# Patient Record
Sex: Female | Born: 1990 | Race: Black or African American | Hispanic: No | Marital: Single | Smoking: Former smoker
Health system: Southern US, Community
[De-identification: ages and names within clinical notes are randomized; demographics above are authoritative.]

## PROBLEM LIST (undated history)

## (undated) ENCOUNTER — Inpatient Hospital Stay (HOSPITAL_COMMUNITY): Payer: Self-pay

## (undated) ENCOUNTER — Inpatient Hospital Stay (HOSPITAL_COMMUNITY): Payer: 59

## (undated) DIAGNOSIS — D6800 Von Willebrand disease, unspecified: Secondary | ICD-10-CM

## (undated) DIAGNOSIS — D649 Anemia, unspecified: Secondary | ICD-10-CM

## (undated) DIAGNOSIS — G43909 Migraine, unspecified, not intractable, without status migrainosus: Secondary | ICD-10-CM

## (undated) DIAGNOSIS — G971 Other reaction to spinal and lumbar puncture: Secondary | ICD-10-CM

## (undated) DIAGNOSIS — S62309A Unspecified fracture of unspecified metacarpal bone, initial encounter for closed fracture: Secondary | ICD-10-CM

## (undated) DIAGNOSIS — O039 Complete or unspecified spontaneous abortion without complication: Secondary | ICD-10-CM

## (undated) DIAGNOSIS — F329 Major depressive disorder, single episode, unspecified: Secondary | ICD-10-CM

## (undated) DIAGNOSIS — I10 Essential (primary) hypertension: Secondary | ICD-10-CM

## (undated) DIAGNOSIS — F4310 Post-traumatic stress disorder, unspecified: Secondary | ICD-10-CM

## (undated) DIAGNOSIS — F419 Anxiety disorder, unspecified: Secondary | ICD-10-CM

## (undated) DIAGNOSIS — Z8614 Personal history of Methicillin resistant Staphylococcus aureus infection: Secondary | ICD-10-CM

## (undated) DIAGNOSIS — F32A Depression, unspecified: Secondary | ICD-10-CM

## (undated) DIAGNOSIS — D500 Iron deficiency anemia secondary to blood loss (chronic): Secondary | ICD-10-CM

## (undated) DIAGNOSIS — N921 Excessive and frequent menstruation with irregular cycle: Secondary | ICD-10-CM

## (undated) HISTORY — DX: Von Willebrand's disease: D68.0

## (undated) HISTORY — DX: Excessive and frequent menstruation with irregular cycle: N92.1

## (undated) HISTORY — PX: FRACTURE SURGERY: SHX138

## (undated) HISTORY — DX: Iron deficiency anemia secondary to blood loss (chronic): D50.0

---
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
## 2010-01-19 DIAGNOSIS — Z8614 Personal history of Methicillin resistant Staphylococcus aureus infection: Secondary | ICD-10-CM

## 2010-01-19 HISTORY — DX: Personal history of Methicillin resistant Staphylococcus aureus infection: Z86.14

## 2012-12-06 ENCOUNTER — Inpatient Hospital Stay (HOSPITAL_COMMUNITY): Payer: PRIVATE HEALTH INSURANCE

## 2012-12-06 ENCOUNTER — Encounter (HOSPITAL_COMMUNITY): Payer: Self-pay | Admitting: *Deleted

## 2012-12-06 ENCOUNTER — Inpatient Hospital Stay (HOSPITAL_COMMUNITY)
Admission: AD | Admit: 2012-12-06 | Discharge: 2012-12-06 | Disposition: A | Discharge status: Home / Self Care | Payer: Self-pay | Source: Ambulatory Visit | Attending: Obstetrics & Gynecology | Admitting: Obstetrics & Gynecology

## 2012-12-06 DIAGNOSIS — O209 Hemorrhage in early pregnancy, unspecified: Secondary | ICD-10-CM | POA: Insufficient documentation

## 2012-12-06 DIAGNOSIS — Z3490 Encounter for supervision of normal pregnancy, unspecified, unspecified trimester: Secondary | ICD-10-CM

## 2012-12-06 DIAGNOSIS — Z1389 Encounter for screening for other disorder: Secondary | ICD-10-CM

## 2012-12-06 DIAGNOSIS — B9689 Other specified bacterial agents as the cause of diseases classified elsewhere: Secondary | ICD-10-CM | POA: Insufficient documentation

## 2012-12-06 DIAGNOSIS — O2390 Unspecified genitourinary tract infection in pregnancy, unspecified trimester: Secondary | ICD-10-CM | POA: Insufficient documentation

## 2012-12-06 DIAGNOSIS — N760 Acute vaginitis: Secondary | ICD-10-CM

## 2012-12-06 DIAGNOSIS — A499 Bacterial infection, unspecified: Secondary | ICD-10-CM | POA: Insufficient documentation

## 2012-12-06 LAB — URINALYSIS, ROUTINE W REFLEX MICROSCOPIC
Bilirubin Urine: NEGATIVE
Glucose, UA: NEGATIVE mg/dL
Ketones, ur: NEGATIVE mg/dL
Leukocytes, UA: NEGATIVE
Nitrite: NEGATIVE
Protein, ur: NEGATIVE mg/dL

## 2012-12-06 LAB — CBC
Hemoglobin: 14.4 g/dL (ref 12.0–15.0)
MCH: 29.5 pg (ref 26.0–34.0)
MCHC: 34.7 g/dL (ref 30.0–36.0)
Platelets: 324 10*3/uL (ref 150–400)
RDW: 12.8 % (ref 11.5–15.5)
WBC: 13.4 10*3/uL — ABNORMAL HIGH (ref 4.0–10.5)

## 2012-12-06 LAB — WET PREP, GENITAL: Yeast Wet Prep HPF POC: NONE SEEN

## 2012-12-06 LAB — HCG, QUANTITATIVE, PREGNANCY: hCG, Beta Chain, Quant, S: 54001 m[IU]/mL — ABNORMAL HIGH (ref <5)

## 2012-12-06 LAB — POCT PREGNANCY, URINE: Preg Test, Ur: POSITIVE — AB

## 2012-12-06 LAB — ABO/RH: ABO/RH(D): O POS

## 2012-12-06 MED ORDER — CLINDAMYCIN PHOSPHATE 100 MG VA SUPP: 100.0000 mg | Freq: Every day | VAGINAL | Status: DC

## 2012-12-06 NOTE — MAU Provider Note (Signed)
History     CSN: [id number]  Arrival date and time: 12/06/12 1554   First Provider Initiated Contact with Patient 12/06/12 1656      Chief Complaint  Patient presents with  . Threatened Miscarriage   HPI  [name] is a 22 y.o. female G2P0010 at 10 weeks and 3 days who presents with vaginal bleeding that she experienced this past Saturday. She passed a large amount of blood and a quarter size clot. At the time she was bleeding she experienced no pain. She was seen at the health department today and they confirmed her pregnancy. She informed them of the bleeding on Saturday and they encouraged her to be seen here today. Currently she is having no pain and no bleeding.   OB History   Grav Para Term Preterm Abortions TAB SAB Ect Mult Living   2    1  1          History reviewed. No pertinent past medical history.  Past Surgical History  Procedure Laterality Date  . Dilation and curretage      Family History  Problem Relation Age of Onset  . Hypertension Mother   . Hypertension Father   . Diabetes Father   . Vision loss Father     History  Substance Use Topics  . Smoking status: Not on file  . Smokeless tobacco: Not on file  . Alcohol Use: Not on file    Allergies:  Allergies  Allergen Reactions  . Darvocet [Propoxyphene-Acetaminophen]     Pt states she starts shaking"real bad"  . Flagyl [Metronidazole] Hives    No prescriptions prior to admission   Results for orders placed during the hospital encounter of 12/06/12 (from the past 24 hour(s))  URINALYSIS, ROUTINE W REFLEX MICROSCOPIC     Status: None   Collection Time    12/06/12  4:30 PM      Result Value Range   Color, Urine YELLOW  YELLOW   APPearance CLEAR  CLEAR   Specific Gravity, Urine 1.020  1.005 - 1.030   pH 7.0  5.0 - 8.0   Glucose, UA NEGATIVE  NEGATIVE mg/dL   Hgb urine dipstick NEGATIVE  NEGATIVE   Bilirubin Urine NEGATIVE  NEGATIVE   Ketones, ur NEGATIVE  NEGATIVE mg/dL   Protein,  ur NEGATIVE  NEGATIVE mg/dL   Urobilinogen, UA 0.2  0.0 - 1.0 mg/dL   Nitrite NEGATIVE  NEGATIVE   Leukocytes, UA NEGATIVE  NEGATIVE  CBC     Status: Abnormal   Collection Time    12/06/12  5:04 PM      Result Value Range   WBC 13.4 (*) 4.0 - 10.5 K/uL   RBC 4.88  3.87 - 5.11 MIL/uL   Hemoglobin 14.4  12.0 - 15.0 g/dL   HCT 40.9  81.1 - 91.4 %   MCV 85.0  78.0 - 100.0 fL   MCH 29.5  26.0 - 34.0 pg   MCHC 34.7  30.0 - 36.0 g/dL   RDW 78.2  95.6 - 21.3 %   Platelets 324  150 - 400 K/uL  ABO/RH     Status: None   Collection Time    12/06/12  5:04 PM      Result Value Range   ABO/RH(D) O POS    HCG, QUANTITATIVE, PREGNANCY     Status: Abnormal   Collection Time    12/06/12  5:04 PM      Result Value Range   hCG, Beta Chain,  [place] [address] (*) <5 mIU/mL  POCT PREGNANCY, URINE     Status: Abnormal   Collection Time    12/06/12  5:52 PM      Result Value Range   Preg Test, Ur POSITIVE (*) NEGATIVE  WET PREP, GENITAL     Status: Abnormal   Collection Time    12/06/12  6:10 PM      Result Value Range   Yeast Wet Prep HPF POC NONE SEEN  NONE SEEN   Trich, Wet Prep NONE SEEN  NONE SEEN   Clue Cells Wet Prep HPF POC MODERATE (*) NONE SEEN   WBC, Wet Prep HPF POC FEW (*) NONE SEEN   US Ob Comp Less 14 Wks  12/06/2012   CLINICAL DATA:  Vaginal bleeding.  Positive pregnancy test.  EXAM: OBSTETRIC <14 WK [place] AND TRANSVAGINAL OB US  TECHNIQUE: Both transabdominal and transvaginal ultrasound examinations were performed for complete evaluation of the gestation as well as the maternal uterus, adnexal regions, and pelvic cul-de-sac. Transvaginal technique was performed to assess early pregnancy.  COMPARISON:  None.  FINDINGS: Intrauterine gestational sac: Visualized/normal in shape.  Yolk sac:  Not visualized  Embryo:  Visualized  Cardiac Activity: Visualized  Heart Rate:  170 bpm  CRL:   39  mm   10 w 6d                  [place] EDC: 06/28/13  Maternal uterus/adnexae: Ovaries are normal.  IMPRESSION:  Single live intrauterine gestation with concordant measurements by crown-rump length compared to assigned gestational age of [redacted] weeks 3 days by LMP, EDC by LMP 07/01/2013. No acute abnormality.   Electronically Signed   By: Christiana Pellant M.D.   On: 12/06/2012 17:50   US Ob Transvaginal  12/06/2012   CLINICAL DATA:  Vaginal bleeding.  Positive pregnancy test.  EXAM: OBSTETRIC <14 WK [place] AND TRANSVAGINAL OB US  TECHNIQUE: Both transabdominal and transvaginal ultrasound examinations were performed for complete evaluation of the gestation as well as the maternal uterus, adnexal regions, and pelvic cul-de-sac. Transvaginal technique was performed to assess early pregnancy.  COMPARISON:  None.  FINDINGS: Intrauterine gestational sac: Visualized/normal in shape.  Yolk sac:  Not visualized  Embryo:  Visualized  Cardiac Activity: Visualized  Heart Rate:  170 bpm  CRL:   39  mm   10 w 6d                  [place] EDC: 06/28/13  Maternal uterus/adnexae: Ovaries are normal.  IMPRESSION: Single live intrauterine gestation with concordant measurements by crown-rump length compared to assigned gestational age of [redacted] weeks 3 days by LMP, EDC by LMP 07/01/2013. No acute abnormality.   Electronically Signed   By: Christiana Pellant M.D.   On: 12/06/2012 17:50    Review of Systems  Constitutional: Negative for fever and chills.  Gastrointestinal: Negative for nausea, vomiting, abdominal pain, diarrhea and constipation.  Genitourinary: Negative for dysuria, urgency, frequency and hematuria.       No vaginal discharge. No vaginal bleeding. No dysuria.   Neurological: Negative for headaches.   Physical Exam   Blood pressure 132/80, pulse 75, temperature 98.8 F (37.1 C), temperature source Oral, resp. rate 20, height 5\' 6"  (1.676 m), weight 131.09 kg (289 lb), last menstrual period 09/24/2012.  Physical Exam  Constitutional: She appears well-developed and well-nourished. No distress.  HENT:  Head: Normocephalic.  Eyes:  Pupils are equal, round, and reactive to light.  Neck:  Neck supple.  Respiratory: Effort normal.  GI: Soft. She exhibits no distension. There is no tenderness. There is no rebound and no guarding.  Genitourinary: Vaginal discharge found.  Speculum exam: Vagina - moderate amount of creamy discharge, mild odor Cervix - scant contact bleeding Bimanual exam: Cervix closed Uterus non tender, normal size; gravid  Adnexa non tender, no masses bilaterally GC/Chlam, wet prep done Chaperone present for exam.   Skin: Skin is warm. She is not diaphoretic.    MAU Course  Procedures None   MDM O positive blood type.  Unable to doppler fetal heart tones by doppler.  [place] Beta Hcg   Assessment and Plan   A: 1. Normal IUP (intrauterine pregnancy) on prenatal ultrasound   2. BV (bacterial vaginosis)     P: Discharge home RX: Clindamycin- pt is allergic to flagyl  First trimester risks discussed Return to MAU as needed, if symptoms worsen Start prenatal care as soon as possible  Pelvic rest discussed   RASCH, JENNIFER IRENE NP  12/06/2012, 6:48 PM

## 2012-12-06 NOTE — MAU Note (Signed)
Pt states Saturday she passed a large blood clot, Pt states she is no longer bleeding and was seen in the health dept today and was told that she is still pregnant and should follow up here

## 2012-12-06 NOTE — MAU Note (Signed)
Started bleeding Sat night, passed 1 quartersized clot.  No further bleeding, no pain. Went to health dept today, was told still preg and sent here.

## 2013-01-19 ENCOUNTER — Encounter (HOSPITAL_COMMUNITY): Payer: Self-pay

## 2013-01-19 ENCOUNTER — Inpatient Hospital Stay (HOSPITAL_COMMUNITY)
Admission: AD | Admit: 2013-01-19 | Discharge: 2013-01-20 | Disposition: A | Discharge status: Home / Self Care | Payer: 59 | Source: Ambulatory Visit | Attending: Obstetrics & Gynecology | Admitting: Obstetrics & Gynecology

## 2013-01-19 ENCOUNTER — Inpatient Hospital Stay (HOSPITAL_COMMUNITY): Payer: 59

## 2013-01-19 DIAGNOSIS — O3430 Maternal care for cervical incompetence, unspecified trimester: Secondary | ICD-10-CM | POA: Diagnosis present

## 2013-01-19 DIAGNOSIS — R109 Unspecified abdominal pain: Secondary | ICD-10-CM | POA: Insufficient documentation

## 2013-01-19 DIAGNOSIS — O200 Threatened abortion: Secondary | ICD-10-CM | POA: Insufficient documentation

## 2013-01-19 LAB — WET PREP, GENITAL
TRICH WET PREP: NONE SEEN
Yeast Wet Prep HPF POC: NONE SEEN

## 2013-01-19 LAB — URINALYSIS, ROUTINE W REFLEX MICROSCOPIC
Bilirubin Urine: NEGATIVE
GLUCOSE, UA: NEGATIVE mg/dL
HGB URINE DIPSTICK: NEGATIVE
Ketones, ur: 15 mg/dL — AB
Leukocytes, UA: NEGATIVE
Nitrite: NEGATIVE
PROTEIN: NEGATIVE mg/dL
Specific Gravity, Urine: 1.02 (ref 1.005–1.030)
Urobilinogen, UA: 0.2 mg/dL (ref 0.0–1.0)
pH: 7 (ref 5.0–8.0)

## 2013-01-19 LAB — OB RESULTS CONSOLE GC/CHLAMYDIA
CHLAMYDIA, DNA PROBE: NEGATIVE
Gonorrhea: NEGATIVE

## 2013-01-19 NOTE — MAU Note (Signed)
Lower abdominal pain since getting off work today. Used the bathroom & felts "something" hanging from her vagina. Vaginal discharge x 3 weeks, white, no odor. Denies vaginal bleeding.

## 2013-01-19 NOTE — MAU Provider Note (Signed)
History     CSN: [id number]  Arrival date and time: 01/19/13 2226   First Provider Initiated Contact with Patient 01/19/13 2312      Chief Complaint  Patient presents with  . Abdominal Pain   HPI  Taylor Flowers is a 23 y.o. G2P0010 at [id number] who presents today with lower abdominal pain. She states that she has had pain off and on for the last few days, and she usually takes a tylenol and the pain is better. However today the pain did not get better with tylenol. She also states that when she had a BM earlier today she felt "something come out". She has been trying to schedule an appoointment with the HD, but has been unable. She would like to come to clinic if possible.   Past Medical History  Diagnosis Date  . Medical history non-contributory     Past Surgical History  Procedure Laterality Date  . Dilation and curretage      Family History  Problem Relation Age of Onset  . Hypertension Mother   . Hypertension Father   . Diabetes Father   . Vision loss Father     History  Substance Use Topics  . Smoking status: Never Smoker   . Smokeless tobacco: Not on file  . Alcohol Use: No    Allergies:  Allergies  Allergen Reactions  . Darvocet [Propoxyphene N-Acetaminophen]     Pt states she starts shaking"real bad"  . Flagyl [Metronidazole] Hives    Prescriptions prior to admission  Medication Sig Dispense Refill  . acetaminophen (TYLENOL) 325 MG tablet Take 650 mg by mouth every 6 (six) hours as needed.      . Prenatal Vit-Fe Fumarate-FA (PRENATAL MULTIVITAMIN) TABS tablet Take 1 tablet by mouth daily at 12 noon.        ROS Physical Exam   Blood pressure 147/92, pulse 111, resp. rate 18, height 5' 11.75" (1.822 m), weight 133.176 kg (293 lb 9.6 oz), last menstrual period 09/24/2012.  Physical Exam  Nursing note and vitals reviewed. Constitutional: She is oriented to person, place, and time. She appears well-developed and well-nourished. No distress.    Cardiovascular: Normal rate.   Respiratory: Effort normal.  GI: Soft. There is no tenderness.  Genitourinary:   External: no lesion Vagina: small amount of white discharge, membranes bulging into the vagina.  Cervix: unable to see Uterus: AGA, FHT with doppler    Neurological: She is alert and oriented to person, place, and time.  Skin: Skin is warm and dry.  Psychiatric: She has a normal mood and affect.    MAU Course  Procedures   Results for orders placed during the hospital encounter of 01/19/13 (from the past 24 hour(s))  URINALYSIS, ROUTINE W REFLEX MICROSCOPIC     Status: Abnormal   Collection Time    01/19/13 11:06 PM      Result Value Range   Color, Urine YELLOW  YELLOW   APPearance CLEAR  CLEAR   Specific Gravity, Urine 1.020  1.005 - 1.030   pH 7.0  5.0 - 8.0   Glucose, UA NEGATIVE  NEGATIVE mg/dL   Hgb urine dipstick NEGATIVE  NEGATIVE   Bilirubin Urine NEGATIVE  NEGATIVE   Ketones, ur 15 (*) NEGATIVE mg/dL   Protein, ur NEGATIVE  NEGATIVE mg/dL   Urobilinogen, UA 0.2  0.0 - 1.0 mg/dL   Nitrite NEGATIVE  NEGATIVE   Leukocytes, UA NEGATIVE  NEGATIVE  WET PREP, GENITAL     Status: Abnormal  Collection Time    01/19/13 11:13 PM      Result Value Range   Yeast Wet Prep HPF POC NONE SEEN  NONE SEEN   Trich, Wet Prep NONE SEEN  NONE SEEN   Clue Cells Wet Prep HPF POC FEW (*) NONE SEEN   WBC, Wet Prep HPF POC MODERATE (*) NONE SEEN   US Ob Limited  01/20/2013   OBSTETRICAL ULTRASOUND: This exam was performed within a [facility]. The OB US report was generated in the [facility] system, and faxed to the ordering physician.   This report is also available in [employer] and in the [employer]. See [facility] Obstetric US report.    2341: C/W Dr. Macon Large offer the patient medical completion of SAB or expectant management at home.   Discussed with the patient at length about R/B/A. Patient strongly desires to go home and try to  prolong the pregnancy. Reviewed bleeding precautions and what to expect if the miscarriage were to progress at home. Patient verbalizes understanding.   Dr. Macon Large notified of patients desires will have her FU in the clinic in 1 wee.   Assessment and Plan   1. Threatened abortion in second trimester    Bleeding precautions What to expect reviewed FU in the clinic in 1 week Return to MAU with heavy bleeding   Tawnya Crook 01/19/2013, 11:14 PM

## 2013-01-20 ENCOUNTER — Encounter (HOSPITAL_COMMUNITY): Payer: Self-pay | Admitting: *Deleted

## 2013-01-20 ENCOUNTER — Inpatient Hospital Stay (HOSPITAL_COMMUNITY)
Admission: AD | Admit: 2013-01-20 | Discharge: 2013-01-20 | Disposition: A | Discharge status: Home / Self Care | Payer: 59 | Source: Ambulatory Visit | Attending: Obstetrics and Gynecology | Admitting: Obstetrics and Gynecology

## 2013-01-20 DIAGNOSIS — O3432 Maternal care for cervical incompetence, second trimester: Secondary | ICD-10-CM

## 2013-01-20 DIAGNOSIS — O3430 Maternal care for cervical incompetence, unspecified trimester: Secondary | ICD-10-CM

## 2013-01-20 DIAGNOSIS — O200 Threatened abortion: Secondary | ICD-10-CM

## 2013-01-20 DIAGNOSIS — R109 Unspecified abdominal pain: Secondary | ICD-10-CM | POA: Insufficient documentation

## 2013-01-20 LAB — CBC
HCT: 37 % (ref 36.0–46.0)
Hemoglobin: 12.9 g/dL (ref 12.0–15.0)
MCH: 29.9 pg (ref 26.0–34.0)
MCHC: 34.9 g/dL (ref 30.0–36.0)
MCV: 85.6 fL (ref 78.0–100.0)
PLATELETS: 244 10*3/uL (ref 150–400)
RBC: 4.32 MIL/uL (ref 3.87–5.11)
RDW: 12.7 % (ref 11.5–15.5)
WBC: 12.7 10*3/uL — ABNORMAL HIGH (ref 4.0–10.5)

## 2013-01-20 NOTE — Discharge Instructions (Signed)
Your cervix is too far dilated to place a cerclage or for you to get any benefit from progesterone treatment. Stay on bedrest at home with your hips raised to attempt to reduce the pressure on your cervix. [facility]aternal [facility]etal Care will recheck your cervix at an appointment next week to see if they can place a cerclage at that time.   Cervical Insufficiency  Cervical insufficiency is when the cervix is weak and starts to open (dilate) and thin (efface) before the pregnancy is at term and without labor starting. This is also called incompetent cervix. It can happen in the second or third trimester when the fetus starts putting pressure on the cervix. Cervical insufficiency can lead to a miscarriage, preterm premature rupture of the membranes (PPROM), or having the baby early (preterm birth).  RISK FACTORS You may be more likely to develop cervical insufficiency if:  You have a shorter cervix than normal.  Damage or injury occurred to your cervix from a past pregnancy or surgery.  You were born with a cervical defect.  You have had procedure done on the cervix, such as cervical biopsy.  You have a history of cervical insufficiency.  You have a history of PPROM.  You have ended several past pregnancies through abortion.  You were exposed to the drug diethylstilbestrol (DES). SYMPTOMS Often times, women do not have any symptoms. Other times, woman may only have mild symptoms that often start between week 14 through 20. The symptoms may last several days or weeks. These symptoms include:  Light spotting or bleeding from the vagina.  Pelvic pressure.  A change in vaginal discharge, such as discharge that changes from clear, white, or light yellow to pink or tan.  Back pain.  Abdominal pain or cramping. DIAGNOSIS Cervical insufficiency cannot be diagnosed before you become pregnant. Once you are pregnant, your caregiver will ask about your medical history and if you have had any problems in  past pregnancies. Tell your caregiver about any procedures performed on your cervix or if you have a history of miscarriages or cervical insufficiency. If your caregiver thinks you are at high risk for cervical insufficiency or show signs of cervical insufficiency, he or she may:  Perform a pelvic exam. This will check for:  The presence of the membranes (amniotic sac) coming out of the cervix.  Cervical abnormalities.  Cervical injuries.  The presence of contractions.  Perform an ultrasonography (commonly called ultrasound) to measure the length and thickness of the cervix. TREATMENT If you have been diagnosed with cervical insufficiency, your caregiver may recommend:  Limiting physical activity.  Bed rest at home or in the hospital. Place a pillow under your hips to take the pressure off of your cervix.   Pelvic rest, which means no sexual intercourse or placing anything in the vagina.  Cerclage to sew the cervix closed and prevent it from opening too early. The stitches (sutures) are removed between weeks 36 and 38 to avoid problems during labor. Cerclage may be recommended during pregnancy if you have had a history of miscarriages or preterm births without a known cause. It may also be recommended if you have a short cervix that was identified by ultrasound or if your caregiver has found that your cervix has dilated before 24 weeks of pregnancy. Limiting physical activity and bed rest may or may not help prevent a preterm birth. WHEN SHOULD YOU SEEK IMMEDIATE MEDICAL CARE?  Seek immediate medical care if you show any symptoms of cervical insufficiency. You  will need to go to the hospital to get checked immediately. Document Released: 01/05/2005 Document Revised: 09/07/2012 Document Reviewed: 03/14/2012 [facility] Patient Information 2014 [place], [place].   Pelvic Rest Pelvic rest is sometimes recommended for women when:   The placenta is partially or completely covering the opening  of the cervix (placenta previa).  There is bleeding between the uterine wall and the amniotic sac in the first trimester (subchorionic hemorrhage).  The cervix begins to open without labor starting (incompetent cervix, cervical insufficiency).  The labor is too early (preterm labor). HOME CARE INSTRUCTIONS  Do not have sexual intercourse, stimulation, or an orgasm.  Do not use tampons, douche, or put anything in the vagina.  Do not lift anything over 10 pounds (4.5 kg).  Avoid strenuous activity or straining your pelvic muscles. SEEK MEDICAL CARE IF:  You have any vaginal bleeding during pregnancy. Treat this as a potential emergency.  You have cramping pain felt low in the stomach (stronger than menstrual cramps).  You notice vaginal discharge (watery, mucus, or bloody).  You have a low, dull backache.  There are regular contractions or uterine tightening. SEEK IMMEDIATE MEDICAL CARE IF: You have vaginal bleeding and have placenta previa.  Document Released: 05/02/2010 Document Revised: 03/30/2011 Document Reviewed: 05/02/2010 [facility] Patient Information 2014 [place], [place].

## 2013-01-20 NOTE — MAU Provider Note (Signed)
Attestation of Attending Supervision of Advanced Practitioner (CNM/NP): Evaluation and management procedures were performed by the Advanced Practitioner under my supervision and collaboration.  I have reviewed the Advanced Practitioner's note and chart, and I agree with the management and plan.   Plan of care established by Dr. Macon LargeAnyanwu was reviewed and explained again to the patient. Patient now understands that at this time she is not a candidate for any intervention given her advance cervical dilation. Patient desires expectant management and agrees to bedrest at home and to follow up with MFM next week. Taylor Flowers 01/20/2013 10:43 PM

## 2013-01-20 NOTE — Discharge Instructions (Signed)
Cervical Insufficiency  Cervical insufficiency is when the cervix is weak and starts to open (dilate) and thin (efface) before the pregnancy is at term and without labor starting. This is also called incompetent cervix. It can happen in the second or third trimester when the fetus starts putting pressure on the cervix. Cervical insufficiency can lead to a miscarriage, preterm premature rupture of the membranes (PPROM), or having the baby early (preterm birth).  RISK FACTORS You may be more likely to develop cervical insufficiency if:  You have a shorter cervix than normal.  Damage or injury occurred to your cervix from a past pregnancy or surgery.  You were born with a cervical defect.  You have had procedure done on the cervix, such as cervical biopsy.  You have a history of cervical insufficiency.  You have a history of PPROM.  You have ended several past pregnancies through abortion.  You were exposed to the drug diethylstilbestrol (DES). SYMPTOMS Often times, women do not have any symptoms. Other times, woman may only have mild symptoms that often start between week 14 through 20. The symptoms may last several days or weeks. These symptoms include:  Light spotting or bleeding from the vagina.  Pelvic pressure.  A change in vaginal discharge, such as discharge that changes from clear, white, or light yellow to pink or tan.  Back pain.  Abdominal pain or cramping. DIAGNOSIS Cervical insufficiency cannot be diagnosed before you become pregnant. Once you are pregnant, your caregiver will ask about your medical history and if you have had any problems in past pregnancies. Tell your caregiver about any procedures performed on your cervix or if you have a history of miscarriages or cervical insufficiency. If your caregiver thinks you are at high risk for cervical insufficiency or show signs of cervical insufficiency, he or she may:  Perform a pelvic exam. This will check for:  The  presence of the membranes (amniotic sac) coming out of the cervix.  Cervical abnormalities.  Cervical injuries.  The presence of contractions.  Perform an ultrasonography (commonly called ultrasound) to measure the length and thickness of the cervix. TREATMENT If you have been diagnosed with cervical insufficiency, your caregiver may recommend:  Limiting physical activity.  Bed rest at home or in the hospital.  Pelvic rest, which means no sexual intercourse or placing anything in the vagina.  Cerclage to sew the cervix closed and prevent it from opening too early. The stitches (sutures) are removed between weeks 36 and 38 to avoid problems during labor. Cerclage may be recommended during pregnancy if you have had a history of miscarriages or preterm births without a known cause. It may also be recommended if you have a short cervix that was identified by ultrasound or if your caregiver has found that your cervix has dilated before 24 weeks of pregnancy. Limiting physical activity and bed rest may or may not help prevent a preterm birth. WHEN SHOULD YOU SEEK IMMEDIATE MEDICAL CARE?  Seek immediate medical care if you show any symptoms of cervical insufficiency. You will need to go to the hospital to get checked immediately. Document Released: 01/05/2005 Document Revised: 09/07/2012 Document Reviewed: 03/14/2012 [facility] Patient Information 2014 [employer] [place].  Miscarriage A miscarriage is the sudden loss of an unborn baby (fetus) before the 20th week of pregnancy. Most miscarriages happen in the first 3 months of pregnancy. Sometimes, it happens before a woman even knows she is pregnant. A miscarriage is also called a "spontaneous miscarriage" or "early pregnancy loss."  Having a miscarriage can be an emotional experience. Talk with your caregiver about any questions you may have about miscarrying, the grieving process, and your future pregnancy plans. CAUSES   Problems with the fetal  chromosomes that make it impossible for the baby to develop normally. Problems with the baby's genes or chromosomes are most often the result of errors that occur, by chance, as the embryo divides and grows. The problems are not inherited from the parents.  Infection of the cervix or uterus.   Hormone problems.   Problems with the cervix, such as having an incompetent cervix. This is when the tissue in the cervix is not strong enough to hold the pregnancy.   Problems with the uterus, such as an abnormally shaped uterus, uterine fibroids, or congenital abnormalities.   Certain medical conditions.   Smoking, drinking alcohol, or taking illegal drugs.   Trauma.  Often, the cause of a miscarriage is unknown.  SYMPTOMS   Vaginal bleeding or spotting, with or without cramps or pain.  Pain or cramping in the abdomen or lower back.  Passing fluid, tissue, or blood clots from the vagina. DIAGNOSIS  Your caregiver will perform a physical exam. You may also have an ultrasound to confirm the miscarriage. Blood or urine tests may also be ordered. TREATMENT   Sometimes, treatment is not necessary if you naturally pass all the fetal tissue that was in the uterus. If some of the fetus or placenta remains in the body (incomplete miscarriage), tissue left behind may become infected and must be removed. Usually, a dilation and curettage (D and C) procedure is performed. During a D and C procedure, the cervix is widened (dilated) and any remaining fetal or placental tissue is gently removed from the uterus.  Antibiotic medicines are prescribed if there is an infection. Other medicines may be given to reduce the size of the uterus (contract) if there is a lot of bleeding.  If you have Rh negative blood and your baby was Rh positive, you will need a Rh immunoglobulin shot. This shot will protect any future baby from having Rh blood problems in future pregnancies. HOME CARE INSTRUCTIONS   Your  caregiver may order bed rest or may allow you to continue light activity. Resume activity as directed by your caregiver.  Have someone help with home and family responsibilities during this time.   Keep track of the number of sanitary pads you use each day and how soaked (saturated) they are. Write down this information.   Do not use tampons. Do not douche or have sexual intercourse until approved by your caregiver.   Only take over-the-counter or prescription medicines for pain or discomfort as directed by your caregiver.   Do not take aspirin. Aspirin can cause bleeding.   Keep all follow-up appointments with your caregiver.   If you or your partner have problems with grieving, talk to your caregiver or seek counseling to help cope with the pregnancy loss. Allow enough time to grieve before trying to get pregnant again.  SEEK IMMEDIATE MEDICAL CARE IF:   You have severe cramps or pain in your back or abdomen.  You have a fever.  You pass large blood clots (walnut-sized or larger) ortissue from your vagina. Save any tissue for your caregiver to inspect.   Your bleeding increases.   You have a thick, bad-smelling vaginal discharge.  You become lightheaded, weak, or you faint.   You have chills.  MAKE SURE YOU:  Understand these instructions.  Will watch your condition.  Will get help right away if you are not doing well or get worse. Document Released: 07/01/2000 Document Revised: 05/02/2012 Document Reviewed: 02/24/2011 [facility] Patient Information 2014 [place], [place].

## 2013-01-20 NOTE — MAU Note (Signed)
Increased pressure and mild cramping since leaking last night. No pain. Was dx with incompetent cervix and prolapsing membranes.

## 2013-01-20 NOTE — MAU Provider Note (Signed)
Chief Complaint: pelvic pressure    First Provider Initiated Contact with Patient 01/20/13 1249      SUBJECTIVE HPI: Taylor FarrierRaquel Flowers is a 23 y.o. G2P0010 at 8433w6d by LMP who presents with questions about management of incompetent cervix and increased low abd pressure and pink discharge since being seen in MAU last night. Took Tylenol for discomfort w/ good relief. Cervix was 3 cm, no measurable length w/ BBOW. Offered expectant management at home on bedrest vs Cytotec for extremely high rist of SAB. Not a candidate for cerclage due to cervix. Asking if she should be Rx'd progesterone, why she can't get a cerclage. Dr. Macon LargeAnyanwu documented discussion about management options at visit last night and why pt was not candidate for cerclage.   No Hx PTD. [facility] No Hx LEEP, cryo. D&C x 1.   Past Medical History  Diagnosis Date  . Medical history non-contributory    OB History  Gravida Para Term Preterm AB SAB TAB Ectopic Multiple Living  2    1 1         # Outcome Date GA Lbr Len/2nd Weight Sex Delivery Anes PTL Lv  2 CUR           1 SAB              Past Surgical History  Procedure Laterality Date  . Dilation and curretage     History   Social History  . Marital Status: Single    Spouse Name: N/A    Number of Children: N/A  . Years of Education: N/A   Occupational History  . Not on file.   Social History Main Topics  . Smoking status: Never Smoker   . Smokeless tobacco: Not on file  . Alcohol Use: No  . Drug Use: No  . Sexual Activity: Yes   Other Topics Concern  . Not on file   Social History Narrative  . No narrative on file   No current facility-administered medications on file prior to encounter.   Current Outpatient Prescriptions on File Prior to Encounter  Medication Sig Dispense Refill  . acetaminophen (TYLENOL) 325 MG tablet Take 650 mg by mouth every 6 (six) hours as needed.      . Prenatal Vit-Fe Fumarate-FA (PRENATAL MULTIVITAMIN) TABS tablet Take 1  tablet by mouth daily at 12 noon.       Allergies  Allergen Reactions  . Darvocet [Propoxyphene N-Acetaminophen]     Pt states she starts shaking"real bad"  . Flagyl [Metronidazole] Hives    ROS: Pertinent items in HPI. Neg for fever, chills, cramping, LOF, urinary complaints, GI complaints.   OBJECTIVE Blood pressure 134/79, pulse 109, temperature 99 F (37.2 C), temperature source Oral, resp. rate 18, height 5' 10.5" (1.791 m), weight 132.45 kg (292 lb), last menstrual period 09/24/2012. GENERAL: Well-developed, well-nourished female in no acute distress.  HEENT: Normocephalic HEART: normal rate RESP: normal effort ABDOMEN: Soft, non-tender.  EXTREMITIES: Nontender, no edema NEURO: Alert and oriented SPECULUM EXAM: NEFG, small amount of thin, pink, odorless discharge, BBOW, cervix not visible, but exam limited by CNM limiting pressure during exam. BIMANUAL: BBOW palpated. UTA dilation aroung BOW; uterus 17-18 week size, no adnexal tenderness or masses  LAB RESULTS Results for orders placed during the hospital encounter of 01/20/13 (from the past 24 hour(s))  CBC     Status: Abnormal   Collection Time    01/20/13  1:05 PM      Result Value Range  WBC 12.7 (*) 4.0 - 10.5 K/uL   RBC 4.32  3.87 - 5.11 MIL/uL   Hemoglobin 12.9  12.0 - 15.0 g/dL   HCT 16.1  09.6 - 04.5 %   MCV 85.6  78.0 - 100.0 fL   MCH 29.9  26.0 - 34.0 pg   MCHC 34.9  30.0 - 36.0 g/dL   RDW 40.9  81.1 - 91.4 %   Platelets 244  150 - 400 K/uL    IMAGING   MAU COURSE CBC, Trendelenburg.  Dr. Jolayne Panther came down to discuss very limited options for pt due to advanced dilation and BOW in vagina. Again explained cerclage not possible at this time due to cervical exam and no benefit to progesterone IM or PV with her Hx and advanced dilation. Pt verbalized understanding.  Pt's mother came to room and options repeated. Pt reports significantly decreased low abd pressure in [place].   ASSESSMENT 1. Cervical  incompetence, antepartum, second trimester    PLAN Discharge home in stable maternal condition. Fetus high-risk for SAB.  Your cervix is too far dilated to place a cerclage or for you to get any benefit from progesterone treatment. Stay on bedrest at home with your hips raised to attempt to reduce the pressure on your cervix. [facility]aternal [facility]etal Care will recheck your cervix at an appointment next week to see if they can place a cerclage at that time.  Discussed need for early [facility] in future pregnancies for early cerclage placement.  MFM appt requested.      Follow-up Information   Follow up with [facility]. (will call you to schedule appointment)    Specialty:  Maternal and Fetal Medicine   Contact information:   [address] [id number] [place] [address] [phone]      Follow up with [facility] [place] [facility]ERN[facility] ADMISS[facility]. (fever greater than 100.4, broken water, increased bleeding or worsening pain. )    Contact information:   [address] [id number] [place] [address] [phone]       Medication List    ASK your doctor about these medications       acetaminophen 325 MG tablet  Commonly known as:  TYLENOL  Take 650 mg by mouth every 6 (six) hours as needed.     prenatal multivitamin Tabs tablet  Take 1 tablet by mouth daily at 12 noon.       [place], [place] 01/20/2013  2:07 PM

## 2013-01-20 NOTE — MAU Provider Note (Signed)
Attestation of Attending Supervision of Advanced Practitioner (PA/CNM/NP): Evaluation and management procedures were performed by the Advanced Practitioner under my supervision and collaboration.  I have reviewed the Advanced Practitioner's note and chart, and I agree with the management and plan.  Patient requested to talk to me personally and I also explained that her options at this point were expectant management or delivery.  She desires to "do everything to save this pregnancy" and desires a cerclage.  Given that she has bulging membranes, cerclage cannot be done now as there is no measurable cervix and an increased risk of infection.  She will be on bedrest and return to be reevaluated next week by MFM to see if there could be any measurable cervix, and also ensure there is no infection prior to cerclage being considered.  She was also given the option to be seen at a tertiary institution for a second opinion.  Patient understands the poor prognosis associated with prolapsed membranes at this GA.  Bleeding, PPROM, PTL infection precautions reviewed in detail. Message will be sent to MFM for an outpatient referral and ultrasound.  Jaynie CollinsUGONNA  Josefine Fuhr, MD, FACOG Attending Obstetrician & Gynecologist Faculty Practice, [facility]

## 2013-01-21 ENCOUNTER — Encounter (HOSPITAL_COMMUNITY): Payer: Self-pay

## 2013-01-21 ENCOUNTER — Observation Stay (HOSPITAL_COMMUNITY)
Admission: AD | Admit: 2013-01-21 | Discharge: 2013-01-23 | Disposition: A | Discharge status: Home / Self Care | Payer: 59 | Source: Ambulatory Visit | Attending: Family Medicine | Admitting: Family Medicine

## 2013-01-21 DIAGNOSIS — O3430 Maternal care for cervical incompetence, unspecified trimester: Secondary | ICD-10-CM | POA: Insufficient documentation

## 2013-01-21 DIAGNOSIS — O4290 Premature rupture of membranes, unspecified as to length of time between rupture and onset of labor, unspecified weeks of gestation: Secondary | ICD-10-CM

## 2013-01-21 DIAGNOSIS — O3432 Maternal care for cervical incompetence, second trimester: Secondary | ICD-10-CM

## 2013-01-21 HISTORY — DX: Premature rupture of membranes, unspecified as to length of time between rupture and onset of labor, unspecified weeks of gestation: O42.90

## 2013-01-21 MED ORDER — ZOLPIDEM TARTRATE 5 MG PO TABS: 5.0000 mg | ORAL_TABLET | Freq: Every evening | ORAL | Status: DC | PRN

## 2013-01-21 MED ORDER — NALBUPHINE HCL 10 MG/ML IJ SOLN
10.0000 mg | Freq: Once | INTRAMUSCULAR | Status: AC
  Administered 2013-01-21: 10 mg via INTRAMUSCULAR
  Filled 2013-01-21: qty 1

## 2013-01-21 MED ORDER — PRENATAL MULTIVITAMIN CH
1.0000 | ORAL_TABLET | Freq: Every day | ORAL | Status: DC
  Administered 2013-01-22 – 2013-01-23 (×2): 1 via ORAL
  Filled 2013-01-21 (×3): qty 1

## 2013-01-21 MED ORDER — DOCUSATE SODIUM 100 MG PO CAPS
100.0000 mg | ORAL_CAPSULE | Freq: Every day | ORAL | Status: DC
  Administered 2013-01-21 – 2013-01-23 (×3): 100 mg via ORAL
  Filled 2013-01-21 (×5): qty 1

## 2013-01-21 MED ORDER — NALBUPHINE HCL 10 MG/ML IJ SOLN: 10.0000 mg | INTRAMUSCULAR | Status: DC | PRN

## 2013-01-21 MED ORDER — CALCIUM CARBONATE ANTACID 500 MG PO CHEW
2.0000 | CHEWABLE_TABLET | ORAL | Status: DC | PRN
  Filled 2013-01-21: qty 2

## 2013-01-21 NOTE — MAU Note (Signed)
Patient states she has had leaking of clear fluid since 1100. Thought it was urine but has a different smell. States she put her finger in the vagina and felt something coming out. Patient denies pain or bleeding.

## 2013-01-21 NOTE — Progress Notes (Signed)
Patient c/o vaginal bleeding but no cramping.Small amt of mucus like bright red bleeding noted in urine pan.

## 2013-01-21 NOTE — MAU Note (Signed)
House coverage called for assistance in finding a room to admit the pt to.

## 2013-01-21 NOTE — Progress Notes (Signed)
Patient was given Nubain 10 mg IM Rt gluteal for c/o cramping of a "7" and the pain has come down to a "0".

## 2013-01-21 NOTE — Progress Notes (Signed)
Patient now complaining of lower abdominal cramping that she rates at a "7".Taylor Flowers CNM notified for Dr Shawnie PonsPratt and patient to get IM Nubain 10 mg.

## 2013-01-21 NOTE — H&P (Signed)
  Taylor Flowers is an 23 y.o. G2P0010 736w0d female.   Chief Complaint: PPROM HPI: Pt. Here for 3rd day in a row with BBOW and now constant LOF.  Reports leakage started today.  Interested in doing everything to prolong pregnancy. Counseled, at length, by Dr. Macon LargeAnyanwu and continues to desire everything to be done to save the baby. Denies fever, chills, bleeding.  Past Medical History  Diagnosis Date  . Medical history non-contributory     Past Surgical History  Procedure Laterality Date  . Dilation and curretage      Family History  Problem Relation Age of Onset  . Hypertension Mother   . Hypertension Father   . Diabetes Father   . Vision loss Father    Social History:  reports that she has never smoked. She does not have any smokeless tobacco history on file. She reports that she does not drink alcohol or use illicit drugs.  Allergies:  Allergies  Allergen Reactions  . Darvocet [Propoxyphene N-Acetaminophen] Other (See Comments)    Pt states she starts shaking"real bad"  . Flagyl [Metronidazole] Hives    Medications Prior to Admission  Medication Sig Dispense Refill  . acetaminophen (TYLENOL) 325 MG tablet Take 650 mg by mouth every 6 (six) hours as needed for moderate pain.       . Prenatal Vit-Fe Fumarate-FA (PRENATAL MULTIVITAMIN) TABS tablet Take 1 tablet by mouth daily at 12 noon.         Pertinent items are noted in HPI.  Blood pressure 127/90, pulse 113, temperature 98.5 F (36.9 C), resp. rate 20, last menstrual period 09/24/2012. BP 127/90  Pulse 113  Temp(Src) 98.5 F (36.9 C)  Resp 20  LMP 09/24/2012 General appearance: alert, cooperative and appears stated age Head: Normocephalic, without obvious abnormality, atraumatic Lungs: clear to auscultation bilaterally Heart: regular rate and rhythm, S1, S2 normal, no murmur, click, rub or gallop Abdomen: soft, non-tender; bowel sounds normal; no masses,  no organomegaly Pelvic: external genitalia normal and  uterus is enlarged, non-tender.  There is clear fluid pooling.  BBOW is again seen. Extremities: extremities normal, atraumatic, no cyanosis or edema Pulses: 2+ and symmetric Skin: Skin color, texture, turgor normal. No rashes or lesions Lymph nodes: Cervical, supraclavicular, and axillary nodes normal. Neurologic: Grossly normal   Lab Results  Component Value Date   WBC 12.7* 01/20/2013   HGB 12.9 01/20/2013   HCT 37.0 01/20/2013   MCV 85.6 01/20/2013   PLT 244 01/20/2013   Lab Results  Component Value Date   PREGTESTUR POSITIVE* 12/06/2012     Assessment/Plan Patient Active Problem List   Diagnosis Date Noted  . Cervical incompetence, antepartum 01/20/2013  Pre-viable PPROM Admit x 23 hours to ensure delivery will not happen immediately.   Nahun Kronberg S 01/21/2013, 5:08 PM

## 2013-01-22 LAB — GC/CHLAMYDIA PROBE AMP
CT PROBE, AMP APTIMA: NEGATIVE
GC Probe RNA: NEGATIVE

## 2013-01-22 MED ORDER — ASPIRIN-ACETAMINOPHEN-CAFFEINE 250-250-65 MG PO TABS: 2.0000 | ORAL_TABLET | Freq: Once | ORAL | Status: DC

## 2013-01-22 MED ORDER — BUTALBITAL-APAP-CAFFEINE 50-325-40 MG PO TABS
1.0000 | ORAL_TABLET | ORAL | Status: AC
  Administered 2013-01-22: 1 via ORAL
  Filled 2013-01-22: qty 1

## 2013-01-22 NOTE — Progress Notes (Signed)
Subjective: Patient reports tolerating PO.  Had cramping and spotting last pm, but none since a one time dose of IM Nubain.  Still leaking clear fluid.  No further bleeding.   Objective: I have reviewed patient's vital signs, medications and labs.  General: alert, cooperative and appears stated age GI: soft, non-tender; bowel sounds normal; no masses,  no organomegaly and fundus is non-tender Extremities: extremities normal, atraumatic, no cyanosis or edema Vaginal Bleeding: minimal  CBC    Component Value Date/Time   WBC 12.7* 01/20/2013 1305   RBC 4.32 01/20/2013 1305   HGB 12.9 01/20/2013 1305   HCT 37.0 01/20/2013 1305   PLT 244 01/20/2013 1305   MCV 85.6 01/20/2013 1305   MCH 29.9 01/20/2013 1305   MCHC 34.9 01/20/2013 1305   RDW 12.7 01/20/2013 1305   Assessment/Plan: Pre-viable PPROM--pt. Continues to desire expectant management.  All options have been discussed with the pt.  She understands risks of continuing pregnancy and option of termination. Observe today--if no further s/sx's of labor--consider d/c later today or tomorrow am.  Delivery with s/sx's chorio.   LOS: 1 day    Tegan Britain S 01/22/2013, 7:10 AM

## 2013-01-22 NOTE — Progress Notes (Signed)
No further cramping per patient,Last peri pad only had a dime size spot on it.FHR via Doppler 146 this AM.

## 2013-01-22 NOTE — Plan of Care (Signed)
Problem: Phase I Progression Outcomes Goal: OOB as tolerated unless otherwise ordered Outcome: Completed/Met Date Met:  01/22/13 BRP only

## 2013-01-22 NOTE — Plan of Care (Signed)
Problem: Phase I Progression Outcomes Goal: Bowel movement every 2 days Outcome: Completed/Met Date Met:  01/22/13 BM 01/22/13     

## 2013-01-23 ENCOUNTER — Inpatient Hospital Stay (HOSPITAL_COMMUNITY): Payer: 59

## 2013-01-23 MED ORDER — DSS 100 MG PO CAPS: 100.0000 mg | ORAL_CAPSULE | Freq: Every day | ORAL | Status: DC

## 2013-01-23 MED ORDER — ACETAMINOPHEN 325 MG PO TABS: 650.0000 mg | ORAL_TABLET | Freq: Every day | ORAL | Status: DC | PRN

## 2013-01-23 NOTE — Discharge Instructions (Signed)
Take your Temperature twice a day Come to Hospital if >100.3   Pelvic Rest Pelvic rest is sometimes recommended for women when:   The placenta is partially or completely covering the opening of the cervix (placenta previa).  There is bleeding between the uterine wall and the amniotic sac in the first trimester (subchorionic hemorrhage).  The cervix begins to open without labor starting (incompetent cervix, cervical insufficiency).  The labor is too early (preterm labor). HOME CARE INSTRUCTIONS  Do not have sexual intercourse, stimulation, or an orgasm.  Do not use tampons, douche, or put anything in the vagina.  Do not lift anything over 10 pounds (4.5 kg).  Avoid strenuous activity or straining your pelvic muscles. SEEK MEDICAL CARE IF:  You have any vaginal bleeding during pregnancy. Treat this as a potential emergency.  You have cramping pain felt low in the stomach (stronger than menstrual cramps).  You notice vaginal discharge (watery, mucus, or bloody).  You have a low, dull backache.  There are regular contractions or uterine tightening. SEEK IMMEDIATE MEDICAL CARE IF: You have vaginal bleeding and have placenta previa.  Document Released: 05/02/2010 Document Revised: 03/30/2011 Document Reviewed: 05/02/2010 [facility] Patient Information 2014 [place], [place].  Cervical Insufficiency  Cervical insufficiency is when the cervix is weak and starts to open (dilate) and thin (efface) before the pregnancy is at term and without labor starting. This is also called incompetent cervix. It can happen in the second or third trimester when the fetus starts putting pressure on the cervix. Cervical insufficiency can lead to a miscarriage, preterm premature rupture of the membranes (PPROM), or having the baby early (preterm birth).  RISK FACTORS You may be more likely to develop cervical insufficiency if:  You have a shorter cervix than normal.  Damage or injury occurred to your  cervix from a past pregnancy or surgery.  You were born with a cervical defect.  You have had procedure done on the cervix, such as cervical biopsy.  You have a history of cervical insufficiency.  You have a history of PPROM.  You have ended several past pregnancies through abortion.  You were exposed to the drug diethylstilbestrol (DES). SYMPTOMS Often times, women do not have any symptoms. Other times, woman may only have mild symptoms that often start between week 14 through 20. The symptoms may last several days or weeks. These symptoms include:  Light spotting or bleeding from the vagina.  Pelvic pressure.  A change in vaginal discharge, such as discharge that changes from clear, white, or light yellow to pink or tan.  Back pain.  Abdominal pain or cramping. DIAGNOSIS Cervical insufficiency cannot be diagnosed before you become pregnant. Once you are pregnant, your caregiver will ask about your medical history and if you have had any problems in past pregnancies. Tell your caregiver about any procedures performed on your cervix or if you have a history of miscarriages or cervical insufficiency. If your caregiver thinks you are at high risk for cervical insufficiency or show signs of cervical insufficiency, he or she may:  Perform a pelvic exam. This will check for:  The presence of the membranes (amniotic sac) coming out of the cervix.  Cervical abnormalities.  Cervical injuries.  The presence of contractions.  Perform an ultrasonography (commonly called ultrasound) to measure the length and thickness of the cervix. TREATMENT If you have been diagnosed with cervical insufficiency, your caregiver may recommend:  Limiting physical activity.  Bed rest at home or in the hospital.  Pelvic  rest, which means no sexual intercourse or placing anything in the vagina.  Cerclage to sew the cervix closed and prevent it from opening too early. The stitches (sutures) are removed  between weeks 36 and 38 to avoid problems during labor. Cerclage may be recommended during pregnancy if you have had a history of miscarriages or preterm births without a known cause. It may also be recommended if you have a short cervix that was identified by ultrasound or if your caregiver has found that your cervix has dilated before 24 weeks of pregnancy. Limiting physical activity and bed rest may or may not help prevent a preterm birth. WHEN SHOULD YOU SEEK IMMEDIATE MEDICAL CARE?  Seek immediate medical care if you show any symptoms of cervical insufficiency. You will need to go to the hospital to get checked immediately. Document Released: 01/05/2005 Document Revised: 09/07/2012 Document Reviewed: 03/14/2012 [facility] Patient Information 2014 [place].

## 2013-01-23 NOTE — Progress Notes (Signed)
Ur chart review completed.  

## 2013-01-23 NOTE — Discharge Summary (Signed)
Physician Discharge Summary  Patient ID: Taylor FarrierRaquel Flowers MRN: [id number] DOB/AGE: 24/07/1990 22 y.o.  Admit date: 01/21/2013 Discharge date: 01/23/2013  Admission Diagnoses:  Previable PPROM   Discharge Diagnoses: Previable PPROM Principal Problem:   Premature rupture of membranes in pregnancy, antepartum-at 17 wks  Discharged Condition: good  Hospital Course: Pt was admitted 2 days ago with previable PPROM.  Pt had a bulging bag on exam.  Pt was counseled extensively on options by Dr. Shawnie PonsPratt.  Pt decided to remain pregnant and wants everyting done for pregnancy.  Pt had a small episode of bleeding on the January 3rd so she was kept to see if delivery was imminent.  Pt has had no bleeding and no fever.  Pt had one migraine heaache which was relieved with Fioricet.  Consults: None  Significant Diagnostic Studies: radiology: Ultrasound: fluid check before discharge  Treatments: observation, analgesia for headache  Discharge Exam: Blood pressure 99/63, pulse 89, temperature 98.5 F (36.9 C), temperature source Oral, resp. rate 16, height 5' 10.5" (1.791 m), weight 292 lb (132.45 kg), last menstrual period 09/24/2012, SpO2 98.00%. General appearance: alert, cooperative and no distress GI: soft, non-tender; bowel sounds normal; no masses,  no organomegaly Extremities: Homans sign is negative, no sign of DVT and no edema, redness or tenderness in the calves or thighs  Disposition: 01-Home or Self Care     Medication List         acetaminophen 325 MG tablet  Commonly known as:  TYLENOL  Take 2 tablets (650 mg total) by mouth daily as needed for moderate pain or headache.     DSS 100 MG Caps  Take 100 mg by mouth daily.     prenatal multivitamin Tabs tablet  Take 1 tablet by mouth daily at 12 noon.           Follow-up Information   [facility] Schedule an appointment as soon as possible for a visit in 1 week.   Contact information:   [address] [place] [id number] [phone]      Signed: Lesly DukesLEGGETT,Karoline Fleer H. 01/23/2013, 7:13 AM

## 2013-01-23 NOTE — Progress Notes (Signed)
Pt is discharged in the care of friend. Downstairs with [employer]. Denies  Pain or discomfort. Only scanty amt of vaginal discharge (brownish in color) noted on V-pad. Discharged instructions with Rx were given to pt. Questions were answered.

## 2013-01-24 ENCOUNTER — Inpatient Hospital Stay (HOSPITAL_COMMUNITY): Payer: 59 | Admitting: Anesthesiology

## 2013-01-24 ENCOUNTER — Encounter (HOSPITAL_COMMUNITY): Payer: 59 | Admitting: Anesthesiology

## 2013-01-24 ENCOUNTER — Encounter (HOSPITAL_COMMUNITY)
Admission: AD | Disposition: A | Discharge status: Home / Self Care | Payer: Self-pay | Source: Ambulatory Visit | Attending: Obstetrics and Gynecology

## 2013-01-24 ENCOUNTER — Observation Stay (HOSPITAL_COMMUNITY)
Admission: AD | Admit: 2013-01-24 | Discharge: 2013-01-25 | Disposition: A | Discharge status: Home / Self Care | Payer: 59 | Source: Ambulatory Visit | Attending: Obstetrics and Gynecology | Admitting: Obstetrics and Gynecology

## 2013-01-24 ENCOUNTER — Encounter (HOSPITAL_COMMUNITY): Payer: Self-pay | Admitting: *Deleted

## 2013-01-24 ENCOUNTER — Inpatient Hospital Stay (HOSPITAL_COMMUNITY): Payer: 59

## 2013-01-24 DIAGNOSIS — O4290 Premature rupture of membranes, unspecified as to length of time between rupture and onset of labor, unspecified weeks of gestation: Secondary | ICD-10-CM | POA: Insufficient documentation

## 2013-01-24 DIAGNOSIS — O034 Incomplete spontaneous abortion without complication: Principal | ICD-10-CM | POA: Insufficient documentation

## 2013-01-24 DIAGNOSIS — O3430 Maternal care for cervical incompetence, unspecified trimester: Secondary | ICD-10-CM | POA: Insufficient documentation

## 2013-01-24 DIAGNOSIS — O3432 Maternal care for cervical incompetence, second trimester: Secondary | ICD-10-CM

## 2013-01-24 HISTORY — PX: DILATION AND EVACUATION: SHX1459

## 2013-01-24 LAB — CBC
HEMATOCRIT: 40 % (ref 36.0–46.0)
Hemoglobin: 14.2 g/dL (ref 12.0–15.0)
MCH: 30.2 pg (ref 26.0–34.0)
MCHC: 35.5 g/dL (ref 30.0–36.0)
MCV: 85.1 fL (ref 78.0–100.0)
Platelets: 280 10*3/uL (ref 150–400)
RBC: 4.7 MIL/uL (ref 3.87–5.11)
RDW: 12.5 % (ref 11.5–15.5)
WBC: 17.8 10*3/uL — ABNORMAL HIGH (ref 4.0–10.5)

## 2013-01-24 LAB — RPR: RPR: NONREACTIVE

## 2013-01-24 LAB — TYPE AND SCREEN
ABO/RH(D): O POS
Antibody Screen: NEGATIVE

## 2013-01-24 SURGERY — DILATION AND EVACUATION, UTERUS
Anesthesia: Spinal | Site: Vagina

## 2013-01-24 MED ORDER — FENTANYL CITRATE 0.05 MG/ML IJ SOLN
INTRAMUSCULAR | Status: DC | PRN
  Administered 2013-01-24 (×2): 50 ug via INTRAVENOUS

## 2013-01-24 MED ORDER — MIDAZOLAM HCL 2 MG/2ML IJ SOLN
INTRAMUSCULAR | Status: AC
  Filled 2013-01-24: qty 2

## 2013-01-24 MED ORDER — CHLOROPROCAINE HCL 1 % IJ SOLN
INTRAMUSCULAR | Status: AC
  Filled 2013-01-24: qty 30

## 2013-01-24 MED ORDER — METHYLERGONOVINE MALEATE 0.2 MG PO TABS
ORAL_TABLET | ORAL | Status: AC
  Filled 2013-01-24: qty 1

## 2013-01-24 MED ORDER — FENTANYL CITRATE 0.05 MG/ML IJ SOLN
25.0000 ug | INTRAMUSCULAR | Status: DC | PRN
  Administered 2013-01-24: 50 ug via INTRAVENOUS

## 2013-01-24 MED ORDER — MISOPROSTOL 200 MCG PO TABS
200.0000 ug | ORAL_TABLET | Freq: Once | ORAL | Status: AC
  Administered 2013-01-24: 200 ug via ORAL
  Filled 2013-01-24: qty 1

## 2013-01-24 MED ORDER — DOXYCYCLINE HYCLATE 100 MG IV SOLR
200.0000 mg | Freq: Once | INTRAVENOUS | Status: AC
  Administered 2013-01-24: 200 mg via INTRAVENOUS
  Filled 2013-01-24: qty 200

## 2013-01-24 MED ORDER — IBUPROFEN 600 MG PO TABS: 600.0000 mg | ORAL_TABLET | Freq: Four times a day (QID) | ORAL | Status: DC | PRN

## 2013-01-24 MED ORDER — ONDANSETRON HCL 4 MG/2ML IJ SOLN
INTRAMUSCULAR | Status: AC
  Filled 2013-01-24: qty 2

## 2013-01-24 MED ORDER — ONDANSETRON HCL 4 MG/2ML IJ SOLN
INTRAMUSCULAR | Status: DC | PRN
  Administered 2013-01-24: 4 mg via INTRAVENOUS

## 2013-01-24 MED ORDER — OXYTOCIN 40 UNITS IN LACTATED RINGERS INFUSION - SIMPLE MED
62.5000 mL/h | INTRAVENOUS | Status: DC
  Administered 2013-01-24: 100 mL/h via INTRAVENOUS
  Filled 2013-01-24 (×2): qty 1000

## 2013-01-24 MED ORDER — MISOPROSTOL 25 MCG QUARTER TABLET
100.0000 ug | ORAL_TABLET | Freq: Once | ORAL | Status: AC
  Administered 2013-01-24: 100 ug via ORAL
  Filled 2013-01-24: qty 1

## 2013-01-24 MED ORDER — TRAMADOL HCL 50 MG PO TABS
50.0000 mg | ORAL_TABLET | Freq: Four times a day (QID) | ORAL | Status: DC | PRN
  Administered 2013-01-24 – 2013-01-25 (×2): 50 mg via ORAL
  Filled 2013-01-24 (×2): qty 1

## 2013-01-24 MED ORDER — LIDOCAINE HCL (PF) 1 % IJ SOLN: 30.0000 mL | INTRAMUSCULAR | Status: DC | PRN

## 2013-01-24 MED ORDER — IBUPROFEN 600 MG PO TABS
600.0000 mg | ORAL_TABLET | Freq: Four times a day (QID) | ORAL | Status: DC | PRN
  Administered 2013-01-25 (×2): 600 mg via ORAL
  Filled 2013-01-24 (×2): qty 1

## 2013-01-24 MED ORDER — OXYTOCIN BOLUS FROM INFUSION: 500.0000 mL | INTRAVENOUS | Status: DC

## 2013-01-24 MED ORDER — LACTATED RINGERS IV SOLN
INTRAVENOUS | Status: DC
  Administered 2013-01-24 (×2): via INTRAVENOUS

## 2013-01-24 MED ORDER — FENTANYL CITRATE 0.05 MG/ML IJ SOLN
INTRAMUSCULAR | Status: AC
  Filled 2013-01-24: qty 2

## 2013-01-24 MED ORDER — CHLOROPROCAINE HCL 1 % IJ SOLN
INTRAMUSCULAR | Status: DC | PRN
  Administered 2013-01-24: 10 mL

## 2013-01-24 MED ORDER — METHYLERGONOVINE MALEATE 0.2 MG PO TABS
0.2000 mg | ORAL_TABLET | Freq: Four times a day (QID) | ORAL | Status: DC
  Administered 2013-01-24 – 2013-01-25 (×3): 0.2 mg via ORAL
  Filled 2013-01-24 (×2): qty 1

## 2013-01-24 MED ORDER — CITRIC ACID-SODIUM CITRATE 334-500 MG/5ML PO SOLN
30.0000 mL | ORAL | Status: DC | PRN
  Administered 2013-01-24: 30 mL via ORAL
  Filled 2013-01-24: qty 15

## 2013-01-24 MED ORDER — BUTORPHANOL TARTRATE 1 MG/ML IJ SOLN
2.0000 mg | Freq: Once | INTRAMUSCULAR | Status: AC
  Administered 2013-01-24: 2 mg via INTRAVENOUS
  Filled 2013-01-24: qty 2

## 2013-01-24 MED ORDER — LACTATED RINGERS IV SOLN: 500.0000 mL | INTRAVENOUS | Status: DC | PRN

## 2013-01-24 MED ORDER — ONDANSETRON HCL 4 MG/2ML IJ SOLN: 4.0000 mg | Freq: Four times a day (QID) | INTRAMUSCULAR | Status: DC | PRN

## 2013-01-24 MED ORDER — MIDAZOLAM HCL 5 MG/5ML IJ SOLN
INTRAMUSCULAR | Status: DC | PRN
  Administered 2013-01-24 (×3): 1 mg via INTRAVENOUS

## 2013-01-24 SURGICAL SUPPLY — 23 items
ADAPTER VACURETTE TBG SET 14 (CANNULA) ×3 IMPLANT
CATH FOLEY 2WAY SLVR  5CC 16FR (CATHETERS) ×2
CATH FOLEY 2WAY SLVR 5CC 16FR (CATHETERS) ×1 IMPLANT
CATH ROBINSON RED A/P 16FR (CATHETERS) IMPLANT
DECANTER SPIKE VIAL GLASS SM (MISCELLANEOUS) ×3 IMPLANT
GLOVE BIOGEL PI IND STRL 6.5 (GLOVE) ×1 IMPLANT
GLOVE BIOGEL PI INDICATOR 6.5 (GLOVE) ×2
GLOVE SURG SS PI 6.0 STRL IVOR (GLOVE) ×3 IMPLANT
GOWN STRL REIN XL XLG (GOWN DISPOSABLE) ×6 IMPLANT
KIT BERKELEY 1ST TRIMESTER 3/8 (MISCELLANEOUS) ×3 IMPLANT
NEEDLE SPNL 22GX3.5 QUINCKE BK (NEEDLE) ×3 IMPLANT
NS IRRIG 1000ML POUR BTL (IV SOLUTION) ×3 IMPLANT
PACK VAGINAL MINOR WOMEN LF (CUSTOM PROCEDURE TRAY) ×3 IMPLANT
PAD OB MATERNITY 4.3X12.25 (PERSONAL CARE ITEMS) ×3 IMPLANT
PAD PREP 24X48 CUFFED NSTRL (MISCELLANEOUS) ×3 IMPLANT
SET BERKELEY SUCTION TUBING (SUCTIONS) ×3 IMPLANT
SYR CONTROL 10ML LL (SYRINGE) ×3 IMPLANT
TOWEL OR 17X24 6PK STRL BLUE (TOWEL DISPOSABLE) ×6 IMPLANT
VACURETTE 10 RIGID CVD (CANNULA) IMPLANT
VACURETTE 12 RIGID CVD (CANNULA) ×3 IMPLANT
VACURETTE 7MM CVD STRL WRAP (CANNULA) IMPLANT
VACURETTE 8 RIGID CVD (CANNULA) IMPLANT
VACURETTE 9 RIGID CVD (CANNULA) IMPLANT

## 2013-01-24 NOTE — Progress Notes (Signed)
01/24/13 1500  Clinical Encounter Type  Visited With Patient and family together (primarily with patient alone, then joined by FOB Kendrick)  Visit Type Psychological support;Spiritual support (loss:  delivery at 17 weeks)  Referral From Nurse Beverley Fiedler(Liz Cone, RN)  Consult/Referral To Nurse;Social work (Per RN, she will f/u with SW for counseling referrals)  Spiritual Encounters  Spiritual Needs Emotional;Grief support (reviewed Comfort Packet)  Stress Factors  Patient Stress Factors Loss;Loss of control (pt reports that she copes with alcohol)  Family Stress Factors Loss;Loss of control   Made meaningful, constructive bereavement visit with Grant (and, later, Kendrick).  She noted that "it hasn't really hit me yet," so we talked at length about grieving processes, and how numbness, surrealness, and disbelief can be common at the beginning.  She spoke frankly and appears to have self-awareness about coping tools (privacy/solitude, making poetry and art, alcohol) and expressed some concern about her own alcohol use (per pt, she has not used any since learning of pregnancy).  She is open to counseling referrals, which RN will seek from SW. Also provided pastoral presence, reflective listening, grief education, review of Comfort Packet. Please page if further support needed:  [phone].  Thank you!  [address]Chaplain Fredrico Beedle [place], [place] [phone]

## 2013-01-24 NOTE — Anesthesia Preprocedure Evaluation (Signed)

## 2013-01-24 NOTE — Anesthesia Postprocedure Evaluation (Signed)
  Anesthesia Post-op Note  Patient: Taylor FarrierRaquel Flowers  Procedure(s) Performed: Procedure(s): DILATATION AND EVACUATION (N/A)  Patient is awake, responsive, moving her legs, and has signs of resolution of her numbness. Pain and nausea are reasonably well controlled. Vital signs are stable and clinically acceptable. Oxygen saturation is clinically acceptable. There are no apparent anesthetic complications at this time. Patient is ready for discharge.

## 2013-01-24 NOTE — Progress Notes (Addendum)
Patient ID: Taylor FarrierRaquel Flowers, female   DOB: 10/11/1990, 23 y.o.   MRN: [id number] Patient now 4 hours s/p delivery of infant, placenta remains in situ. Attempted to tease out placenta with ring forceps without success. Will hold pitocin infusion and administer cytotec po. Patient to receive stadol for pain control. Patient with minimal vaginal bleeding

## 2013-01-24 NOTE — H&P (Signed)
   History     CSN: [id number]  Arrival date and time: 01/24/13 1038   First Provider Initiated Contact with Patient 01/24/13 1115      Chief Complaint  Patient presents with  . Abdominal Pain   HPI  Pt is a 23 yo G2P0010 female at [redacted]w[redacted]d with Previable PPROM (diagnosed at 17 weeks, discharged yesterday) presenting to MAU via EMS with continued leakage and reporting she heard a pop this morning.  Sensation of pelvic pressure ever since.  She placed her fingers in her vagina and no longer feels a bag of fluid, but a harder presence.  Feels like she needs to urinate currently.  Denies vaginal blood, contractions, or pain.  She is accompanied by her boyfriend today.  Patient has been counseled extensively during her previous hospital stay regarding PPROM and is aware of the prognosis of the fetus.    Past Medical History  Diagnosis Date  . Medical history non-contributory     Past Surgical History  Procedure Laterality Date  . Dilation and curretage      Family History  Problem Relation Age of Onset  . Hypertension Mother   . Hypertension Father   . Diabetes Father   . Vision loss Father     History  Substance Use Topics  . Smoking status: Never Smoker   . Smokeless tobacco: Not on file  . Alcohol Use: No    Allergies:  Allergies  Allergen Reactions  . Darvocet [Propoxyphene N-Acetaminophen] Other (See Comments)    Pt states she starts shaking"real bad"  . Flagyl [Metronidazole] Hives  . Latex Rash    Prescriptions prior to admission  Medication Sig Dispense Refill  . acetaminophen (TYLENOL) 325 MG tablet Take 2 tablets (650 mg total) by mouth daily as needed for moderate pain or headache.  30 tablet  0  . Prenatal Vit-Fe Fumarate-FA (PRENATAL MULTIVITAMIN) TABS tablet Take 1 tablet by mouth daily at 12 noon.      . docusate sodium 100 MG CAPS Take 100 mg by mouth daily.  10 capsule  0    ROS Physical Exam   Blood pressure 130/85, pulse 105, temperature 98.5 F  (36.9 C), temperature source Oral, resp. rate 18, last menstrual period 09/24/2012.  Physical Exam  Constitutional: She is oriented to person, place, and time. She appears well-developed and well-nourished. No distress.  HENT:  Head: Normocephalic.  Neck: Normal range of motion. Neck supple.  Cardiovascular: Normal rate, regular rhythm and normal heart sounds.   Respiratory: Effort normal and breath sounds normal.  GI: Soft. There is no tenderness.  Genitourinary: No bleeding around the vagina.  Fetal foot seen at introitus; palpated majority of fetus in vaginal canal, unable to feel fetal head  Neurological: She is alert and oriented to person, place, and time.  Skin: Skin is warm and dry.   1145 Dr. Constant called > reviewed HPI/exam/OB history > will see patient, admit for cytotec and delivery.  MAU Course  Procedures  Assessment and Plan  22 yo G2P0010 at [redacted]w[redacted]d wks IUP  PPROM - Inevitable Delivery  Plan: Admit to AICU (unable) > Birthing Suites Begin Cytotec augmentation Anticipate vaginal delivery  MUHAMMAD,Herminia Warren 01/24/2013, 11:48 AM  

## 2013-01-24 NOTE — MAU Note (Addendum)
Arrived via EMS. States she vomited this morning.States she felt a "pop," some more fluid came out and she has felt pressure every since.

## 2013-01-24 NOTE — Progress Notes (Signed)
Patient ID: Taylor FarrierRaquel Flowers, female   DOB: 02/23/1990, 23 y.o.   MRN: [id number] 23 yo s/p delivery of non viable female infant at 1 pm. Patient still has placenta in situ with minimal bleeding and cramping pain.  SVE: membranes in vaginal vault, no palpable placenta tissue at os as during previous exam  A/P 23 yo with retained placenta s/p delivery of 17 week fetus - Discussed proceeding with dilatation and evacuation. Risks, benefits and alternatives were explained including but not limited to risks of bleeding, infection, uterine perforation and damage to adjacent organs. Patient verbalized understanding and all questions were answered. OR staff notified

## 2013-01-24 NOTE — Progress Notes (Signed)
Called by Nurse who informed me of spontaneous delivery of non-viable female infant. Cord clamped.  Will start pitocin infusion and await for delivery of placenta.  Patient seems in good emotional state with partner at her side

## 2013-01-24 NOTE — Anesthesia Procedure Notes (Signed)
Spinal  Patient location during procedure: OR Preanesthetic Checklist Completed: patient identified, site marked, surgical consent, pre-op evaluation, timeout performed, IV checked, risks and benefits discussed and monitors and equipment checked Spinal Block Patient position: sitting Prep: DuraPrep Patient monitoring: heart rate, cardiac monitor, continuous pulse ox and blood pressure Approach: midline Location: L3-4 Injection technique: single-shot Needle Needle type: Sprotte  Needle gauge: 24 G Needle length: 9 cm Assessment Sensory level: T8 Additional Notes Spinal Dosage in OR  5% Xylocaine ml       1.6

## 2013-01-24 NOTE — Progress Notes (Addendum)
Patient ID: Taylor FarrierRaquel Coviello, female   DOB: 04/23/1990, 23 y.o.   MRN: [id number] Patient is doing well and denies any type of pain at this time.   SVE: One foot visualized at external os. Patient was able to deliver most of the body through valsalva but cervix dilated to 3 cm with head entrapment. No vaginal bleeding  A/P 23 yo G2P0010 at 17 weeks with cervical incompetentce and PPROM  - Buccal cytotec for now - Will re-evaluate in a few hours or when patient starts feeling pressure - Patient is in good spirit at this time with partner at her bedside

## 2013-01-24 NOTE — MAU Provider Note (Signed)
  History     CSN: [id number]  Arrival date and time: 01/24/13 1038   First Provider Initiated Contact with Patient 01/24/13 1115      Chief Complaint  Patient presents with  . Abdominal Pain   HPI  Pt is a 23 yo G2P0010 female at 6662w3d with Previable PPROM (diagnosed at 17 weeks, discharged yesterday) presenting to MAU via EMS with continued leakage and reporting she heard a pop this morning.  Sensation of pelvic pressure ever since.  She placed her fingers in her vagina and no longer feels a bag of fluid, but a harder presence.  Feels like she needs to urinate currently.  Denies vaginal blood, contractions, or pain.  She is accompanied by her boyfriend today.  Patient has been counseled extensively during her previous hospital stay regarding PPROM and is aware of the prognosis of the fetus.    Past Medical History  Diagnosis Date  . Medical history non-contributory     Past Surgical History  Procedure Laterality Date  . Dilation and curretage      Family History  Problem Relation Age of Onset  . Hypertension Mother   . Hypertension Father   . Diabetes Father   . Vision loss Father     History  Substance Use Topics  . Smoking status: Never Smoker   . Smokeless tobacco: Not on file  . Alcohol Use: No    Allergies:  Allergies  Allergen Reactions  . Darvocet [Propoxyphene N-Acetaminophen] Other (See Comments)    Pt states she starts shaking"real bad"  . Flagyl [Metronidazole] Hives  . Latex Rash    Prescriptions prior to admission  Medication Sig Dispense Refill  . acetaminophen (TYLENOL) 325 MG tablet Take 2 tablets (650 mg total) by mouth daily as needed for moderate pain or headache.  30 tablet  0  . Prenatal Vit-Fe Fumarate-FA (PRENATAL MULTIVITAMIN) TABS tablet Take 1 tablet by mouth daily at 12 noon.      . docusate sodium 100 MG CAPS Take 100 mg by mouth daily.  10 capsule  0    ROS Physical Exam   Blood pressure 130/85, pulse 105, temperature 98.5 F  (36.9 C), temperature source Oral, resp. rate 18, last menstrual period 09/24/2012.  Physical Exam  Constitutional: She is oriented to person, place, and time. She appears well-developed and well-nourished. No distress.  HENT:  Head: Normocephalic.  Neck: Normal range of motion. Neck supple.  Cardiovascular: Normal rate, regular rhythm and normal heart sounds.   Respiratory: Effort normal and breath sounds normal.  GI: Soft. There is no tenderness.  Genitourinary: No bleeding around the vagina.  Fetal foot seen at introitus; palpated majority of fetus in vaginal canal, unable to feel fetal head  Neurological: She is alert and oriented to person, place, and time.  Skin: Skin is warm and dry.   1145 Dr. Jolayne Pantheronstant called > reviewed HPI/exam/OB history > will see patient, admit for cytotec and delivery.  MAU Course  Procedures  Assessment and Plan  23 yo G2P0010 at 2962w3d wks IUP  PPROM - Inevitable Delivery  Plan: Admit to AICU (unable) > Birthing Suites Begin Cytotec augmentation Anticipate vaginal delivery  [facility],Shantae Vantol 01/24/2013, 11:48 AM

## 2013-01-24 NOTE — Op Note (Addendum)
Taylor Flowers PROCEDURE DATE: 01/24/2013  PREOPERATIVE DIAGNOSIS:retained placenta. POSTOPERATIVE DIAGNOSIS: The same. PROCEDURE:     Dilation and Evacuation. SURGEON:  Dr. Catalina AntiguaPeggy Tramane Gorum  INDICATIONS: 23 y.o. G2P0010 s/p delivery of 17 week fetus secondary to incompetent cervix and PPROM with retained placenta, needing surgical completion.  Risks of surgery were discussed with the patient including but not limited to: bleeding which may require transfusion; infection which may require antibiotics; injury to uterus or surrounding organs;need for additional procedures including laparotomy or laparoscopy; possibility of intrauterine scarring which may impair future fertility; and other postoperative/anesthesia complications. Written informed consent was obtained.    FINDINGS:  A 14-week size anteverted uterus, moderate amounts of products of conception, specimen sent to pathology.  ANESTHESIA:    Monitored intravenous sedation, paracervical block. INTRAVENOUS FLUIDS:  1100 ml of LR ESTIMATED BLOOD LOSS:  Less than 20 ml. SPECIMENS:  Products of conception sent to pathology COMPLICATIONS:  None immediate.  PROCEDURE DETAILS:  The patient was taken to the operating room where general anesthesia was administered and was found to be adequate.  After an adequate timeout was performed, she was placed in the dorsal lithotomy position and examined; then prepped and draped in the sterile manner.   Her bladder was catheterized for an unmeasured amount of clear, yellow urine. A vaginal speculum was then placed in the patient's vagina and a single tooth tenaculum was applied to the anterior lip of the cervix.  A paracervical block using 0.5% Marcaine was administered. The cervix was gently dilated to accommodate a 14 mm suction curette that was gently advanced to the uterine fundus.  The suction device was then activated and curette slowly rotated to clear the uterus of products of conception.  A sharp curettage  was then performed to confirm complete emptying of the uterus. There was minimal bleeding noted and the tenaculum removed with good hemostasis noted.   All instruments were removed from the patient's vagina. The patient tolerated the procedure well and was taken to the recovery area awake, and in stable condition.

## 2013-01-24 NOTE — Transfer of Care (Signed)
Immediate Anesthesia Transfer of Care Note  Patient: Taylor FarrierRaquel Antunes  Procedure(s) Performed: Procedure(s): DILATATION AND EVACUATION (N/A)  Patient Location: PACU  Anesthesia Type:Spinal  Level of Consciousness: awake, alert , oriented and patient cooperative  Airway & Oxygen Therapy: Patient Spontanous Breathing  Post-op Assessment: Report given to PACU RN and Post -op Vital signs reviewed and stable  Post vital signs: Reviewed and stable  Complications: No apparent anesthesia complications

## 2013-01-25 ENCOUNTER — Encounter (HOSPITAL_COMMUNITY): Payer: Self-pay | Admitting: Obstetrics and Gynecology

## 2013-01-25 LAB — CBC
HEMATOCRIT: 32.3 % — AB (ref 36.0–46.0)
HEMOGLOBIN: 11.3 g/dL — AB (ref 12.0–15.0)
MCH: 29.7 pg (ref 26.0–34.0)
MCHC: 35 g/dL (ref 30.0–36.0)
MCV: 85 fL (ref 78.0–100.0)
Platelets: 247 10*3/uL (ref 150–400)
RBC: 3.8 MIL/uL — ABNORMAL LOW (ref 3.87–5.11)
RDW: 12.4 % (ref 11.5–15.5)
WBC: 19.2 10*3/uL — ABNORMAL HIGH (ref 4.0–10.5)

## 2013-01-25 MED ORDER — METHYLERGONOVINE MALEATE 0.2 MG PO TABS: 0.2000 mg | ORAL_TABLET | Freq: Four times a day (QID) | ORAL | Status: DC

## 2013-01-25 MED ORDER — TRAMADOL HCL 50 MG PO TABS: 50.0000 mg | ORAL_TABLET | Freq: Four times a day (QID) | ORAL | Status: DC | PRN

## 2013-01-25 MED ORDER — IBUPROFEN 600 MG PO TABS: 600.0000 mg | ORAL_TABLET | Freq: Four times a day (QID) | ORAL | Status: DC | PRN

## 2013-01-25 NOTE — Progress Notes (Signed)
01/25/13 0900  Clinical Encounter Type  Visited With Patient and family together (mom and brother)  Visit Type Follow-up;Spiritual support;Social support  Referral From Nurse  Spiritual Encounters  Spiritual Needs Grief support;Emotional  Stress Factors  Patient Stress Factors (moving further into grief/sorrow/processing)  Family Stress Factors (pt's mom supportive, aware of her own grief)   Powerful follow-up visit with Taylor Flowers.  Mom and brother also present.  Taylor Flowers was proud of herself for moving into the harder emotions of grief and beginning to cope at a new level with her sorrow and her remembering/honoring/celebrating her baby.  Provided affirmation, pastoral reflection, grief education (especially about communication and incongruent grief), encouragement.  Discussed possibility of memorial service, something that I could help plan and officiate if desired; Taylor Flowers felt interest in this healing possibility and may contact me for further assistance as she moves deeper into her coping and grieving process.  Per pt, her aunt [place] works for a funeral home and has contacted [employer] as a Multimedia programmerlocal service provider.  Per pt, she chooses cremation because she wants to honor her baby and may choose to put some of his remains in a locket to keep close to her heart.  Affirmed the healing work she is doing in telling his story and remembering all the sacred details (like kicking in the womb and very gentle delivery) of their time together.  Pt has my card for follow-up support, if desired.  [address]Chaplain Danyetta Gillham [place], [place] [phone]

## 2013-01-25 NOTE — Discharge Summary (Signed)
Physician Discharge Summary  Patient ID: Taylor FarrierRaquel Flowers MRN: [id number] DOB/AGE: 24/07/1990 22 y.o.  Admit date: 01/24/2013 Discharge date: 01/25/2013  Admission Diagnoses: Incompetent cervix with PPROM  Discharge Diagnoses: same s/p delivery of fetus and products of conceptions Active Problems:   PROM (premature rupture of membranes)   Discharged Condition: good  Hospital Course: Patient diagnosed with incompetent cervix on 01/19/2013, PPROM on 01/22/2013 and chose expectant management. Patient returned to MAU on 1/6 with fetal parts visible at introitus. Patient subsequently delivered a non viable female fetus at 1 pm. Placenta remained in situ for 7 hours despite pitocin and cytotec administration. Patient was taken to operating room for D&E where retained products were evacuated from uterus. Patient remained stable overnight with minimal bleeding. Discharge instructions were provided.   Consults: None  Significant Diagnostic Studies: labs: admission Hg 14; POD#1 Hg 11  Treatments: SVD with D&E  Discharge Exam: Blood pressure 103/64, pulse 85, temperature 97.8 F (36.6 C), temperature source Oral, resp. rate 16, height 5' 10.5" (1.791 m), weight 292 lb (132.45 kg), last menstrual period 09/24/2012, SpO2 97.00%. General appearance: alert, cooperative and no distress Resp: clear to auscultation bilaterally Cardio: regular rate and rhythm GI: soft, non-tender; bowel sounds normal; no masses,  no organomegaly Extremities: Homans sign is negative, no sign of DVT and no edema, redness or tenderness in the calves or thighs  Disposition: 01-Home or Self Care   Future Appointments Provider Department Dept Phone   01/30/2013 8:00 AM Adam PhenixJames G Arnold, MD [facility] [phone]       Medication List    STOP taking these medications       acetaminophen 325 MG tablet  Commonly known as:  TYLENOL      TAKE these medications       DSS 100 MG Caps  Take 100 mg by mouth daily.     ibuprofen 600 MG tablet  Commonly known as:  ADVIL,MOTRIN  Take 1 tablet (600 mg total) by mouth every 6 (six) hours as needed (mild pain).     methylergonovine 0.2 MG tablet  Commonly known as:  METHERGINE  Take 1 tablet (0.2 mg total) by mouth 4 (four) times daily.     prenatal multivitamin Tabs tablet  Take 1 tablet by mouth daily at 12 noon.     traMADol 50 MG tablet  Commonly known as:  ULTRAM  Take 1 tablet (50 mg total) by mouth every 6 (six) hours as needed for moderate pain.           Follow-up Information   [facility] (An appointment will be made for you to follow up in 4-6 weeks)    Specialty:  Obstetrics and Gynecology   Contact information:   [address] [place] [address] [phone]      Signed: Lysander Calixte 01/25/2013, 6:36 AM

## 2013-01-25 NOTE — Anesthesia Postprocedure Evaluation (Signed)
  Anesthesia Post-op Note  Patient: Taylor Flowers  Procedure(s) Performed: Procedure(s): DILATATION AND EVACUATION (N/A)  Patient Location: PACU and Women's Unit  Anesthesia Type:Spinal  Level of Consciousness: awake, alert  and oriented  Airway and Oxygen Therapy: Patient Spontanous Breathing  Post-op Pain: none  Post-op Assessment: Post-op Vital signs reviewed, Patient's Cardiovascular Status Stable, No headache, No backache, No residual numbness and No residual motor weakness  Post-op Vital Signs: Reviewed and stable  Complications: No apparent anesthesia complications

## 2013-01-25 NOTE — H&P (Signed)
Attestation of Attending Supervision of Advanced Practitioner (CNM/NP): Evaluation and management procedures were performed by the Advanced Practitioner under my supervision and collaboration.  I have reviewed the Advanced Practitioner's note and chart, and I agree with the management and plan.  Prentiss Hammett 01/25/2013 7:39 AM   

## 2013-01-25 NOTE — Progress Notes (Signed)
Post discharge review completed. 

## 2013-01-25 NOTE — MAU Provider Note (Signed)
Attestation of Attending Supervision of Advanced Practitioner (CNM/NP): Evaluation and management procedures were performed by the Advanced Practitioner under my supervision and collaboration.  I have reviewed the Advanced Practitioner's note and chart, and I agree with the management and plan.  Zonie Crutcher 01/25/2013 7:40 AM   

## 2013-01-25 NOTE — Progress Notes (Signed)
CSW received consult due to 17 week delivery.  Patient being followed by [facility].  [facility] will contact CSW if further needs arise.  CSW screening out referral at this time.

## 2013-01-25 NOTE — Discharge Instructions (Signed)
Vaginal Delivery °Care After °Refer to this sheet in the next few weeks. These discharge instructions provide you with information on caring for yourself after delivery. Your caregiver may also give you specific instructions. Your treatment has been planned according to the most current medical practices available, but problems sometimes occur. Call your caregiver if you have any problems or questions after you go home. °HOME CARE INSTRUCTIONS °· Take over-the-counter or prescription medicines only as directed by your caregiver or pharmacist. °· Do not drink alcohol, especially if you are breastfeeding or taking medicine to relieve pain. °· Do not chew or smoke tobacco. °· Do not use illegal drugs. °· Continue to use good perineal care. Good perineal care includes: °· Wiping your perineum from front to back. °· Keeping your perineum clean. °· Do not use tampons or douche until your caregiver says it is okay. °· Shower, wash your hair, and take tub baths as directed by your caregiver. °· Wear a well-fitting bra that provides breast support. °· Eat healthy foods. °· Drink enough fluids to keep your urine clear or pale yellow. °· Eat high-fiber foods such as whole grain cereals and breads, brown rice, beans, and fresh fruits and vegetables every day. These foods may help prevent or relieve constipation. °· Follow your cargiver's recommendations regarding resumption of activities such as climbing stairs, driving, lifting, exercising, or traveling. °· Talk to your caregiver about resuming sexual activities. Resumption of sexual activities is dependent upon your risk of infection, your rate of healing, and your comfort and desire to resume sexual activity. °· Try to have someone help you with your household activities and your newborn for at least a few days after you leave the hospital. °· Rest as much as possible. Try to rest or take a nap when your newborn is sleeping. °· Increase your activities gradually. °· Keep all  of your scheduled postpartum appointments. It is very important to keep your scheduled follow-up appointments. At these appointments, your caregiver will be checking to make sure that you are healing physically and emotionally. °SEEK MEDICAL CARE IF:  °· You are passing large clots from your vagina. Save any clots to show your caregiver. °· You have a foul smelling discharge from your vagina. °· You have trouble urinating. °· You are urinating frequently. °· You have pain when you urinate. °· You have a change in your bowel movements. °· You have increasing redness, pain, or swelling near your vaginal incision (episiotomy) or vaginal tear. °· You have pus draining from your episiotomy or vaginal tear. °· Your episiotomy or vaginal tear is separating. °· You have painful, hard, or reddened breasts. °· You have a severe headache. °· You have blurred vision or see spots. °· You feel sad or depressed. °· You have thoughts of hurting yourself or your newborn. °· You have questions about your care, the care of your newborn, or medicines. °· You are dizzy or lightheaded. °· You have a rash. °· You have nausea or vomiting. °· You were breastfeeding and have not had a menstrual period within 12 weeks after you stopped breastfeeding. °· You are not breastfeeding and have not had a menstrual period by the 12th week after delivery. °· You have a fever. °SEEK IMMEDIATE MEDICAL CARE IF:  °· You have persistent pain. °· You have chest pain. °· You have shortness of breath. °· You faint. °· You have leg pain. °· You have stomach pain. °· Your vaginal bleeding saturates two or more sanitary pads   in 1 hour. MAKE SURE YOU:   Understand these instructions.  Will watch your condition.  Will get help right away if you are not doing well or get worse. Document Released: 01/03/2000 Document Revised: 09/30/2011 Document Reviewed: 09/02/2011 [facility] Patient Information 2014 [place], [place]. Dilation and Curettage or Vacuum  Curettage Care After Refer to this sheet in the next few weeks. These instructions provide you with general information on caring for yourself after your procedure. Your caregiver may also give you more specific instructions. Your treatment has been planned according to current medical practices, but problems sometimes occur. Call your caregiver if you have any problems or questions after your procedure. HOME CARE INSTRUCTIONS   Do not drive for 24 hours.  Wait 1 week before returning to strenuous activities.  Take your temperature 2 times a day for 4 days and write it down. Provide these temperatures to your caregiver if you develop a fever.  Avoid long periods of standing, and do no heavy lifting (more than 10 pounds or 4.5 kg), pushing, or pulling.  Limit stair climbing to once or twice a day.  Take rest periods often.  You may resume your usual diet.  Drink enough fluids to keep your urine clear or pale yellow.  You should return to your usual bowel function. If constipation should occur, you may:  Take a mild laxative with permission from your caregiver.  Add fruit and bran to your diet.  Drink more fluids.  Take showers instead of baths until your caregiver gives you permission to take baths.  Do not go swimming or use a hot tub until your caregiver gives you permission.  Try to have someone with you or available to you the first 24 to 48 hours, especially if you had a general anesthetic.  Do not douche, use tampons, or have intercourse until after your follow-up appointment, or when your caregiver approves.  Only take over-the-counter or prescription medicines for pain, discomfort, or fever as directed by your caregiver. Do not take aspirin. It can cause bleeding.  If a prescription was given, follow your caregiver's directions.  Keep all your follow-up appointments recommended by your caregiver. SEEK MEDICAL CARE IF:   You have increasing cramps or pain not  relieved with medicine.  You have abdominal pain which does not seem to be related to the same area of earlier cramping and pain.  You have bad smelling vaginal discharge.  You have a rash.  You have problems with any medicine. SEEK IMMEDIATE MEDICAL CARE IF:   You have bleeding that is heavier than a normal menstrual period.  You have a fever.  You have chest pain.  You have shortness of breath.  You feel dizzy or feel like fainting.  You pass out.  You have pain in your shoulder strap area.  You have heavy vaginal bleeding with or without blood clots. MAKE SURE YOU:   Understand these instructions.  Will watch your condition.  Will get help right away if you are not doing well or get worse. Document Released: 01/03/2000 Document Revised: 03/30/2011 Document Reviewed: 08/04/2012 [facility] Patient Information 2014 [place]ExitCare, [place].

## 2013-01-30 ENCOUNTER — Encounter: Payer: PRIVATE HEALTH INSURANCE | Admitting: Obstetrics & Gynecology

## 2013-01-31 ENCOUNTER — Inpatient Hospital Stay (HOSPITAL_COMMUNITY): Payer: No Typology Code available for payment source

## 2013-01-31 ENCOUNTER — Inpatient Hospital Stay (HOSPITAL_COMMUNITY)
Admission: AD | Admit: 2013-01-31 | Discharge: 2013-01-31 | Disposition: A | Discharge status: Home / Self Care | Payer: No Typology Code available for payment source | Source: Ambulatory Visit | Attending: Family Medicine | Admitting: Family Medicine

## 2013-01-31 DIAGNOSIS — O034 Incomplete spontaneous abortion without complication: Secondary | ICD-10-CM

## 2013-01-31 DIAGNOSIS — O039 Complete or unspecified spontaneous abortion without complication: Secondary | ICD-10-CM

## 2013-01-31 LAB — CBC
HCT: 30 % — ABNORMAL LOW (ref 36.0–46.0)
Hemoglobin: 10.3 g/dL — ABNORMAL LOW (ref 12.0–15.0)
MCH: 29.6 pg (ref 26.0–34.0)
MCHC: 34.3 g/dL (ref 30.0–36.0)
MCV: 86.2 fL (ref 78.0–100.0)
PLATELETS: 328 10*3/uL (ref 150–400)
RBC: 3.48 MIL/uL — ABNORMAL LOW (ref 3.87–5.11)
RDW: 12.6 % (ref 11.5–15.5)
WBC: 14.4 10*3/uL — ABNORMAL HIGH (ref 4.0–10.5)

## 2013-01-31 MED ORDER — MISOPROSTOL 200 MCG PO TABS
800.0000 ug | ORAL_TABLET | Freq: Once | ORAL | Status: AC
  Administered 2013-01-31: 800 ug via VAGINAL
  Filled 2013-01-31: qty 4

## 2013-01-31 MED ORDER — IBUPROFEN 800 MG PO TABS
800.0000 mg | ORAL_TABLET | Freq: Once | ORAL | Status: AC
  Administered 2013-01-31: 800 mg via ORAL
  Filled 2013-01-31: qty 1

## 2013-01-31 MED ORDER — IBUPROFEN 800 MG PO TABS: 800.0000 mg | ORAL_TABLET | Freq: Three times a day (TID) | ORAL | Status: DC | PRN

## 2013-01-31 MED ORDER — TRAMADOL HCL 50 MG PO TABS: 50.0000 mg | ORAL_TABLET | Freq: Four times a day (QID) | ORAL | Status: DC | PRN

## 2013-01-31 NOTE — MAU Note (Signed)
Pt states passed a blood clot the size of an orange around 0145.  Also states has been having cramping as well.

## 2013-01-31 NOTE — MAU Provider Note (Signed)
History     CSN: [id number]  Arrival date and time: 01/31/13 4098   First Provider Initiated Contact with Patient 01/31/13 0424      Chief Complaint  Patient presents with  . Vaginal Bleeding   Vaginal Bleeding    Taylor Flowers is a 23 y.o. G2P0010 who is s/p delivery of previable infant with D&E on 01/26/12. She states that she has been bleeding since then, but today around 0145 she started to pass orange sized clots as well as have increased cramping and pain.   Past Medical History  Diagnosis Date  . Medical history non-contributory     Past Surgical History  Procedure Laterality Date  . Dilation and curretage    . Dilation and evacuation N/A 01/24/2013    Procedure: DILATATION AND EVACUATION;  Surgeon: Catalina Antigua, MD;  Location: [facility];  Service: Gynecology;  Laterality: N/A;    Family History  Problem Relation Age of Onset  . Hypertension Mother   . Hypertension Father   . Diabetes Father   . Vision loss Father     History  Substance Use Topics  . Smoking status: Never Smoker   . Smokeless tobacco: Never Used  . Alcohol Use: No    Allergies:  Allergies  Allergen Reactions  . Darvocet [Propoxyphene N-Acetaminophen] Other (See Comments)    Pt states she starts shaking"real bad"  . Flagyl [Metronidazole] Hives  . Latex Rash    Prescriptions prior to admission  Medication Sig Dispense Refill  . ibuprofen (ADVIL,MOTRIN) 600 MG tablet Take 1 tablet (600 mg total) by mouth every 6 (six) hours as needed (mild pain).  30 tablet  2  . Prenatal Vit-Fe Fumarate-FA (PRENATAL MULTIVITAMIN) TABS tablet Take 1 tablet by mouth daily at 12 noon.      . traMADol (ULTRAM) 50 MG tablet Take 1 tablet (50 mg total) by mouth every 6 (six) hours as needed for moderate pain.  30 tablet  0  . docusate sodium 100 MG CAPS Take 100 mg by mouth daily.  10 capsule  0  . methylergonovine (METHERGINE) 0.2 MG tablet Take 1 tablet (0.2 mg total) by mouth 4 (four) times daily.  2 tablet   0    Review of Systems  Genitourinary: Positive for vaginal bleeding.   Physical Exam   Blood pressure 137/96, pulse 111, temperature 98.3 F (36.8 C), temperature source Oral, resp. rate 18, height 6' (1.829 m), weight 132.45 kg (292 lb), last menstrual period 09/24/2012.  Physical Exam  Nursing note and vitals reviewed. Constitutional: She is oriented to person, place, and time. She appears well-developed and well-nourished. No distress.  Cardiovascular: Normal rate.   Respiratory: Effort normal.  GI: Soft. There is tenderness.  Neurological: She is alert and oriented to person, place, and time.  Skin: Skin is warm and dry.  Psychiatric: She has a normal mood and affect.    MAU Course  Procedures  Results for orders placed during the hospital encounter of 01/31/13 (from the past 24 hour(s))  CBC     Status: Abnormal   Collection Time    01/31/13  3:10 AM      Result Value Range   WBC 14.4 (*) 4.0 - 10.5 K/uL   RBC 3.48 (*) 3.87 - 5.11 MIL/uL   Hemoglobin 10.3 (*) 12.0 - 15.0 g/dL   HCT 11.9 (*) 14.7 - 82.9 %   MCV 86.2  78.0 - 100.0 fL   MCH 29.6  26.0 - 34.0 pg  MCHC 34.3  30.0 - 36.0 g/dL   RDW 16.1  09.6 - 04.5 %   Platelets 328  150 - 400 K/uL   US Transvaginal Non-ob  01/31/2013   CLINICAL DATA:  Vaginal bleeding after. Seventeen week fetal loss and dilation and curettage.  EXAM: TRANSABDOMINAL AND TRANSVAGINAL ULTRASOUND OF PELVIS  TECHNIQUE: Both transabdominal and transvaginal ultrasound examinations of the pelvis were performed. Transabdominal technique was performed for global imaging of the pelvis including uterus, ovaries, adnexal regions, and pelvic cul-de-sac. It was necessary to proceed with endovaginal exam following the transabdominal exam to visualize the endometrium.  COMPARISON:  None  FINDINGS: Uterus  13 x 8 x 10 cm. There is heterogeneous, predominant hyperechoic, material within the endometrial canal, measuring up to 25 mm in thickness. Color and  power Doppler imaging shows vascularization of the tissue.  Right ovary  Measurements: 3.7 x 2.2 x 1.9 cm. Normal appearance/no adnexal mass.  Left ovary  Measurements: 5.7 x 3.4 x 2.8 cm. Size differential related to a 3 cm intra-ovarian cyst.  Other findings  No free fluid.  IMPRESSION: Positive for retained products of conception.   Electronically Signed   By: Tiburcio Pea M.D.   On: 01/31/2013 04:13   US Pelvis Complete  01/31/2013   CLINICAL DATA:  Vaginal bleeding after. Seventeen week fetal loss and dilation and curettage.  EXAM: TRANSABDOMINAL AND TRANSVAGINAL ULTRASOUND OF PELVIS  TECHNIQUE: Both transabdominal and transvaginal ultrasound examinations of the pelvis were performed. Transabdominal technique was performed for global imaging of the pelvis including uterus, ovaries, adnexal regions, and pelvic cul-de-sac. It was necessary to proceed with endovaginal exam following the transabdominal exam to visualize the endometrium.  COMPARISON:  None  FINDINGS: Uterus  13 x 8 x 10 cm. There is heterogeneous, predominant hyperechoic, material within the endometrial canal, measuring up to 25 mm in thickness. Color and power Doppler imaging shows vascularization of the tissue.  Right ovary  Measurements: 3.7 x 2.2 x 1.9 cm. Normal appearance/no adnexal mass.  Left ovary  Measurements: 5.7 x 3.4 x 2.8 cm. Size differential related to a 3 cm intra-ovarian cyst.  Other findings  No free fluid.  IMPRESSION: Positive for retained products of conception.   Electronically Signed   By: Tiburcio Pea M.D.   On: 01/31/2013 04:13    0434: D/W Dr. Shawnie Pons: will give 800 mcg of Cytotec at this time, and have her FU in the clinic this week.    Assessment and Plan   1. Retained products of conception following abortion    Bleeding precautions Cytotec given here in MAU    Medication List         DSS 100 MG Caps  Take 100 mg by mouth daily.     ibuprofen 600 MG tablet  Commonly known as:  ADVIL,MOTRIN   Take 1 tablet (600 mg total) by mouth every 6 (six) hours as needed (mild pain).     ibuprofen 800 MG tablet  Commonly known as:  ADVIL,MOTRIN  Take 1 tablet (800 mg total) by mouth every 8 (eight) hours as needed.     methylergonovine 0.2 MG tablet  Commonly known as:  METHERGINE  Take 1 tablet (0.2 mg total) by mouth 4 (four) times daily.     prenatal multivitamin Tabs tablet  Take 1 tablet by mouth daily at 12 noon.     traMADol 50 MG tablet  Commonly known as:  ULTRAM  Take 1 tablet (50 mg total) by mouth every  6 (six) hours as needed for moderate pain.     traMADol 50 MG tablet  Commonly known as:  ULTRAM  Take 1 tablet (50 mg total) by mouth every 6 (six) hours as needed.       Follow-up Information   [facility] (BY FRIDAY, They will call you with an appointment )    Specialty:  Obstetrics and Gynecology   Contact information:   [address] [place] [address] [phone]       Tawnya CrookHogan, Davelyn Gwinn Donovan 01/31/2013, 4:27 AM

## 2013-01-31 NOTE — Discharge Instructions (Signed)
°  HOME CARE INSTRUCTIONS   Your caregiver may order bed rest or may allow you to continue light activity. Resume activity as directed by your caregiver.  Have someone help with home and family responsibilities during this time.   Keep track of the number of sanitary pads you use each day and how soaked (saturated) they are. Write down this information.   Do not use tampons. Do not douche or have sexual intercourse until approved by your caregiver.   Only take over-the-counter or prescription medicines for pain or discomfort as directed by your caregiver.   Do not take aspirin. Aspirin can cause bleeding.   Keep all follow-up appointments with your caregiver.   If you or your partner have problems with grieving, talk to your caregiver or seek counseling to help cope with the pregnancy loss. Allow enough time to grieve before trying to get pregnant again.  SEEK IMMEDIATE MEDICAL CARE IF:   You have severe cramps or pain in your back or abdomen.  You have a fever.  You pass large blood clots (walnut-sized or larger) ortissue from your vagina. Save any tissue for your caregiver to inspect.   Your bleeding increases.   You have a thick, bad-smelling vaginal discharge.  You become lightheaded, weak, or you faint.   You have chills.  MAKE SURE YOU:  Understand these instructions.  Will watch your condition.  Will get help right away if you are not doing well or get worse. Document Released: 07/01/2000 Document Revised: 05/02/2012 Document Reviewed: 02/24/2011 [facility] Patient Information 2014 [place]ExitCare, [place]LL[place].

## 2013-01-31 NOTE — MAU Provider Note (Signed)
Attestation of Attending Supervision of Advanced Practitioner (PA/CNM/NP): Evaluation and management procedures were performed by the Advanced Practitioner under my supervision and collaboration.  I have reviewed the Advanced Practitioner's note and chart, and I agree with the management and plan.  Dam Ashraf S, MD [facility] Attending 01/31/2013 7:19 AM   

## 2013-02-03 ENCOUNTER — Encounter: Payer: Self-pay | Admitting: Obstetrics & Gynecology

## 2013-02-03 ENCOUNTER — Ambulatory Visit (INDEPENDENT_AMBULATORY_CARE_PROVIDER_SITE_OTHER): Payer: PRIVATE HEALTH INSURANCE | Admitting: Obstetrics & Gynecology

## 2013-02-03 VITALS — BP 133/87 | HR 96 | Temp 97.3°F | Resp 20 | Ht 72.0 in | Wt 290.3 lb

## 2013-02-03 DIAGNOSIS — Z09 Encounter for follow-up examination after completed treatment for conditions other than malignant neoplasm: Secondary | ICD-10-CM

## 2013-02-03 DIAGNOSIS — Z0000 Encounter for general adult medical examination without abnormal findings: Secondary | ICD-10-CM

## 2013-02-03 NOTE — Progress Notes (Signed)
Pt states she had cytotec on 1/13. She had heavy bleeding, cramping and passed clots x1 day.

## 2013-02-03 NOTE — Progress Notes (Signed)
   Subjective:    Patient ID: Taylor Flowers, female    DOB: 01/23/1990, 23 y.o.   MRN: [id number]  HPI  23 yo S AA G2P0 with h/o incompetent cervix and delivery of a 17 week fetus this month, followed by a d&e for retained placenta. She reports only minimal vaginal bleeding. She has no complaints, reports normal bowel and bladder function. She has not had sex yet and knows to wait a month for that. She would like another pregnancy in the near future and knows to continue her PNVs. She plans to use condoms for contraception.    Review of Systems She has not had a pap smear since age 23.    Objective:   Physical Exam  Normal cervix with slight blood tinged mucous. I obtained a pap smear. Normal bimanual exam      Assessment & Plan:  Pp/po- doing well With a diagnosis of incompetent cervix, I stressed the importance of getting early OB care at the [facility] clinic.

## 2013-04-24 ENCOUNTER — Inpatient Hospital Stay (HOSPITAL_COMMUNITY)
Admission: AD | Admit: 2013-04-24 | Discharge: 2013-04-24 | Disposition: A | Discharge status: Home / Self Care | Payer: PRIVATE HEALTH INSURANCE | Source: Ambulatory Visit | Attending: Obstetrics and Gynecology | Admitting: Obstetrics and Gynecology

## 2013-04-24 ENCOUNTER — Encounter (HOSPITAL_COMMUNITY): Payer: Self-pay

## 2013-04-24 ENCOUNTER — Inpatient Hospital Stay (HOSPITAL_COMMUNITY): Payer: PRIVATE HEALTH INSURANCE

## 2013-04-24 DIAGNOSIS — B373 Candidiasis of vulva and vagina: Secondary | ICD-10-CM | POA: Diagnosis not present

## 2013-04-24 DIAGNOSIS — B3731 Acute candidiasis of vulva and vagina: Secondary | ICD-10-CM | POA: Diagnosis not present

## 2013-04-24 DIAGNOSIS — R109 Unspecified abdominal pain: Secondary | ICD-10-CM | POA: Diagnosis present

## 2013-04-24 DIAGNOSIS — O2390 Unspecified genitourinary tract infection in pregnancy, unspecified trimester: Secondary | ICD-10-CM | POA: Insufficient documentation

## 2013-04-24 DIAGNOSIS — O26899 Other specified pregnancy related conditions, unspecified trimester: Secondary | ICD-10-CM

## 2013-04-24 LAB — URINALYSIS, ROUTINE W REFLEX MICROSCOPIC
BILIRUBIN URINE: NEGATIVE
Glucose, UA: NEGATIVE mg/dL
Hgb urine dipstick: NEGATIVE
KETONES UR: NEGATIVE mg/dL
Leukocytes, UA: NEGATIVE
NITRITE: NEGATIVE
Protein, ur: NEGATIVE mg/dL
Specific Gravity, Urine: 1.015 (ref 1.005–1.030)
Urobilinogen, UA: 0.2 mg/dL (ref 0.0–1.0)
pH: 7 (ref 5.0–8.0)

## 2013-04-24 LAB — CBC
HEMATOCRIT: 39.6 % (ref 36.0–46.0)
Hemoglobin: 13.5 g/dL (ref 12.0–15.0)
MCH: 29.4 pg (ref 26.0–34.0)
MCHC: 34.1 g/dL (ref 30.0–36.0)
MCV: 86.3 fL (ref 78.0–100.0)
PLATELETS: 266 10*3/uL (ref 150–400)
RBC: 4.59 MIL/uL (ref 3.87–5.11)
RDW: 13.2 % (ref 11.5–15.5)
WBC: 9 10*3/uL (ref 4.0–10.5)

## 2013-04-24 LAB — WET PREP, GENITAL
Trich, Wet Prep: NONE SEEN
Yeast Wet Prep HPF POC: NONE SEEN

## 2013-04-24 LAB — POCT PREGNANCY, URINE: Preg Test, Ur: POSITIVE — AB

## 2013-04-24 LAB — HCG, QUANTITATIVE, PREGNANCY: hCG, Beta Chain, Quant, S: 224 m[IU]/mL — ABNORMAL HIGH (ref <5)

## 2013-04-24 MED ORDER — FLUCONAZOLE 150 MG PO TABS: 150.0000 mg | ORAL_TABLET | Freq: Once | ORAL | Status: DC

## 2013-04-24 MED ORDER — FLUCONAZOLE 150 MG PO TABS
150.0000 mg | ORAL_TABLET | Freq: Once | ORAL | Status: DC
  Filled 2013-04-24: qty 1

## 2013-04-24 MED ORDER — TERCONAZOLE 0.4 % VA CREA: 1.0000 | TOPICAL_CREAM | Freq: Every day | VAGINAL | Status: DC

## 2013-04-24 NOTE — Discharge Instructions (Signed)
Ectopic Pregnancy °An ectopic pregnancy is when the fertilized egg attaches (implants) outside the uterus. Most ectopic pregnancies occur in the fallopian tube. Rarely do ectopic pregnancies occur on the ovary, intestine, pelvis, or cervix. In an ectopic pregnancy, the fertilized egg does not have the ability to develop into a normal, healthy baby.  °A ruptured ectopic pregnancy is one in which the fallopian tube gets torn or bursts and results in internal bleeding. Often there is intense abdominal pain, and sometimes, vaginal bleeding. Having an ectopic pregnancy can be life threatening. If left untreated, this dangerous condition can lead to a blood transfusion, abdominal surgery, or even death. °CAUSES  °Damage to the fallopian tubes is the suspected cause in most ectopic pregnancies.  °RISK FACTORS °Depending on your circumstances, the risk of having an ectopic pregnancy will vary. The level of risk can be divided into three categories. °High Risk °· You have gone through infertility treatment. °· You have had a previous ectopic pregnancy. °· You have had previous tubal surgery. °· You have had previous surgery to have the fallopian tubes tied (tubal ligation). °· You have tubal problems or diseases. °· You have been exposed to DES. DES is a medicine that was used until 1971 and had effects on babies whose mothers took the medicine. °· You become pregnant while using an intrauterine device (IUD) for birth control.  °Moderate Risk °· You have a history of infertility. °· You have a history of a sexually transmitted infection (STI). °· You have a history of pelvic inflammatory disease (PID). °· You have scarring from endometriosis. °· You have multiple sexual partners. °· You smoke.  °Low Risk °· You have had previous pelvic surgery. °· You use vaginal douching. °· You became sexually active before 24 years of age. °SIGNS AND SYMPTOMS  °An ectopic pregnancy should be suspected in anyone who has missed a period and  has abdominal pain or bleeding. °· You may experience normal pregnancy symptoms, such as: °· Nausea. °· Tiredness. °· Breast tenderness. °· Other symptoms may include: °· Pain with intercourse. °· Irregular vaginal bleeding or spotting. °· Cramping or pain on one side or in the lower abdomen. °· Fast heartbeat. °· Passing out while having a bowel movement. °· Symptoms of a ruptured ectopic pregnancy and internal bleeding may include: °· Sudden, severe pain in the abdomen and pelvis. °· Dizziness or fainting. °· Pain in the shoulder area. °DIAGNOSIS  °Tests that may be performed include: °· A pregnancy test. °· An ultrasound test. °· Testing the specific level of pregnancy hormone in the bloodstream. °· Taking a sample of uterus tissue (dilation and curettage, D&C). °· Surgery to perform a visual exam of the inside of the abdomen using a thin, lighted tube with a tiny camera on the end (laparoscope). °TREATMENT  °An injection of a medicine called methotrexate may be given. This medicine causes the pregnancy tissue to be absorbed. It is given if: °· The diagnosis is made early. °· The fallopian tube has not ruptured. °· You are considered to be a good candidate for the medicine. °Usually, pregnancy hormone blood levels are checked after methotrexate treatment. This is to be sure the medicine is effective. It may take 4 6 weeks for the pregnancy to be absorbed (though most pregnancies will be absorbed by 3 weeks). °Surgical treatment may be needed. A laparoscope may be used to remove the pregnancy tissue. If severe internal bleeding occurs, a cut (incision) may be made in the lower abdomen (laparotomy), and the   ectopic pregnancy is removed. This stops the bleeding. Part of the fallopian tube, or the whole tube, may be removed as well (salpingectomy). After surgery, pregnancy hormone tests may be done to be sure there is no pregnancy tissue left. You may receive an Rho(D) immune globulin shot if you are Rh negative and  the father is Rh positive, or if you do not know the Rh type of the father. This is to prevent problems with any future pregnancy. SEEK IMMEDIATE MEDICAL CARE IF:  You have any symptoms of an ectopic pregnancy. This is a medical emergency. Document Released: 02/13/2004 Document Revised: 10/26/2012 Document Reviewed: 08/04/2012 [facility] Patient Information 2014 [place], [place]. Yeast Vaginitis Vaginitis in a soreness, swelling and redness (inflammation) of the vagina and vulva. Monilial vaginitis is not a sexually transmitted infection. CAUSES  Yeast vaginitis is caused by yeast (candida) that is normally found in your vagina. With a yeast infection, the candida has overgrown in number to a point that upsets the chemical balance. SYMPTOMS   White, thick vaginal discharge.  Swelling, itching, redness and irritation of the vagina and possibly the lips of the vagina (vulva).  Burning or painful urination.  Painful intercourse. DIAGNOSIS  Things that may contribute to monilial vaginitis are:  Postmenopausal and virginal states.  Pregnancy.  Infections.  Being tired, sick or stressed, especially if you had monilial vaginitis in the past.  Diabetes. Good control will help lower the chance.  Birth control pills.  Tight fitting garments.  Using bubble bath, feminine sprays, douches or deodorant tampons.  Taking certain medications that kill germs (antibiotics).  Sporadic recurrence can occur if you become ill. TREATMENT  Your caregiver will give you medication.  There are several kinds of anti monilial vaginal creams and suppositories specific for monilial vaginitis. For recurrent yeast infections, use a suppository or cream in the vagina 2 times a week, or as directed.  Anti-monilial or steroid cream for the itching or irritation of the vulva may also be used. Get your caregiver's permission.  Painting the vagina with methylene blue solution may help if the monilial cream does  not work.  Eating yogurt may help prevent monilial vaginitis. HOME CARE INSTRUCTIONS   Finish all medication as prescribed.  Do not have sex until treatment is completed or after your caregiver tells you it is okay.  Take warm sitz baths.  Do not douche.  Do not use tampons, especially scented ones.  Wear cotton underwear.  Avoid tight pants and panty hose.  Tell your sexual partner that you have a yeast infection. They should go to their caregiver if they have symptoms such as mild rash or itching.  Your sexual partner should be treated as well if your infection is difficult to eliminate.  Practice safer sex. Use condoms.  Some vaginal medications cause latex condoms to fail. Vaginal medications that harm condoms are:  Cleocin cream.  Butoconazole (Femstat).  Terconazole (Terazol) vaginal suppository.  Miconazole (Monistat) (may be purchased over the counter). SEEK MEDICAL CARE IF:   You have a temperature by mouth above 102 F (38.9 C).  The infection is getting worse after 2 days of treatment.  The infection is not getting better after 3 days of treatment.  You develop blisters in or around your vagina.  You develop vaginal bleeding, and it is not your menstrual period.  You have pain when you urinate.  You develop intestinal problems.  You have pain with sexual intercourse. Document Released: 10/15/2004 Document Revised: 03/30/2011 Document Reviewed:  06/29/2008 [employer] Patient Information 2014 [place].

## 2013-04-24 NOTE — MAU Note (Signed)
Positive HPT today and last Friday. "burning" sensation in lower abdomen; started Saturday. Denies vaginal bleeding or discharge. History of ectopic pregnancy in 2009 with surgery.

## 2013-04-24 NOTE — MAU Provider Note (Signed)
Chief Complaint: Abdominal Pain and Possible Pregnancy   First Provider Initiated Contact with Patient 04/24/13 0328     SUBJECTIVE HPI: Taylor Flowers is a 23 y.o. G3P0020 at 5w by LMP who presents to maternity admissions reporting burning lower abdominal and vaginal pain with positive pregnancy test at home. She reports she has itching inside her vagina and a sensation of heat in her vagina and lower abdomen. Pt reports D&C in 2009 for ectopic pregnancy (no MTX or salpingectomy per pt) and D&E following previable 17 week delivery in January 2015 for incompetent cervix.  She denies urinary symptoms, h/a, dizziness, n/v, or fever/chills.     Past Medical History  Diagnosis Date  . Medical history non-contributory    Past Surgical History  Procedure Laterality Date  . Dilation and curretage    . Dilation and evacuation N/A 01/24/2013    Procedure: DILATATION AND EVACUATION;  Surgeon: Catalina Antigua, MD;  Location: [facility];  Service: Gynecology;  Laterality: N/A;   History   Social History  . Marital Status: Single    Spouse Name: N/A    Number of Children: N/A  . Years of Education: N/A   Occupational History  . Not on file.   Social History Main Topics  . Smoking status: Never Smoker   . Smokeless tobacco: Never Used  . Alcohol Use: No  . Drug Use: No  . Sexual Activity: Not Currently    Birth Control/ Protection: None     Comment: last sex three days ago   Other Topics Concern  . Not on file   Social History Narrative  . No narrative on file   No current facility-administered medications on file prior to encounter.   Current Outpatient Prescriptions on File Prior to Encounter  Medication Sig Dispense Refill  . Prenatal Vit-Fe Fumarate-FA (PRENATAL MULTIVITAMIN) TABS tablet Take 1 tablet by mouth daily at 12 noon.      Marland Kitchen acetaminophen (TYLENOL) 500 MG tablet Take 500 mg by mouth every 6 (six) hours as needed.      . docusate sodium 100 MG CAPS Take 100 mg by mouth daily.   10 capsule  0   Allergies  Allergen Reactions  . Darvocet [Propoxyphene N-Acetaminophen] Other (See Comments)    Pt states she starts shaking"real bad"  . Flagyl [Metronidazole] Hives  . Latex Rash    ROS: Pertinent items in HPI  OBJECTIVE Blood pressure 127/90, pulse 100, temperature 99.2 F (37.3 C), temperature source Oral, resp. rate 18, height 6' (1.829 m), weight 137.984 kg (304 lb 3.2 oz), last menstrual period 03/17/2013, SpO2 100.00%. GENERAL: Well-developed, well-nourished female in no acute distress.  HEENT: Normocephalic HEART: normal rate RESP: normal effort ABDOMEN: Soft, non-tender EXTREMITIES: Nontender, no edema NEURO: Alert and oriented Pelvic exam: Cervix pink, visually closed, slightly friable with cotton swab, large amount thick white discharge clinging to cervix and vaginal walls, vaginal walls and external genitalia normal Bimanual exam: Cervix 0/long/high, firm, anterior, neg CMT, uterus nontender, nonenlarged, adnexa without tenderness, enlargement, or mass  LAB RESULTS Results for orders placed during the hospital encounter of 04/24/13 (from the past 24 hour(s))  URINALYSIS, ROUTINE W REFLEX MICROSCOPIC     Status: None   Collection Time    04/24/13  2:12 AM      Result Value Ref Range   Color, Urine YELLOW  YELLOW   APPearance CLEAR  CLEAR   Specific Gravity, Urine 1.015  1.005 - 1.030   pH 7.0  5.0 - 8.0  Glucose, UA NEGATIVE  NEGATIVE mg/dL   Hgb urine dipstick NEGATIVE  NEGATIVE   Bilirubin Urine NEGATIVE  NEGATIVE   Ketones, ur NEGATIVE  NEGATIVE mg/dL   Protein, ur NEGATIVE  NEGATIVE mg/dL   Urobilinogen, UA 0.2  0.0 - 1.0 mg/dL   Nitrite NEGATIVE  NEGATIVE   Leukocytes, UA NEGATIVE  NEGATIVE  POCT PREGNANCY, URINE     Status: Abnormal   Collection Time    04/24/13  2:23 AM      Result Value Ref Range   Preg Test, Ur POSITIVE (*) NEGATIVE  HCG, QUANTITATIVE, PREGNANCY     Status: Abnormal   Collection Time    04/24/13  3:02 AM       Result Value Ref Range   hCG, Beta Chain, Quant, S 224 (*) <5 mIU/mL  CBC     Status: None   Collection Time    04/24/13  3:05 AM      Result Value Ref Range   WBC 9.0  4.0 - 10.5 K/uL   RBC 4.59  3.87 - 5.11 MIL/uL   Hemoglobin 13.5  12.0 - 15.0 g/dL   HCT 16.1  09.6 - 04.5 %   MCV 86.3  78.0 - 100.0 fL   MCH 29.4  26.0 - 34.0 pg   MCHC 34.1  30.0 - 36.0 g/dL   RDW 40.9  81.1 - 91.4 %   Platelets 266  150 - 400 K/uL  WET PREP, GENITAL     Status: Abnormal   Collection Time    04/24/13  3:35 AM      Result Value Ref Range   Yeast Wet Prep HPF POC NONE SEEN  NONE SEEN   Trich, Wet Prep NONE SEEN  NONE SEEN   Clue Cells Wet Prep HPF POC FEW (*) NONE SEEN   WBC, Wet Prep HPF POC FEW (*) NONE SEEN    IMAGING US Ob Comp Less 14 Wks  04/24/2013   CLINICAL DATA:  Abdominal pain.  EXAM: OBSTETRIC <14 WK [place] AND TRANSVAGINAL OB US  TECHNIQUE: Both transabdominal and transvaginal ultrasound examinations were performed for complete evaluation of the gestation as well as the maternal uterus, adnexal regions, and pelvic cul-de-sac. Transvaginal technique was performed to assess early pregnancy.  COMPARISON:  Pelvic ultrasound performed 01/31/2013  FINDINGS: Intrauterine gestational sac:  None seen  Yolk sac:  N/A  Embryo:  N/A  Maternal uterus/adnexae: No subchorionic hemorrhage is seen. The uterus is unremarkable in appearance. A tiny cystic focus adjacent to the endometrial echo complex likely reflects underlying chronic change.  The ovaries are within normal limits. The right ovary measures 5.7 x 3.2 x 2.2 cm, while the left ovary measures 3.1 x 2.2 x 2.6 cm. No suspicious adnexal masses are seen; there is no evidence for ovarian torsion.  A small amount of free fluid is seen within the pelvic cul-de-sac.  IMPRESSION: No intrauterine gestational sac yet seen. This remains within normal limits, given the quantitative beta HCG level of 224. If quantitative beta HCG levels continue trending upward,  follow-up pelvic ultrasound could be considered in 1-2 weeks to assess for intrauterine gestational sac and exclude ectopic pregnancy.   Electronically Signed   By: Roanna Raider M.D.   On: 04/24/2013 04:29   US Ob Transvaginal  04/24/2013   CLINICAL DATA:  Abdominal pain.  EXAM: OBSTETRIC <14 WK [place] AND TRANSVAGINAL OB US  TECHNIQUE: Both transabdominal and transvaginal ultrasound examinations were performed for complete evaluation of the gestation  as well as the maternal uterus, adnexal regions, and pelvic cul-de-sac. Transvaginal technique was performed to assess early pregnancy.  COMPARISON:  Pelvic ultrasound performed 01/31/2013  FINDINGS: Intrauterine gestational sac:  None seen  Yolk sac:  N/A  Embryo:  N/A  Maternal uterus/adnexae: No subchorionic hemorrhage is seen. The uterus is unremarkable in appearance. A tiny cystic focus adjacent to the endometrial echo complex likely reflects underlying chronic change.  The ovaries are within normal limits. The right ovary measures 5.7 x 3.2 x 2.2 cm, while the left ovary measures 3.1 x 2.2 x 2.6 cm. No suspicious adnexal masses are seen; there is no evidence for ovarian torsion.  A small amount of free fluid is seen within the pelvic cul-de-sac.  IMPRESSION: No intrauterine gestational sac yet seen. This remains within normal limits, given the quantitative beta HCG level of 224. If quantitative beta HCG levels continue trending upward, follow-up pelvic ultrasound could be considered in 1-2 weeks to assess for intrauterine gestational sac and exclude ectopic pregnancy.   Electronically Signed   By: Roanna RaiderJeffery  Chang M.D.   On: 04/24/2013 04:29    ASSESSMENT 1. Vaginal candidiasis   2. Abdominal pain complicating pregnancy     PLAN Discharge home with ectopic precautions Return to MAU in 48 hours for repeat quant hcg Return sooner as needed in emergencies    Medication List    STOP taking these medications       ibuprofen 600 MG tablet  Commonly  known as:  ADVIL,MOTRIN     ibuprofen 800 MG tablet  Commonly known as:  ADVIL,MOTRIN     methylergonovine 0.2 MG tablet  Commonly known as:  METHERGINE     traMADol 50 MG tablet  Commonly known as:  ULTRAM      TAKE these medications       acetaminophen 500 MG tablet  Commonly known as:  TYLENOL  Take 500 mg by mouth every 6 (six) hours as needed.     DSS 100 MG Caps  Take 100 mg by mouth daily.     fluconazole 150 MG tablet  Commonly known as:  DIFLUCAN  Take 1 tablet (150 mg total) by mouth once.     prenatal multivitamin Tabs tablet  Take 1 tablet by mouth daily at 12 noon.       Follow-up Information   [facility] (Return on Wednesday morning for repeat labs.  Return sooner as needed in emergencies.)    Contact information:   [address] [id number] [place] [id number] [phone]      Sharen CounterLisa Leftwich-Kirby Certified Nurse-Midwife 04/24/2013  4:41 AM

## 2013-04-25 LAB — GC/CHLAMYDIA PROBE AMP
CT PROBE, AMP APTIMA: NEGATIVE
GC PROBE AMP APTIMA: NEGATIVE

## 2013-04-26 ENCOUNTER — Inpatient Hospital Stay (HOSPITAL_COMMUNITY)
Admission: AD | Admit: 2013-04-26 | Discharge: 2013-04-26 | Disposition: A | Discharge status: Home / Self Care | Payer: 59 | Source: Ambulatory Visit | Attending: Family Medicine | Admitting: Family Medicine

## 2013-04-26 ENCOUNTER — Encounter (HOSPITAL_COMMUNITY): Payer: Self-pay | Admitting: *Deleted

## 2013-04-26 DIAGNOSIS — B373 Candidiasis of vulva and vagina: Secondary | ICD-10-CM

## 2013-04-26 DIAGNOSIS — O0281 Inappropriate change in quantitative human chorionic gonadotropin (hCG) in early pregnancy: Secondary | ICD-10-CM

## 2013-04-26 DIAGNOSIS — O2390 Unspecified genitourinary tract infection in pregnancy, unspecified trimester: Secondary | ICD-10-CM | POA: Insufficient documentation

## 2013-04-26 DIAGNOSIS — Z09 Encounter for follow-up examination after completed treatment for conditions other than malignant neoplasm: Secondary | ICD-10-CM | POA: Insufficient documentation

## 2013-04-26 DIAGNOSIS — B3731 Acute candidiasis of vulva and vagina: Secondary | ICD-10-CM | POA: Insufficient documentation

## 2013-04-26 LAB — HCG, QUANTITATIVE, PREGNANCY: hCG, Beta Chain, Quant, S: 631 m[IU]/mL — ABNORMAL HIGH (ref <5)

## 2013-04-26 MED ORDER — FLUCONAZOLE 150 MG PO TABS
150.0000 mg | ORAL_TABLET | Freq: Once | ORAL | Status: AC
  Administered 2013-04-26: 150 mg via ORAL
  Filled 2013-04-26: qty 1

## 2013-04-26 NOTE — MAU Provider Note (Signed)
HPI:  [name] is a 23 y.o. female G3P0020 at Unknown gestation who presents for repeat beta hcg level.  The patient was seen two days ago for "burning in her abdomen". Quant was 224, US showed no IUP/ no yolk sac. Beta hcg level today was 631.  The patient denies pain or bleeding at this time. She was diagnosed with a yeast infection and could not afford terizol 7 cream. She went to the health department and got terizol 3. Pt is concerned that the medication is not working and will not be enough to treat the yeast infection.     Objective:  GENERAL: Well-developed, well-nourished female in no acute distress.  HEENT: Normocephalic, atraumatic.   LUNGS: Effort normal HEART: Regular rate  SKIN: Warm, dry and without erythema PSYCH: Normal mood and affect  Filed Vitals:   04/26/13 1009  BP: 136/85  Pulse: 89  Temp:   Resp:    Results for orders placed during the hospital encounter of 04/26/13 (from the past 48 hour(s))  HCG, QUANTITATIVE, PREGNANCY     Status: Abnormal   Collection Time    04/26/13  8:21 AM      Result Value Ref Range   hCG, Beta Chain, Quant, S 631 (*) <5 mIU/mL   Comment:              GEST. AGE      CONC.  (mIU/mL)       <=1 WEEK        5 - 50         2 WEEKS       50 - 500         3 WEEKS       100 - 10,000         4 WEEKS     1,000 - 30,000         5 WEEKS     3,500 - 115,000       6-8 WEEKS     12,000 - 270,000        12 WEEKS     15,000 - 220,000                FEMALE AND NON-PREGNANT FEMALE:         LESS THAN 5 mIU/mL    MDM Beta hcg  Diflucan 150 mg po given in MAU; pt states she has taken diflucan several times in the past without problems    A:  Appropriate rise in beta hcg level Cannot exclude ectopic pregnancy  Presumed yeast vaginitis   P:  Discharge home in stable condition Follow up US in 10 days; US will call to schedule appt Ectopic precautions discussed Pelvic rest Return to MAU if pain or bleeding occurs Start prenatal  care ASAP    Iona HansenJennifer Irene Kurtis Anastasia, NP 04/26/2013 8:13 PM

## 2013-04-26 NOTE — MAU Note (Signed)
Doing good.  Denies pain or bleeding.

## 2013-04-26 NOTE — MAU Note (Signed)
Was given a prescription for yeast infection treatment- unable to afford, went to health dept- they gave her something else- she is asking if that is ok.

## 2013-04-27 NOTE — MAU Provider Note (Signed)
Attestation of Attending Supervision of Advanced Practitioner (PA/CNM/NP): Evaluation and management procedures were performed by the Advanced Practitioner under my supervision and collaboration.  I have reviewed the Advanced Practitioner's note and chart, and I agree with the management and plan.  Nyriah Coote, DO Attending Physician Faculty Practice, [facility]  

## 2013-04-28 NOTE — MAU Provider Note (Signed)
Attestation of Attending Supervision of Advanced Practitioner: Evaluation and management procedures were performed by the PA/NP/CNM/OB Fellow under my supervision/collaboration. Chart reviewed and agree with management and plan.  Tilda BurrowJohn V Jaimon Bugaj 04/28/2013 7:42 AM

## 2013-05-05 ENCOUNTER — Ambulatory Visit (HOSPITAL_COMMUNITY)
Admission: RE | Admit: 2013-05-05 | Discharge: 2013-05-05 | Disposition: A | Discharge status: Home / Self Care | Payer: 59 | Source: Ambulatory Visit | Attending: Obstetrics and Gynecology | Admitting: Obstetrics and Gynecology

## 2013-05-05 DIAGNOSIS — Z3689 Encounter for other specified antenatal screening: Secondary | ICD-10-CM | POA: Insufficient documentation

## 2013-05-05 DIAGNOSIS — O0281 Inappropriate change in quantitative human chorionic gonadotropin (hCG) in early pregnancy: Secondary | ICD-10-CM

## 2013-05-06 ENCOUNTER — Telehealth: Payer: Self-pay | Admitting: Obstetrics and Gynecology

## 2013-05-06 NOTE — Telephone Encounter (Signed)
Discussed US findings with the patient.

## 2013-05-30 ENCOUNTER — Other Ambulatory Visit: Payer: Self-pay | Admitting: Obstetrics

## 2013-06-07 ENCOUNTER — Encounter (HOSPITAL_COMMUNITY): Payer: Self-pay | Admitting: *Deleted

## 2013-06-07 ENCOUNTER — Inpatient Hospital Stay (HOSPITAL_COMMUNITY): Payer: 59

## 2013-06-07 ENCOUNTER — Inpatient Hospital Stay (HOSPITAL_COMMUNITY)
Admission: AD | Admit: 2013-06-07 | Discharge: 2013-06-07 | Disposition: A | Discharge status: Home / Self Care | Payer: 59 | Source: Ambulatory Visit | Attending: Obstetrics | Admitting: Obstetrics

## 2013-06-07 DIAGNOSIS — O99891 Other specified diseases and conditions complicating pregnancy: Secondary | ICD-10-CM | POA: Insufficient documentation

## 2013-06-07 DIAGNOSIS — R030 Elevated blood-pressure reading, without diagnosis of hypertension: Secondary | ICD-10-CM | POA: Insufficient documentation

## 2013-06-07 DIAGNOSIS — O09291 Supervision of pregnancy with other poor reproductive or obstetric history, first trimester: Secondary | ICD-10-CM

## 2013-06-07 DIAGNOSIS — IMO0001 Reserved for inherently not codable concepts without codable children: Secondary | ICD-10-CM

## 2013-06-07 DIAGNOSIS — O09299 Supervision of pregnancy with other poor reproductive or obstetric history, unspecified trimester: Secondary | ICD-10-CM | POA: Insufficient documentation

## 2013-06-07 DIAGNOSIS — M549 Dorsalgia, unspecified: Secondary | ICD-10-CM | POA: Insufficient documentation

## 2013-06-07 DIAGNOSIS — O9989 Other specified diseases and conditions complicating pregnancy, childbirth and the puerperium: Principal | ICD-10-CM

## 2013-06-07 MED ORDER — OXYCODONE-ACETAMINOPHEN 5-325 MG PO TABS
1.0000 | ORAL_TABLET | Freq: Once | ORAL | Status: AC
  Administered 2013-06-07: 1 via ORAL
  Filled 2013-06-07: qty 1

## 2013-06-07 MED ORDER — OXYCODONE-ACETAMINOPHEN 5-325 MG PO TABS: 1.0000 | ORAL_TABLET | ORAL | Status: DC | PRN

## 2013-06-07 NOTE — MAU Provider Note (Signed)
Chief Complaint: Tailbone Pain   First Provider Initiated Contact with Patient 06/07/13 1306     SUBJECTIVE HPI: Taylor Flowers is a 23 y.o. G3P0020 at 7452w5d who presents with sudden onset of severe sharp tailbone pain after sneezing this morning. The pain prevents her from walking or laying flat. It is continuous but waxes and wanes and radiates to right buttock. No radicular pain to LE.  No previous similar episodes. No home treatment.  ROS: Denies leakage of fluid or vaginal bleeding. No dysuria, hematuria, urinary urgency or frequency. No abdominal pain.  PNC  Hx ectopic and then 17 wk PTCD/PPROM with induced SAB.  US normal CL 5 days ago in office. Scheduled for prophylactic cerclage next week.   Past Medical History  Diagnosis Date  . Medical history non-contributory    OB History  Gravida Para Term Preterm AB SAB TAB Ectopic Multiple Living  3 0   2 1  1   0    # Outcome Date GA Lbr Len/2nd Weight Sex Delivery Anes PTL Lv  3 CUR           2 SAB 01/24/13 4960w3d  0.119 kg (4.2 oz) M  None       Comments: incompetent cervix  1 ECT 06/25/07 8744w0d            Comments: surgery     Past Surgical History  Procedure Laterality Date  . Dilation and curretage    . Dilation and evacuation N/A 01/24/2013    Procedure: DILATATION AND EVACUATION;  Surgeon: Catalina AntiguaPeggy Constant, MD;  Location: [facility];  Service: Gynecology;  Laterality: N/A;   History   Social History  . Marital Status: Single    Spouse Name: N/A    Number of Children: N/A  . Years of Education: N/A   Occupational History  . Not on file.   Social History Main Topics  . Smoking status: Never Smoker   . Smokeless tobacco: Never Used  . Alcohol Use: No  . Drug Use: No  . Sexual Activity: Not Currently    Birth Control/ Protection: None     Comment: last sex three days ago   Other Topics Concern  . Not on file   Social History Narrative  . No narrative on file   No current facility-administered medications on file  prior to encounter.   Current Outpatient Prescriptions on File Prior to Encounter  Medication Sig Dispense Refill  . acetaminophen (TYLENOL) 500 MG tablet Take 500 mg by mouth every 6 (six) hours as needed for mild pain or headache.       . Prenatal Vit-Fe Fumarate-FA (PRENATAL MULTIVITAMIN) TABS tablet Take 1 tablet by mouth daily at 12 noon.       Allergies  Allergen Reactions  . Flagyl [Metronidazole] Anaphylaxis    Throat swelling  . Darvocet [Propoxyphene N-Acetaminophen] Other (See Comments)    Pt states she starts shaking"real bad"  . Latex Rash      OBJECTIVE Blood pressure 146/68, pulse 98, temperature 98.7 F (37.1 C), temperature source Oral, resp. rate 20, last menstrual period 03/17/2013. GENERAL: Obese female in some distress lying on left side. HEENT: Normocephalic HEART: normal rate RESP: normal effort ABDOMEN: Soft, non-tender BACK: No CVA tenderness. Point tenderness over lower sacrum. EXTREMITIES: Nontender, no edema NEURO: Alert and oriented Cx L/C  LAB RESULTS No results found for this or any previous visit (from the past 24 hour(s)).  IMAGING No results found. [place] Ob Comp Less 14 Wks  06/07/2013   CLINICAL DATA:  Fetal heart tones not auscultated  EXAM: OBSTETRIC <14 WK ULTRASOUND  TECHNIQUE: Transabdominal ultrasound was performed for evaluation of the gestation as well as the maternal uterus and adnexal regions.  COMPARISON:  US OB TRANSVAGINAL dated 05/05/2013  FINDINGS: Intrauterine gestational sac: Visualized/normal in shape.  Yolk sac:  Visualized  Embryo:  Visualized  Cardiac Activity: Visualized  Heart Rate: 165 bpm  CRL:   50  mm   11 w 5 d                  US EDC: 12/22/13  Maternal uterus/adnexae: The ovaries are normal.  IMPRESSION: Single live intrauterine gestation with concordant growth compared to assigned gestational age of [redacted] weeks 5 days. No acute abnormality.   Electronically Signed   By: Christiana PellantGretchen  Green M.D.   On: 06/07/2013 16:23   MAU  COURSE Percocet 1 tab with some relief.  Ice application  ASSESSMENT 1. Back pain, acute   2. Prior poor obstetrical history in first trimester, antepartum   3. Elevated BP     PLAN Discharge home    Medication List         acetaminophen 500 MG tablet  Commonly known as:  TYLENOL  Take 500 mg by mouth every 6 (six) hours as needed for mild pain or headache.     oxyCODONE-acetaminophen 5-325 MG per tablet  Commonly known as:  PERCOCET/ROXICET  Take 1 tablet by mouth every 4 (four) hours as needed.     prenatal multivitamin Tabs tablet  Take 1 tablet by mouth daily at 12 noon.       Follow-up Information   Follow up with Kathreen CosierMARSHALL,BERNARD A, MD. (Keep your scheduled prenatal appointment)    Specialty:  Obstetrics and Gynecology   Contact information:   [address] [place] [address] [phone]       Danae Orleanseirdre C Coleson Kant, CNM 06/07/2013  1:08 PM

## 2013-06-07 NOTE — MAU Note (Signed)
Pt presented by EMS. No rooms available. Pt brought into triage.

## 2013-06-07 NOTE — Discharge Instructions (Signed)

## 2013-06-07 NOTE — MAU Note (Signed)
Around 1100 was eating , sneezed and heard something  Crack in tailbone- has been hurting since.  Can hardly walk, can't sit straight. Scheduled for cerclage placement  Next week

## 2013-06-13 ENCOUNTER — Encounter (HOSPITAL_COMMUNITY): Payer: 59 | Admitting: Anesthesiology

## 2013-06-13 ENCOUNTER — Ambulatory Visit (HOSPITAL_COMMUNITY): Payer: 59 | Admitting: Anesthesiology

## 2013-06-13 ENCOUNTER — Ambulatory Visit (HOSPITAL_COMMUNITY)
Admission: RE | Admit: 2013-06-13 | Discharge: 2013-06-13 | Disposition: A | Discharge status: Home / Self Care | Payer: 59 | Source: Ambulatory Visit | Attending: Obstetrics | Admitting: Obstetrics

## 2013-06-13 ENCOUNTER — Encounter (HOSPITAL_COMMUNITY)
Admission: RE | Disposition: A | Discharge status: Home / Self Care | Payer: Self-pay | Source: Ambulatory Visit | Attending: Obstetrics

## 2013-06-13 DIAGNOSIS — O3430 Maternal care for cervical incompetence, unspecified trimester: Secondary | ICD-10-CM | POA: Insufficient documentation

## 2013-06-13 HISTORY — PX: CERVICAL CERCLAGE: SHX1329

## 2013-06-13 LAB — CBC
HEMATOCRIT: 39.9 % (ref 36.0–46.0)
Hemoglobin: 13.7 g/dL (ref 12.0–15.0)
MCH: 29.1 pg (ref 26.0–34.0)
MCHC: 34.3 g/dL (ref 30.0–36.0)
MCV: 84.7 fL (ref 78.0–100.0)
PLATELETS: 280 10*3/uL (ref 150–400)
RBC: 4.71 MIL/uL (ref 3.87–5.11)
RDW: 14.5 % (ref 11.5–15.5)
WBC: 12.3 10*3/uL — ABNORMAL HIGH (ref 4.0–10.5)

## 2013-06-13 SURGERY — CERCLAGE, CERVIX, VAGINAL APPROACH
Anesthesia: Spinal | Site: Cervix

## 2013-06-13 MED ORDER — ONDANSETRON HCL 4 MG/2ML IJ SOLN: 4.0000 mg | Freq: Once | INTRAMUSCULAR | Status: DC | PRN

## 2013-06-13 MED ORDER — FENTANYL CITRATE 0.05 MG/ML IJ SOLN
25.0000 ug | INTRAMUSCULAR | Status: DC | PRN
  Administered 2013-06-13: 50 ug via INTRAVENOUS

## 2013-06-13 MED ORDER — FENTANYL CITRATE 0.05 MG/ML IJ SOLN
INTRAMUSCULAR | Status: AC
  Administered 2013-06-13: 50 ug via INTRAVENOUS
  Filled 2013-06-13: qty 2

## 2013-06-13 MED ORDER — LIDOCAINE IN DEXTROSE 5-7.5 % IV SOLN
INTRAVENOUS | Status: DC | PRN
  Administered 2013-06-13: 1.2 mL via INTRATHECAL

## 2013-06-13 MED ORDER — LACTATED RINGERS IV SOLN
INTRAVENOUS | Status: DC
  Administered 2013-06-13 (×2): via INTRAVENOUS

## 2013-06-13 MED ORDER — MEPERIDINE HCL 25 MG/ML IJ SOLN
6.2500 mg | INTRAMUSCULAR | Status: DC | PRN
Start: 2013-06-13 — End: 2013-06-13

## 2013-06-13 SURGICAL SUPPLY — 23 items
CATH FOLEY LATEX FREE 14FR (CATHETERS) ×2
CATH FOLEY LF 14FR (CATHETERS) ×1 IMPLANT
CLOTH BEACON ORANGE TIMEOUT ST (SAFETY) ×3 IMPLANT
COUNTER NEEDLE 1200 MAGNETIC (NEEDLE) IMPLANT
GLOVE BIOGEL PI IND STRL 7.0 (GLOVE) ×4 IMPLANT
GLOVE BIOGEL PI INDICATOR 7.0 (GLOVE) ×8
GLOVE SURG SS PI 7.0 STRL IVOR (GLOVE) ×15 IMPLANT
GLOVE SURG SS PI 8.5 STRL IVOR (GLOVE) ×6
GLOVE SURG SS PI 8.5 STRL STRW (GLOVE) ×3 IMPLANT
GOWN STRL REUS W/TWL 2XL LVL3 (GOWN DISPOSABLE) ×3 IMPLANT
GOWN STRL REUS W/TWL LRG LVL3 (GOWN DISPOSABLE) ×6 IMPLANT
LEGGING LITHOTOMY PAIR STRL (DRAPES) ×3 IMPLANT
NEEDLE MA TROC 1/2 (NEEDLE) IMPLANT
NEEDLE MAYO .5 CIRCLE (NEEDLE) ×3 IMPLANT
PACK VAGINAL MINOR WOMEN LF (CUSTOM PROCEDURE TRAY) ×3 IMPLANT
PAD OB MATERNITY 4.3X12.25 (PERSONAL CARE ITEMS) ×3 IMPLANT
PAD PREP 24X48 CUFFED NSTRL (MISCELLANEOUS) ×3 IMPLANT
SUT MERSILENE 5MM BP 1 12 (SUTURE) ×3 IMPLANT
TOWEL OR 17X24 6PK STRL BLUE (TOWEL DISPOSABLE) ×6 IMPLANT
TUBING NON-CON 1/4 X 20 CONN (TUBING) IMPLANT
TUBING NON-CON 1/4 X 20' CONN (TUBING)
WATER STERILE IRR 1000ML POUR (IV SOLUTION) ×3 IMPLANT
YANKAUER SUCT BULB TIP NO VENT (SUCTIONS) IMPLANT

## 2013-06-13 NOTE — Transfer of Care (Signed)
Immediate Anesthesia Transfer of Care Note  Patient: Taylor Flowers  Procedure(s) Performed: Procedure(s): CERCLAGE CERVICAL (N/A)  Patient Location: PACU  Anesthesia Type:Spinal  Level of Consciousness: awake, alert  and oriented  Airway & Oxygen Therapy: Patient Spontanous Breathing  Post-op Assessment: Report given to PACU RN and Post -op Vital signs reviewed and stable  Post vital signs: Reviewed and stable  Complications: No apparent anesthesia complications

## 2013-06-13 NOTE — Anesthesia Postprocedure Evaluation (Signed)
Anesthesia Post Note  Patient: Taylor Flowers  Procedure(s) Performed: Procedure(s) (LRB): CERCLAGE CERVICAL (N/A)  Anesthesia type: Spinal  Patient location: PACU  Post pain: Pain level controlled  Post assessment: Post-op Vital signs reviewed  Last Vitals:  Filed Vitals:   06/13/13 1330  BP:   Pulse: 84  Temp:   Resp: 16    Post vital signs: Reviewed  Level of consciousness: awake  Complications: No apparent anesthesia complications

## 2013-06-13 NOTE — Progress Notes (Signed)
Late entry for 1400 Numerous attempts to find FHT unsuccessful with 2 different dopplers.  L&D RN called and at bedside.  FHT found to be 165, although very faint and only for a brief moment.  Dr Gaynell Face notified and wasn't concerned as he stated he found FHT without difficulty this am before surgery.

## 2013-06-13 NOTE — H&P (Signed)
NAME:  Taylor Flowers, Taylor Flowers                     ACCOUNT NO.:  MEDICAL RECORD NO.:  [id number]  LOCATION:                                 FACILITY:  PHYSICIAN:  Kathreen Cosier, M.D.DATE OF BIRTH:  [birth year]  DATE OF ADMISSION: DATE OF DISCHARGE:                             HISTORY & PHYSICAL   HISTORY OF PRESENT ILLNESS:  The patient is a 23 year old, gravida 3, para 0-0-2-0.  She had a spontaneous abortion at 11 weeks, and then another at 17 weeks.  She had no pain, membranes ruptured spontaneously. No bleeding and she had a spontaneous delivery of the fetus. She has incompetent cervix and she is for a cervical cerclage.  She is now [redacted] weeks pregnant.  PAST MEDICAL HISTORY:  As noted.  PAST SURGICAL HISTORY:  Negative.  SOCIAL HISTORY:  Negative.  REVIEW OF SYSTEMS:  System review negative.  PHYSICAL EXAMINATION:  GENERAL:  Well-developed female in no distress. HEENT:  Negative. LUNGS:  Clear to P and A. HEART:  Regular rhythm.  No murmurs, no gallops. BREASTS:  No masses. ABDOMEN:  Negative. GU:  Uterus 12 weeks size.  Pap smear negative.  GC, chlamydia negative. External genitalia normal.  EXTREMITIES:  Negative.   DICTATION ENDS HERE.          ______________________________ Kathreen Cosier, M.D.     BAM/MEDQ  D:  06/12/2013  T:  06/12/2013  Job:  [id number]

## 2013-06-13 NOTE — Discharge Instructions (Signed)
Discharge instructions   You can wash your hair  Shower  Eat what you want  Drink what you want  See me in 6 weeks  Your ankles are going to swell more in the next 2 weeks than when pregnant  No sex for 6 weeks   Kathreen Cosier, MD 06/13/2013

## 2013-06-13 NOTE — H&P (Signed)
  There has been no change in the history and physical since the initial dictation fetal heart rate today   160

## 2013-06-13 NOTE — Op Note (Signed)
Preop diagnosis incompetent cervix Postop diagnosis placement of cervical cerclage Anesthesia spinal Surgeon Dr. Francoise Ceo Acedia after the spinal was placed patient in lithotomy position perineum and vagina prepped and draped  Bladder emptied  with a straight catheter bimanual exam cervix was felt to be  Short so  patient placed in Trendelenburg position and draped speculum placed in the vagina and   because her vagina so tight the cervix was grasped at 12:00 with a small sponge forcep and using a #5 [place] band cervical cerclage was placed high upon the cervix and tied at 4:00 position hemostasis satisfactory patient tolerated the procedure well

## 2013-06-13 NOTE — Anesthesia Preprocedure Evaluation (Signed)
Anesthesia Evaluation  Patient identified by MRN, date of birth, ID band Patient awake    Reviewed: Allergy & Precautions, H&P , NPO status , Patient's Chart, lab work & pertinent test results, reviewed documented beta blocker date and time   History of Anesthesia Complications Negative for: history of anesthetic complications  Airway Mallampati: I TM Distance: >3 FB Neck ROM: full    Dental  (+) Teeth Intact   Pulmonary neg pulmonary ROS,  breath sounds clear to auscultation  Pulmonary exam normal       Cardiovascular negative cardio ROS  Rhythm:regular Rate:Normal     Neuro/Psych negative neurological ROS  negative psych ROS   GI/Hepatic negative GI ROS, Neg liver ROS,   Endo/Other  Morbid obesity  Renal/GU negative Renal ROS  negative genitourinary   Musculoskeletal   Abdominal   Peds  Hematology negative hematology ROS (+)   Anesthesia Other Findings   Reproductive/Obstetrics (+) Pregnancy (12 weeks, incompetent cervix)                           Anesthesia Physical Anesthesia Plan  ASA: III  Anesthesia Plan: Spinal   Post-op Pain Management:    Induction:   Airway Management Planned:   Additional Equipment:   Intra-op Plan:   Post-operative Plan:   Informed Consent: I have reviewed the patients History and Physical, chart, labs and discussed the procedure including the risks, benefits and alternatives for the proposed anesthesia with the patient or authorized representative who has indicated his/her understanding and acceptance.   Dental Advisory Given  Plan Discussed with: CRNA and Surgeon  Anesthesia Plan Comments:         Anesthesia Quick Evaluation

## 2013-06-13 NOTE — Anesthesia Procedure Notes (Signed)
Spinal  Patient location during procedure: OR Start time: 06/13/2013 12:27 PM Staffing Performed by: anesthesiologist  Preanesthetic Checklist Completed: patient identified, site marked, surgical consent, pre-op evaluation, timeout performed, IV checked, risks and benefits discussed and monitors and equipment checked Spinal Block Patient position: sitting Prep: site prepped and draped and DuraPrep Patient monitoring: heart rate, cardiac monitor, continuous pulse ox and blood pressure Approach: midline Location: L3-4 Injection technique: single-shot Needle Needle type: Pencan and Tuohy  Needle gauge: 24 G Needle length: 9 cm Assessment Sensory level: T4 Additional Notes  First attempt with Pencan was unsuccessful.  Second attempt with tuohy/pencan.  LOR to air with tuohy at 8 cm on first pass.  Pencan passed with immediate return of clear free flow CSF.  SAB dose given.  Patient tolerated procedure well with no apparent complications.  Jasmine December, MD

## 2013-06-14 ENCOUNTER — Inpatient Hospital Stay (HOSPITAL_COMMUNITY)
Admission: AD | Admit: 2013-06-14 | Discharge: 2013-06-14 | Disposition: A | Discharge status: Home / Self Care | Payer: PRIVATE HEALTH INSURANCE | Source: Ambulatory Visit | Attending: Obstetrics | Admitting: Obstetrics

## 2013-06-14 ENCOUNTER — Encounter (HOSPITAL_COMMUNITY): Payer: Self-pay | Admitting: Obstetrics

## 2013-06-14 ENCOUNTER — Ambulatory Visit: Admit: 2013-06-14 | Payer: Self-pay | Admitting: Obstetrics

## 2013-06-14 DIAGNOSIS — G971 Other reaction to spinal and lumbar puncture: Secondary | ICD-10-CM

## 2013-06-14 DIAGNOSIS — R209 Unspecified disturbances of skin sensation: Secondary | ICD-10-CM | POA: Insufficient documentation

## 2013-06-14 DIAGNOSIS — R51 Headache: Secondary | ICD-10-CM | POA: Insufficient documentation

## 2013-06-14 DIAGNOSIS — O9989 Other specified diseases and conditions complicating pregnancy, childbirth and the puerperium: Principal | ICD-10-CM

## 2013-06-14 DIAGNOSIS — O99891 Other specified diseases and conditions complicating pregnancy: Secondary | ICD-10-CM | POA: Insufficient documentation

## 2013-06-14 MED ORDER — CYCLOBENZAPRINE HCL 10 MG PO TABS: 10.0000 mg | ORAL_TABLET | Freq: Two times a day (BID) | ORAL | Status: DC | PRN

## 2013-06-14 MED ORDER — BUTALBITAL-APAP-CAFFEINE 50-325-40 MG PO TABS
1.0000 | ORAL_TABLET | Freq: Once | ORAL | Status: AC
  Administered 2013-06-14: 1 via ORAL
  Filled 2013-06-14: qty 1

## 2013-06-14 MED ORDER — BUTALBITAL-APAP-CAFFEINE 50-325-40 MG PO TABS: 1.0000 | ORAL_TABLET | Freq: Four times a day (QID) | ORAL | Status: DC | PRN

## 2013-06-14 MED ORDER — LACTATED RINGERS IV BOLUS (SEPSIS)
1000.0000 mL | Freq: Once | INTRAVENOUS | Status: AC
  Administered 2013-06-14: 1000 mL via INTRAVENOUS

## 2013-06-14 NOTE — Addendum Note (Signed)
Addendum created 06/14/13 1411 by Brayton Caves, MD   Modules edited: Clinical Notes   Clinical Notes:  Pend: [id number]; Pend: [id number]; Pend: [id number]; Pend: [id number]; Pend: [id number]; Pend: [id number]; Pend: [id number]; Beverely Low: [id number]; Pend: [phone]

## 2013-06-14 NOTE — Consult Note (Signed)
Reason for Consult: Left-sided pain and sensory changes  HPI:                                                                                                                                          Taylor Onalee HuaDavid is an 23 y.o. female [redacted] weeks pregnant who underwent cerclage under spinal anesthesia on 06/13/2013. Spinal anesthesia procedure required 2 attempts. Patient returned today with complaint of pain involving left upper and lower back as well as radiation of discomfort to the left side of her face comedonal left arm and left leg. She's also complaining of headache. Headache is slightly worse with sitting up or standing and is relieved partially with lying flat. She's also complaining of photophobia. He said no difficulty with gait or coordination. His been no loss of strength in upper and lower extremities on the left. She's also had no abdominal or bladder control abnormalities. She describes a sensation on the left side of her face is numbness as well as a sensation of spasm involving face neck and left shoulder. She has a history of migraine headaches. Current headache symptoms are not typical of her migraine headaches. She has been afebrile.  Past Medical History  Diagnosis Date  . Medical history non-contributory     Past Surgical History  Procedure Laterality Date  . Dilation and curretage    . Dilation and evacuation N/A 01/24/2013    Procedure: DILATATION AND EVACUATION;  Surgeon: Catalina AntiguaPeggy Constant, MD;  Location: [facility];  Service: Gynecology;  Laterality: N/A;  . Cervical cerclage N/A 06/13/2013    Procedure: CERCLAGE CERVICAL;  Surgeon: Kathreen CosierBernard A Marshall, MD;  Location: [facility];  Service: Gynecology;  Laterality: N/A;    Family History  Problem Relation Age of Onset  . Hypertension Mother   . Hypertension Father   . Diabetes Father   . Vision loss Father     Social History:  reports that she has never smoked. She has never used smokeless tobacco. She reports that she does not  drink alcohol or use illicit drugs.  Allergies  Allergen Reactions  . Flagyl [Metronidazole] Anaphylaxis    Throat swelling  . Darvocet [Propoxyphene N-Acetaminophen] Other (See Comments)    Pt states she starts shaking"real bad"  . Latex Rash    MEDICATIONS:  I have reviewed the patient's current medications.   ROS:                                                                                                                                       History obtained from the patient  General ROS: negative for - chills, fatigue, fever, night sweats, weight gain or weight loss Psychological ROS: negative for - behavioral disorder, hallucinations, memory difficulties, mood swings or suicidal ideation Ophthalmic ROS: negative for - blurry vision, double vision, eye pain or loss of vision ENT ROS: negative for - epistaxis, nasal discharge, oral lesions, sore throat, tinnitus or vertigo Allergy and Immunology ROS: negative for - hives or itchy/watery eyes Hematological and Lymphatic ROS: negative for - bleeding problems, bruising or swollen lymph nodes Endocrine ROS: negative for - galactorrhea, hair pattern changes, polydipsia/polyuria or temperature intolerance Respiratory ROS: negative for - cough, hemoptysis, shortness of breath or wheezing Cardiovascular ROS: negative for - chest pain, dyspnea on exertion, edema or irregular heartbeat Gastrointestinal ROS: negative for - abdominal pain, diarrhea, hematemesis, nausea/vomiting or stool incontinence Genito-Urinary ROS: negative for - dysuria, hematuria, incontinence or urinary frequency/urgency Musculoskeletal ROS: negative for - joint swelling or muscular weakness Neurological ROS: as noted in HPI Dermatological ROS: negative for rash and skin lesion changes   Blood pressure 117/81, pulse 97, temperature 98.2 F (36.8 C),  temperature source Oral, resp. rate 20, height 6' (1.829 m), weight 137.44 kg (303 lb), last menstrual period 03/17/2013.   Neurologic Examination:                                                                                                      Mental Status: Alert, oriented, mild to moderate distress complaining of headache as well as discomfort involving left side.  Speech fluent without evidence of aphasia. Able to follow commands without difficulty. Cranial Nerves: II-Visual fields were normal. III/IV/VI-Pupils were equal and reacted. Extraocular movements were full and conjugate.    V/VII-reduced perception of multiple sensory modalities, including vibration, over the left side of her face with midline splitting (also including vibration); no facial weakness. VIII-normal. X-normal speech and symmetrical palatal movement. Motor: 5/5 bilaterally with normal tone and bulk Sensory: Normal throughout, with no reduction in perception of tactile sensation over left extremities compared to right extremities. Deep Tendon Reflexes: 1+ and symmetric. Plantars: Mute bilaterally Cerebellar: Normal finger-to-nose testing. Musculoskeletal: Marked tenderness of left lumbar and thoracic paraspinal muscles as well as left trapezius muscle.  No results  found for this basename: cbc, bmp, coags, chol, tri, ldl, hga1c    Results for orders placed during the hospital encounter of 06/13/13 (from the past 48 hour(s))  CBC     Status: Abnormal   Collection Time    06/13/13 10:45 AM      Result Value Ref Range   WBC 12.3 (*) 4.0 - 10.5 K/uL   RBC 4.71  3.87 - 5.11 MIL/uL   Hemoglobin 13.7  12.0 - 15.0 g/dL   HCT 16.1  09.6 - 04.5 %   MCV 84.7  78.0 - 100.0 fL   MCH 29.1  26.0 - 34.0 pg   MCHC 34.3  30.0 - 36.0 g/dL   RDW 40.9  81.1 - 91.4 %   Platelets 280  150 - 400 K/uL    No results found.   Assessment/Plan: Etiology for patient's presenting left-sided symptoms appears to be  multifactorial, including myalgias involving left paraspinal muscles and left trapezius muscle as well as associated spasm. Exam showed no muscle weakness involving left upper extremity and left lower extremity. She also had no left facial weakness. There were no lumbar radicular symptoms nor findings on exam. Sensory symptoms involving left side of her face or in part nonphysiologic. Early manifestations of left Bell's palsy cannot be ruled out but likely.  Recommendations: 1. Symptomatic treatment of headache with analgesic medication as needed. 2. Would manage patient's symptoms noninvasively, given the vague nature of her symptomatology. 3. Heating pad for left thoracic and lumbar paraspinal muscles as needed. 4. Outpatient neurology referral if patient develops left Bell's palsy.  C.R. Roseanne Reno, MD [facility] [phone]  06/14/2013, 4:44 PM

## 2013-06-14 NOTE — MAU Note (Signed)
Patient presents with complaint of a questionable spinal headache following a spinal yesterday to place a cerclage.

## 2013-06-14 NOTE — Progress Notes (Signed)
I was called to see this patient in MAU.  She presents with new headache, L sided pain from her face to her leg (including her L arm) and photophobia.  She had a cerclage yesterday under SAB which she describes as "difficult".  She noted L sided pain on placement of the spinal.  Her spinal required two attempts.  Her symptoms are not typical of a spinal headache but there are some aspects of it that make me wonder if some component of the headache is from CSF leak.  Her history is complicated by the presence of migraines of which none in the past have presented similarly to her current headache.  She describes that the pain radiates up from the site where the spinal was placed and into her face. She denies any fever, bowel or bladder dysfunction, or any motor weakness.  She describes the pain and numbness in her face as worse when lying down, but her headache worse when upright.  I spoke to her about her current issues and the vexing nature of this constellation of symptoms .  I talked to her about epidural blood patch, conservative therapy with caffeine and IV fluid.  I also talked about ACTH but I don't feel comfortable giving this to her while she is pregnant because I do not know what potential fetal effects.  I spoke with her about caffeine and she states that caffeine makes her migraines worse but that she can take Fioricet without issues (although there is caffeine in Fioricet).  With her non-routine constellation of symptoms, I do not feel comfortable placing an epidural blood patch.  I have called neurology to consult on this case.  We will await their input.  I also spoke with her about new worrisome symptoms such as new motor weakness, new fever, loss of bowel or bladder function and she knows to come to the emergency department urgently if these occur.  We will defer further workup recommendations to neurology and treat her with conservative therapy for now (IV fluid and fioricet).

## 2013-06-14 NOTE — Discharge Instructions (Signed)
Spinal Headache, Conservative Treatment  Sometimes following a spinal tap (lumbar puncture) or an epidural, there may be CSF (cerebrospinal fluid) leakage through a hole in the dura. The dura is one of the protective membranes covering the brain and spinal cord. This leakage produces low CSF pressure. Pressure on the pain sensitive structures in the brain and relaxation (dilating) of the vessels in the head when the patient is upright is thought to cause headaches. The headache usually lasts until the hole heals and CSF pressure is restored. This can last a few days and rarely more than one week.  THERAPEUTIC ALTERNATIVES TO EPIDURAL BLOOD PATCHING INCLUDE:  · Bedrest: The symptoms of PDPH are lessened by lying down on your back. Lying on your back for a period of time (such as 24 hours) after a dural puncture has no preventative effect. It only delays the start of the PDPH.  · Hydration: Normal taking in fluids (hydration) should be maintained. Extra hydration does not alleviate the headache, but dehydration may make symptoms worse.  · Analgesics: Narcotic analgesics and, in some instances, non-steroidal anti-inflammatory agents are often given for treatment of the headache pain.  · Caffeine: Caffeine intake is a therapy to help shrink the cerebral vessels. Patients should have caffeine early in the day so that he/she can sleep at night. 500 mg of Caffeine sodium benzoate can be given in the vein (intravenously). It can be given once two hours later if the first dose does not have the desired effect. Caffeinated beverages (colas, tea, coffee) can be somewhat effective also.  · Epidural Saline Injection: Large-volume shots (boluses) or infusion of epidural normal saline can help to quickly and temporarily increase the epidural pressure. The infusions slow the speed at which CSF leaks through the dural hole. This may speed the natural healing process. Although epidural saline can be a useful technique, epidural blood  patches often have a higher success rate.  SEEK IMMEDIATE MEDICAL ATTENTION IF:   · You do not get relief from the medications given to you or your pain becomes severe.  · You have an unexplained oral temperature above 102° F (38.9° C), or as your caregiver suggests.  · You have a stiff neck.  · You lose bowel or bladder control.  · You develop severe symptoms different from your first symptoms.  · You have trouble walking.  Document Released: 06/27/2001 Document Revised: 03/30/2011 Document Reviewed: 07/28/2012  [employer]® Patient Information ©2014 [employer].

## 2013-06-14 NOTE — MAU Provider Note (Signed)
History     CSN: [id number]  Arrival date and time: 06/14/13 1229   First Provider Initiated Contact with Patient 06/14/13 1300      Chief Complaint  Patient presents with  . Headache   HPI [name] is a 23 y.o. G3P0020 at [redacted]w[redacted]d who presents to MAU today with complaint of severe headache. The patient had a spinal yesterday for cerclage placement. She states that headache started last night. She denies fever. She has had tenderness at the injection site and pain that radiates up to her upper thoracic spine. She denies a change in pain with sitting or laying down. She also endorses face pain on the left side. She has had tingling in the left arm and left leg, but denies numbness. The patient also endorses a history of migraines. She usually take Fioricet if possible. She denies any allergy to Tylenol. She last took Tylenol #3 at 0800 today without relief. She denies abdominal pain. She is having very light pink spotting with wiping only.    OB History   Grav Para Term Preterm Abortions TAB SAB Ect Mult Living   3 0   2  1 1   0      Past Medical History  Diagnosis Date  . Medical history non-contributory     Past Surgical History  Procedure Laterality Date  . Dilation and curretage    . Dilation and evacuation N/A 01/24/2013    Procedure: DILATATION AND EVACUATION;  Surgeon: Catalina Antigua, MD;  Location: [facility];  Service: Gynecology;  Laterality: N/A;  . Cervical cerclage N/A 06/13/2013    Procedure: CERCLAGE CERVICAL;  Surgeon: Kathreen Cosier, MD;  Location: [facility];  Service: Gynecology;  Laterality: N/A;    Family History  Problem Relation Age of Onset  . Hypertension Mother   . Hypertension Father   . Diabetes Father   . Vision loss Father     History  Substance Use Topics  . Smoking status: Never Smoker   . Smokeless tobacco: Never Used  . Alcohol Use: No    Allergies:  Allergies  Allergen Reactions  . Flagyl [Metronidazole] Anaphylaxis    Throat  swelling  . Darvocet [Propoxyphene N-Acetaminophen] Other (See Comments)    Pt states she starts shaking"real bad"  . Latex Rash    Prescriptions prior to admission  Medication Sig Dispense Refill  . Prenatal Vit-Fe Fumarate-FA (PRENATAL MULTIVITAMIN) TABS tablet Take 1 tablet by mouth daily at 12 noon.        Review of Systems  Constitutional: Negative for fever and malaise/fatigue.  Eyes: Negative for blurred vision.  Gastrointestinal: Negative for abdominal pain.  Genitourinary:       + spotting  Musculoskeletal: Positive for back pain.  Neurological: Positive for headaches.   Physical Exam   Blood pressure 117/81, pulse 97, temperature 98.2 F (36.8 C), temperature source Oral, resp. rate 20, height 6' (1.829 m), weight 303 lb (137.44 kg), last menstrual period 03/17/2013.  Physical Exam  Constitutional: She is oriented to person, place, and time. She appears well-developed and well-nourished. No distress.  HENT:  Head: Normocephalic.  Neck: No Brudzinski's sign noted.  Cardiovascular: Normal rate.   Respiratory: Effort normal.  GI: Soft.  Musculoskeletal:       Thoracic back: She exhibits pain. She exhibits no tenderness, no swelling and no edema.  Neurological: She is alert and oriented to person, place, and time.  Skin: Skin is warm and dry. No erythema.  Psychiatric: She has  a normal mood and affect.    MAU Course  Procedures None  MDM FHR - 168 bpm with doppler Anesthesia consulted. Recommends Fioricet and LR fluid bolus Dr. Ninetta LightsHatcher came to talk to the patient. He recommends neurology consult prior to further management.  Dr. Ninetta LightsHatcher states that he will discuss patient with Dr. Gaynell FaceMarshall as well as neurology.  Neurology consult. See provider note.  Assessment and Plan  A: Spinal headache Unilateral tingling of the left side  P: Discharge home Precuations discussed with patient by anesthesia as noted in consult note Rx for Fioricet and Flexeril given  to patient Patient advised to follow-up with Dr. Lamar LaundryMarhsall and Neurology as scheduled Patient may return to MAU as needed or if her condition were to change or worsen  Freddi StarrJulie N Ethier, PA-C  06/14/2013, 3:22 PM

## 2013-07-18 ENCOUNTER — Encounter (HOSPITAL_COMMUNITY): Payer: Self-pay | Admitting: General Practice

## 2013-07-18 ENCOUNTER — Inpatient Hospital Stay (HOSPITAL_COMMUNITY)
Admission: AD | Admit: 2013-07-18 | Discharge: 2013-07-18 | Disposition: A | Discharge status: Home / Self Care | Payer: 59 | Source: Ambulatory Visit | Attending: Obstetrics | Admitting: Obstetrics

## 2013-07-18 DIAGNOSIS — R109 Unspecified abdominal pain: Secondary | ICD-10-CM | POA: Insufficient documentation

## 2013-07-18 DIAGNOSIS — O26892 Other specified pregnancy related conditions, second trimester: Secondary | ICD-10-CM

## 2013-07-18 DIAGNOSIS — R51 Headache: Secondary | ICD-10-CM | POA: Diagnosis not present

## 2013-07-18 DIAGNOSIS — O3430 Maternal care for cervical incompetence, unspecified trimester: Secondary | ICD-10-CM | POA: Diagnosis not present

## 2013-07-18 DIAGNOSIS — O99891 Other specified diseases and conditions complicating pregnancy: Secondary | ICD-10-CM | POA: Diagnosis not present

## 2013-07-18 DIAGNOSIS — M545 Low back pain, unspecified: Secondary | ICD-10-CM | POA: Diagnosis not present

## 2013-07-18 DIAGNOSIS — O26899 Other specified pregnancy related conditions, unspecified trimester: Secondary | ICD-10-CM

## 2013-07-18 DIAGNOSIS — O9989 Other specified diseases and conditions complicating pregnancy, childbirth and the puerperium: Principal | ICD-10-CM

## 2013-07-18 LAB — URINALYSIS, ROUTINE W REFLEX MICROSCOPIC
Bilirubin Urine: NEGATIVE
GLUCOSE, UA: NEGATIVE mg/dL
HGB URINE DIPSTICK: NEGATIVE
Ketones, ur: 15 mg/dL — AB
Leukocytes, UA: NEGATIVE
Nitrite: NEGATIVE
PH: 6 (ref 5.0–8.0)
Protein, ur: NEGATIVE mg/dL
Specific Gravity, Urine: 1.02 (ref 1.005–1.030)
Urobilinogen, UA: 0.2 mg/dL (ref 0.0–1.0)

## 2013-07-18 MED ORDER — BUTALBITAL-APAP-CAFFEINE 50-325-40 MG PO TABS
1.0000 | ORAL_TABLET | Freq: Once | ORAL | Status: AC
  Administered 2013-07-18: 1 via ORAL
  Filled 2013-07-18: qty 1

## 2013-07-18 MED ORDER — BUTALBITAL-APAP-CAFFEINE 50-325-40 MG PO TABS: 2.0000 | ORAL_TABLET | Freq: Once | ORAL | Status: DC

## 2013-07-18 NOTE — MAU Note (Signed)
Pt reports lower abd pain and lower back pain for the last 3 days, taking flexeril and tylenol without relief. Cerclage placed last month. Denies bleeding.

## 2013-07-18 NOTE — MAU Provider Note (Signed)
History     CSN: [id number]  Arrival date and time: 07/18/13 [id number]   First Provider Initiated Contact with Patient 07/18/13 2010      Chief Complaint  Patient presents with  . Abdominal Pain  . Back Pain   HPI  Pt is 1615w4d pregnant  With hx of prophylactic cerclage 5/26/205 ; poor OB hx with pregnancy loss at 17 weeks  of and presents with lower abd pain and lower back pain. Pt also has a headache that started later today. Pt took Tylenol at 3 pm with relief and then nap- then woke up afterwards with her head pounding.  Pt denies leakage of fluid or bleeding or UTI sx. Pt denies ctx. Pt feels some pressure in her vagina. Pt states her headache comes more when she is under stress and significant other's mother is dying with CHF. Pt says she has a prescription for Fioricet at pharmacy and has not picked up Pt is at home on bedrest and has plenty of help at home.  Past Medical History  Diagnosis Date  . Medical history non-contributory     Past Surgical History  Procedure Laterality Date  . Dilation and curretage    . Dilation and evacuation N/A 01/24/2013    Procedure: DILATATION AND EVACUATION;  Surgeon: Catalina AntiguaPeggy Constant, MD;  Location: [facility];  Service: Gynecology;  Laterality: N/A;  . Cervical cerclage N/A 06/13/2013    Procedure: CERCLAGE CERVICAL;  Surgeon: Kathreen CosierBernard A Marshall, MD;  Location: [facility];  Service: Gynecology;  Laterality: N/A;    Family History  Problem Relation Age of Onset  . Hypertension Mother   . Hypertension Father   . Diabetes Father   . Vision loss Father     History  Substance Use Topics  . Smoking status: Never Smoker   . Smokeless tobacco: Never Used  . Alcohol Use: No    Allergies:  Allergies  Allergen Reactions  . Flagyl [Metronidazole] Anaphylaxis    Throat swelling  . Darvocet [Propoxyphene N-Acetaminophen] Other (See Comments)    Pt states she starts shaking"real bad"  . Latex Rash    Prescriptions prior to admission  Medication  Sig Dispense Refill  . acetaminophen (TYLENOL) 325 MG tablet Take 650 mg by mouth every 6 (six) hours as needed for headache.      . butalbital-acetaminophen-caffeine (FIORICET) 50-325-40 MG per tablet Take 1 tablet by mouth every 6 (six) hours as needed for headache.  20 tablet  0  . calcium carbonate (TUMS - DOSED IN MG ELEMENTAL CALCIUM) 500 MG chewable tablet Chew 1 tablet by mouth daily as needed for indigestion or heartburn.      . cyclobenzaprine (FLEXERIL) 10 MG tablet Take 1 tablet (10 mg total) by mouth 2 (two) times daily as needed for muscle spasms.  20 tablet  0  . Prenatal Vit-Fe Fumarate-FA (PRENATAL MULTIVITAMIN) TABS tablet Take 1 tablet by mouth daily at 12 noon.        Review of Systems  Constitutional: Negative for fever and chills.  HENT: Negative for hearing loss.   Gastrointestinal: Positive for abdominal pain. Negative for nausea, vomiting, diarrhea and constipation.  Genitourinary: Negative for dysuria and urgency.  Neurological: Positive for dizziness and headaches.  Psychiatric/Behavioral: The patient is nervous/anxious.    Physical Exam   Blood pressure 134/86, pulse 104, temperature 98.5 F (36.9 C), temperature source Oral, resp. rate 20, height 6' (1.829 m), weight 303 lb 8 oz (137.667 kg), last menstrual period 03/17/2013, SpO2 100.00%.  Physical Exam  Nursing note and vitals reviewed. Constitutional: She is oriented to person, place, and time. She appears well-developed and well-nourished.  HENT:  Head: Normocephalic.  Eyes: Pupils are equal, round, and reactive to light.  Neck: Normal range of motion. Neck supple.  Cardiovascular: Normal rate.   Respiratory: Effort normal.  GI: Soft.  Genitourinary:  Spec exam- cervix closed; vagina clean  Musculoskeletal: Normal range of motion.  Neurological: She is alert and oriented to person, place, and time.  Skin: Skin is warm and dry.  Psychiatric: She has a normal mood and affect.    MAU Course   Procedures No ctx palpated Dr. Gaynell FaceMarshall contacted Fioricet 1 tablet given   Assessment and Plan  abd pain in pregnancy Cerclage intact Continue bed rest Headache- increase in fluids- fioricet F/u with Dr. Ellin GoodieMArshall  Story Vanvranken 07/18/2013, 8:12 PM

## 2013-07-18 NOTE — Discharge Instructions (Signed)

## 2013-07-25 ENCOUNTER — Other Ambulatory Visit (HOSPITAL_COMMUNITY): Payer: Self-pay | Admitting: Obstetrics

## 2013-07-25 DIAGNOSIS — O3432 Maternal care for cervical incompetence, second trimester: Secondary | ICD-10-CM

## 2013-07-25 DIAGNOSIS — Z3689 Encounter for other specified antenatal screening: Secondary | ICD-10-CM

## 2013-08-02 ENCOUNTER — Other Ambulatory Visit (HOSPITAL_COMMUNITY): Payer: 59

## 2013-08-08 ENCOUNTER — Encounter (HOSPITAL_COMMUNITY): Payer: Self-pay

## 2013-08-08 ENCOUNTER — Ambulatory Visit (HOSPITAL_COMMUNITY)
Admission: RE | Admit: 2013-08-08 | Discharge: 2013-08-08 | Disposition: A | Discharge status: Home / Self Care | Payer: 59 | Source: Ambulatory Visit | Attending: Obstetrics | Admitting: Obstetrics

## 2013-08-08 ENCOUNTER — Other Ambulatory Visit (HOSPITAL_COMMUNITY): Payer: Self-pay | Admitting: Obstetrics

## 2013-08-08 DIAGNOSIS — O09299 Supervision of pregnancy with other poor reproductive or obstetric history, unspecified trimester: Secondary | ICD-10-CM

## 2013-08-08 DIAGNOSIS — O3430 Maternal care for cervical incompetence, unspecified trimester: Secondary | ICD-10-CM | POA: Insufficient documentation

## 2013-08-08 DIAGNOSIS — O3432 Maternal care for cervical incompetence, second trimester: Secondary | ICD-10-CM

## 2013-08-08 DIAGNOSIS — Z3689 Encounter for other specified antenatal screening: Secondary | ICD-10-CM | POA: Insufficient documentation

## 2013-08-13 ENCOUNTER — Encounter (HOSPITAL_COMMUNITY): Payer: Self-pay | Admitting: *Deleted

## 2013-08-13 ENCOUNTER — Inpatient Hospital Stay (HOSPITAL_COMMUNITY)
Admission: AD | Admit: 2013-08-13 | Discharge: 2013-08-24 | DRG: 774 | Disposition: A | Discharge status: Home / Self Care | Payer: 59 | Source: Ambulatory Visit | Attending: Obstetrics | Admitting: Obstetrics

## 2013-08-13 ENCOUNTER — Inpatient Hospital Stay (HOSPITAL_COMMUNITY): Payer: 59

## 2013-08-13 DIAGNOSIS — O99214 Obesity complicating childbirth: Secondary | ICD-10-CM

## 2013-08-13 DIAGNOSIS — O26872 Cervical shortening, second trimester: Secondary | ICD-10-CM | POA: Diagnosis present

## 2013-08-13 DIAGNOSIS — O9902 Anemia complicating childbirth: Secondary | ICD-10-CM | POA: Diagnosis present

## 2013-08-13 DIAGNOSIS — O034 Incomplete spontaneous abortion without complication: Secondary | ICD-10-CM | POA: Diagnosis not present

## 2013-08-13 DIAGNOSIS — O039 Complete or unspecified spontaneous abortion without complication: Secondary | ICD-10-CM | POA: Diagnosis present

## 2013-08-13 DIAGNOSIS — Z6841 Body Mass Index (BMI) 40.0 and over, adult: Secondary | ICD-10-CM

## 2013-08-13 DIAGNOSIS — O4290 Premature rupture of membranes, unspecified as to length of time between rupture and onset of labor, unspecified weeks of gestation: Principal | ICD-10-CM | POA: Diagnosis present

## 2013-08-13 DIAGNOSIS — O3432 Maternal care for cervical incompetence, second trimester: Secondary | ICD-10-CM

## 2013-08-13 DIAGNOSIS — D649 Anemia, unspecified: Secondary | ICD-10-CM | POA: Diagnosis present

## 2013-08-13 DIAGNOSIS — E669 Obesity, unspecified: Secondary | ICD-10-CM | POA: Diagnosis present

## 2013-08-13 DIAGNOSIS — N883 Incompetence of cervix uteri: Secondary | ICD-10-CM | POA: Diagnosis present

## 2013-08-13 DIAGNOSIS — O328XX0 Maternal care for other malpresentation of fetus, not applicable or unspecified: Secondary | ICD-10-CM | POA: Diagnosis present

## 2013-08-13 DIAGNOSIS — F411 Generalized anxiety disorder: Secondary | ICD-10-CM | POA: Diagnosis present

## 2013-08-13 DIAGNOSIS — O3430 Maternal care for cervical incompetence, unspecified trimester: Secondary | ICD-10-CM | POA: Diagnosis present

## 2013-08-13 DIAGNOSIS — O99344 Other mental disorders complicating childbirth: Secondary | ICD-10-CM | POA: Diagnosis present

## 2013-08-13 HISTORY — DX: Other reaction to spinal and lumbar puncture: G97.1

## 2013-08-13 HISTORY — DX: Cervical shortening, second trimester: O34.32

## 2013-08-13 HISTORY — DX: Cervical shortening, second trimester: O26.872

## 2013-08-13 LAB — WET PREP, GENITAL
Trich, Wet Prep: NONE SEEN
YEAST WET PREP: NONE SEEN

## 2013-08-13 LAB — URINALYSIS, ROUTINE W REFLEX MICROSCOPIC
Bilirubin Urine: NEGATIVE
GLUCOSE, UA: NEGATIVE mg/dL
HGB URINE DIPSTICK: NEGATIVE
Ketones, ur: NEGATIVE mg/dL
Nitrite: NEGATIVE
PH: 6.5 (ref 5.0–8.0)
Protein, ur: NEGATIVE mg/dL
SPECIFIC GRAVITY, URINE: 1.01 (ref 1.005–1.030)
UROBILINOGEN UA: 0.2 mg/dL (ref 0.0–1.0)

## 2013-08-13 LAB — URINE MICROSCOPIC-ADD ON

## 2013-08-13 MED ORDER — DOCUSATE SODIUM 100 MG PO CAPS
100.0000 mg | ORAL_CAPSULE | Freq: Every day | ORAL | Status: DC
  Administered 2013-08-14 – 2013-08-17 (×2): 100 mg via ORAL
  Filled 2013-08-13 (×4): qty 1

## 2013-08-13 MED ORDER — SODIUM CHLORIDE 0.9 % IJ SOLN: 3.0000 mL | INTRAMUSCULAR | Status: DC | PRN

## 2013-08-13 MED ORDER — ZOLPIDEM TARTRATE 5 MG PO TABS
5.0000 mg | ORAL_TABLET | Freq: Every evening | ORAL | Status: DC | PRN
  Administered 2013-08-15 – 2013-08-22 (×8): 5 mg via ORAL
  Filled 2013-08-13 (×9): qty 1

## 2013-08-13 MED ORDER — PRENATAL MULTIVITAMIN CH
1.0000 | ORAL_TABLET | Freq: Every day | ORAL | Status: DC
  Administered 2013-08-14 – 2013-08-22 (×9): 1 via ORAL
  Filled 2013-08-13 (×10): qty 1

## 2013-08-13 MED ORDER — ACETAMINOPHEN 325 MG PO TABS
650.0000 mg | ORAL_TABLET | ORAL | Status: DC | PRN
  Administered 2013-08-14 – 2013-08-22 (×12): 650 mg via ORAL
  Filled 2013-08-13 (×13): qty 2

## 2013-08-13 MED ORDER — NIFEDIPINE 10 MG PO CAPS
10.0000 mg | ORAL_CAPSULE | Freq: Four times a day (QID) | ORAL | Status: DC
  Administered 2013-08-14 – 2013-08-20 (×26): 10 mg via ORAL
  Filled 2013-08-13 (×27): qty 1

## 2013-08-13 MED ORDER — SODIUM CHLORIDE 0.9 % IV SOLN: 250.0000 mL | INTRAVENOUS | Status: DC | PRN

## 2013-08-13 MED ORDER — NIFEDIPINE 10 MG PO CAPS
20.0000 mg | ORAL_CAPSULE | Freq: Once | ORAL | Status: AC
  Administered 2013-08-13: 20 mg via ORAL
  Filled 2013-08-13: qty 2

## 2013-08-13 MED ORDER — CALCIUM CARBONATE ANTACID 500 MG PO CHEW
2.0000 | CHEWABLE_TABLET | ORAL | Status: DC | PRN
  Filled 2013-08-13: qty 2

## 2013-08-13 MED ORDER — SODIUM CHLORIDE 0.9 % IJ SOLN
3.0000 mL | Freq: Two times a day (BID) | INTRAMUSCULAR | Status: DC
  Administered 2013-08-14 – 2013-08-22 (×13): 3 mL via INTRAVENOUS

## 2013-08-13 NOTE — MAU Note (Addendum)
Patient reports vaginal pressure and pulling that started around 6 pm. Feeling tightness and cramping unsure if she is contracting. Denies vaginal bleeding and LOF. Cerclage placed 5/26

## 2013-08-13 NOTE — MAU Provider Note (Addendum)
Chief Complaint:  No chief complaint on file.   First Provider Initiated Contact with Patient 08/13/13 1948      HPI: Taylor Flowers is a 23 y.o. G3P0020 at 3639w2d who presents to maternity admissions reporting onset at 0600 of vaginal pressure and pulling sensation. She describes a "bubbling" sensation in her vagina. She is noted malodor but denies vaginal irritation or pruritis. Today has had intermittent, nonpainful lower abdominal tightening. Follows modified BR and is on vaginal progesterone.  Denies leakage of fluid or vaginal bleeding. Good fetal movement.   Pregnancy Course: Prophylactic cerclage placed 06/13/2013; CL 0.8 cm at 20 wks  Past Medical History: Past Medical History  Diagnosis Date  . Medical history non-contributory   . Incompetence of cervix     Past obstetric history: OB History  Gravida Para Term Preterm AB SAB TAB Ectopic Multiple Living  3 0   2 1  1   0    # Outcome Date GA Lbr Len/2nd Weight Sex Delivery Anes PTL Lv  3 CUR           2 SAB 01/24/13 6047w3d  0.119 kg (4.2 oz) M  None       Comments: incompetent cervix  1 ECT 06/25/07 5238w0d            Comments: surgery      Past Surgical History: Past Surgical History  Procedure Laterality Date  . Dilation and curretage    . Dilation and evacuation N/A 01/24/2013    Procedure: DILATATION AND EVACUATION;  Surgeon: Catalina AntiguaPeggy Constant, MD;  Location: [facility];  Service: Gynecology;  Laterality: N/A;  . Cervical cerclage N/A 06/13/2013    Procedure: CERCLAGE CERVICAL;  Surgeon: Kathreen CosierBernard A Marshall, MD;  Location: [facility];  Service: Gynecology;  Laterality: N/A;     Family History: Family History  Problem Relation Age of Onset  . Hypertension Mother   . Hypertension Father   . Diabetes Father   . Vision loss Father     Social History: History  Substance Use Topics  . Smoking status: Never Smoker   . Smokeless tobacco: Never Used  . Alcohol Use: No    Allergies:  Allergies  Allergen Reactions  . Flagyl  [Metronidazole] Anaphylaxis    Throat swelling  . Darvocet [Propoxyphene N-Acetaminophen] Other (See Comments)    Pt states she starts shaking"real bad"  . Latex Rash    Meds:  Prescriptions prior to admission  Medication Sig Dispense Refill  . acetaminophen (TYLENOL) 325 MG tablet Take 650 mg by mouth every 6 (six) hours as needed for headache.      . butalbital-acetaminophen-caffeine (FIORICET) 50-325-40 MG per tablet Take 1 tablet by mouth every 6 (six) hours as needed for headache.  20 tablet  0  . calcium carbonate (TUMS - DOSED IN MG ELEMENTAL CALCIUM) 500 MG chewable tablet Chew 1 tablet by mouth daily as needed for indigestion or heartburn.      . cyclobenzaprine (FLEXERIL) 10 MG tablet Take 1 tablet (10 mg total) by mouth 2 (two) times daily as needed for muscle spasms.  20 tablet  0  . Prenatal Vit-Fe Fumarate-FA (PRENATAL MULTIVITAMIN) TABS tablet Take 1 tablet by mouth daily at 12 noon.        ROS: Pertinent findings in history of present illness.  Physical Exam  Blood pressure 119/76, pulse 109, temperature 97.9 F (36.6 C), temperature source Oral, resp. rate 18, last menstrual period 03/17/2013. GENERAL: Well-developed, well-nourished female in no acute distress.  HEENT: normocephalic HEART: normal rate RESP: normal effort ABDOMEN: Soft, non-tender, gravid appropriate for gestational age; DT 25 EXTREMITIES: Nontender, no edema NEURO: alert and oriented SPECULUM EXAM: NEFG, copious thin gray discharge, cx obscured  VE: cerclage in place; cx 1/short/ PP high; membranes flush with ext os  Labs: Results for orders placed during the hospital encounter of 08/13/13 (from the past 24 hour(s))  URINALYSIS, ROUTINE W REFLEX MICROSCOPIC     Status: Abnormal   Collection Time    08/13/13  7:30 PM      Result Value Ref Range   Color, Urine YELLOW  YELLOW   APPearance CLEAR  CLEAR   Specific Gravity, Urine 1.010  1.005 - 1.030   pH 6.5  5.0 - 8.0   Glucose, UA NEGATIVE   NEGATIVE mg/dL   Hgb urine dipstick NEGATIVE  NEGATIVE   Bilirubin Urine NEGATIVE  NEGATIVE   Ketones, ur NEGATIVE  NEGATIVE mg/dL   Protein, ur NEGATIVE  NEGATIVE mg/dL   Urobilinogen, UA 0.2  0.0 - 1.0 mg/dL   Nitrite NEGATIVE  NEGATIVE   Leukocytes, UA SMALL (*) NEGATIVE  URINE MICROSCOPIC-ADD ON     Status: Abnormal   Collection Time    08/13/13  7:30 PM      Result Value Ref Range   Squamous Epithelial / LPF RARE  RARE   WBC, UA 3-6  <3 WBC/hpf   RBC / HPF 0-2  <3 RBC/hpf   Bacteria, UA FEW (*) RARE  WET PREP, GENITAL     Status: Abnormal   Collection Time    08/13/13  8:08 PM      Result Value Ref Range   Yeast Wet Prep HPF POC NONE SEEN  NONE SEEN   Trich, Wet Prep NONE SEEN  NONE SEEN   Clue Cells Wet Prep HPF POC FEW (*) NONE SEEN   WBC, Wet Prep HPF POC MANY (*) NONE SEEN       MAU Course: Care assumed by Sharen Counter CNM at 2100  Danae Orleans, CNM 08/13/2013 7:52 PM  Imaging:  Preliminary report with no measurable cervix and hourglass of membranes into vagina  Assessment: 1. Cervical cerclage suture present in second trimester   2. Short cervix in second trimester, antepartum     Plan: Consult Dr Tamela Oddi Admit to antepartum Saline lock Bed rest with bedside commode Procardia Q 6 hours Treat for malodorous discharge for suspected BV. Pt has anaphylaxis with Flagyl and already using vaginal suppositories for shortening cervix.  Clindamycin 300 mg PO BID x 7 days as alternative regimen. Reevaluate cervix/membranes tomorrow  Sharen Counter Certified Nurse-Midwife

## 2013-08-14 ENCOUNTER — Inpatient Hospital Stay (HOSPITAL_COMMUNITY): Payer: 59

## 2013-08-14 DIAGNOSIS — O99214 Obesity complicating childbirth: Secondary | ICD-10-CM | POA: Diagnosis present

## 2013-08-14 DIAGNOSIS — N883 Incompetence of cervix uteri: Secondary | ICD-10-CM

## 2013-08-14 DIAGNOSIS — F411 Generalized anxiety disorder: Secondary | ICD-10-CM | POA: Diagnosis present

## 2013-08-14 DIAGNOSIS — O9902 Anemia complicating childbirth: Secondary | ICD-10-CM | POA: Diagnosis present

## 2013-08-14 DIAGNOSIS — O034 Incomplete spontaneous abortion without complication: Secondary | ICD-10-CM | POA: Diagnosis present

## 2013-08-14 DIAGNOSIS — Z6841 Body Mass Index (BMI) 40.0 and over, adult: Secondary | ICD-10-CM | POA: Diagnosis not present

## 2013-08-14 DIAGNOSIS — O99344 Other mental disorders complicating childbirth: Secondary | ICD-10-CM | POA: Diagnosis present

## 2013-08-14 DIAGNOSIS — O3430 Maternal care for cervical incompetence, unspecified trimester: Secondary | ICD-10-CM | POA: Diagnosis present

## 2013-08-14 DIAGNOSIS — O4290 Premature rupture of membranes, unspecified as to length of time between rupture and onset of labor, unspecified weeks of gestation: Secondary | ICD-10-CM | POA: Diagnosis present

## 2013-08-14 DIAGNOSIS — O328XX0 Maternal care for other malpresentation of fetus, not applicable or unspecified: Secondary | ICD-10-CM | POA: Diagnosis present

## 2013-08-14 DIAGNOSIS — D649 Anemia, unspecified: Secondary | ICD-10-CM | POA: Diagnosis present

## 2013-08-14 HISTORY — DX: Incompetence of cervix uteri: N88.3

## 2013-08-14 LAB — AMNISURE RUPTURE OF MEMBRANE (ROM) NOT AT ARMC: Amnisure ROM: NEGATIVE

## 2013-08-14 MED ORDER — MAGNESIUM HYDROXIDE 400 MG/5ML PO SUSP
30.0000 mL | Freq: Two times a day (BID) | ORAL | Status: DC
  Administered 2013-08-14 – 2013-08-17 (×7): 30 mL via ORAL
  Filled 2013-08-14 (×8): qty 30

## 2013-08-14 MED ORDER — LORATADINE 10 MG PO TABS
10.0000 mg | ORAL_TABLET | Freq: Every day | ORAL | Status: DC
  Administered 2013-08-14 – 2013-08-23 (×10): 10 mg via ORAL
  Filled 2013-08-14 (×12): qty 1

## 2013-08-14 MED ORDER — CLINDAMYCIN HCL 300 MG PO CAPS
300.0000 mg | ORAL_CAPSULE | Freq: Two times a day (BID) | ORAL | Status: DC
  Administered 2013-08-14 – 2013-08-17 (×9): 300 mg via ORAL
  Filled 2013-08-14 (×10): qty 1

## 2013-08-14 MED ORDER — PROGESTERONE MICRONIZED 200 MG PO CAPS
200.0000 mg | ORAL_CAPSULE | Freq: Every day | ORAL | Status: DC
  Administered 2013-08-14 – 2013-08-17 (×4): 200 mg via VAGINAL
  Filled 2013-08-14 (×4): qty 1

## 2013-08-14 NOTE — Consult Note (Signed)
Neonatology Consult Note:  At the request of the patients obstetrician Dr. Ruthann Cancer I met with Wyonia Hough who is at 21 3 weeks currently with pregnancy complicated by incompetent cervix.  H/o fetal loss at 17 weeks with cerclage placed this pregnancy at 20 weeks.   She had questions regarding the GA of viability as well as what to expect should her pregnancy progress past 23-24 weeks.  We discussed the possibility of resuscitation at 23-24 weeks and discussed morbidity/mortality at these gestional ages.  She was eager for [place] to resuscitate the baby once she reaches a viable gestation.  We also discussed delivery room resuscitation, including intubation and surfactant in DR.  Discussed mechanical ventilation and risk for chronic lung disease, risk for IVH with potential for motor / cognitive deficits, ROP, NEC, sepsis, as well as temperature instability and feeding immaturity.  Discussed NG / OG feeds, benefits of MBM in reducing incidence of NEC.   Discussed likely length of stay.   Thank you for allowing [place] to participate in her care.   Higinio Roger, DO  Neonatologist  The total length of face-to-face or floor / unit time for this encounter was 20 minutes.  Counseling and / or coordination of care was greater than fifty percent of the time.

## 2013-08-14 NOTE — Progress Notes (Signed)
Ur chart review completed.  

## 2013-08-14 NOTE — Progress Notes (Signed)
RN called to room with pt c/o leaking fluid, moderate amt of fluid on pad, questionable ROM.  Dr. Gaynell FaceMarshall notfied and orders for u/s and amnisure received.

## 2013-08-14 NOTE — Progress Notes (Signed)
Patient ID: Taylor Flowers, female   DOB: 01/27/1990, 23 y.o.   MRN: [id number] Patient came in last time because she had some cramping an ultrasound showed 21 weeks breech showed a cerclage an hourglass membranes she's not contracting she is in Trendelenburg she is on clindamycin and bed in Trendelenburg Foley will be inserted and to use the bedpan and continue present therapy nicu consult obtained today and MFM to seek followupvisit

## 2013-08-14 NOTE — Progress Notes (Signed)
I spent time with Taylor Flowers who is in good spirits today and is trying to stay hopeful and positive.  She had a loss here at 17 weeks, her son, Taylor Flowers, was named after his father.  She sees all of this as an act of love and hope for her baby.  She has good support from FOB and from her [place] ([place]  She reports that she does have some issues with anxiety and panic attacks, but she can usually tell when they are coming on and she has coping techniques like writing poetry or listening to music.  She has her music and I am going to provide her with some paper for writing poetry.  She also knows that we are available and knows how to request chaplain support from her RN.  Please page as needs arise or as pt requests.  [address]Chaplain [place] Pager, [phone] 12:53 PM   08/14/13 1200  Clinical Encounter Type  Visited With Patient  Visit Type Spiritual support  Referral From Nurse  Spiritual Encounters  Spiritual Needs Emotional

## 2013-08-15 ENCOUNTER — Ambulatory Visit (HOSPITAL_COMMUNITY): Admission: RE | Admit: 2013-08-15 | Payer: PRIVATE HEALTH INSURANCE | Source: Ambulatory Visit

## 2013-08-15 LAB — CBC
HCT: 40.5 % (ref 36.0–46.0)
HEMOGLOBIN: 14 g/dL (ref 12.0–15.0)
MCH: 30.4 pg (ref 26.0–34.0)
MCHC: 34.6 g/dL (ref 30.0–36.0)
MCV: 87.9 fL (ref 78.0–100.0)
Platelets: 270 10*3/uL (ref 150–400)
RBC: 4.61 MIL/uL (ref 3.87–5.11)
RDW: 13.9 % (ref 11.5–15.5)
WBC: 16 10*3/uL — ABNORMAL HIGH (ref 4.0–10.5)

## 2013-08-15 LAB — TYPE AND SCREEN
ABO/RH(D): O POS
ANTIBODY SCREEN: NEGATIVE

## 2013-08-15 NOTE — Progress Notes (Signed)
08/15/13 1600  Clinical Encounter Type  Visited With Patient  Visit Type Spiritual support;Social support  Referral From Chaplain ([address] Re[address]sen, [place], [place])  Spiritual Encounters  Spiritual Needs Emotional   Familiar with [place] from her loss in January.  She was very welcoming and in great spirits.  Provided pastoral presence, reflective listening, witness to her story and healing, encouragement, and affirmation of her own spiritual gift of encouragement.  Pt aware of ongoing chaplain availability.  [place] will continue to follow, but please also page as needs arise.  Thank you.  [address] [place], [place] [phone]

## 2013-08-15 NOTE — Progress Notes (Signed)
Patient ID: Taylor Flowers, female   DOB: 11/27/1990, 23 y.o.   MRN: [id number] Vital signs normal Yesterday patient had some leakage of fluid from the vagina after the Foley was inserted and the Essure was negative and ultrasound today was no change in she's not doing any leakage today and she's not having contractions she was seen by MICU where her present situation was discussed a

## 2013-08-15 NOTE — H&P (Signed)
This is Dr. Francoise Taylor Flowers dictating the history and physical on  Taylor Flowers  she's a 23 year old gravida 3 para 0020 at 21 weeks and 4 days EDC 12  4  the patient had spontaneous abortion at 8 weeks and then had   a spontaneous abortion at 17 weeks she is known to have the incompetent cervix and with this pregnancy at 12-14 weeks a cerclage was placed her cervix was noted to be millimeters  8  MFM last week an ultrasound and on Sunday night patient came and spoke on penicillin cramping that time cervix was still short and the membranes  hourglassing  Placed in  Trendelenburg started on clindamycin 300 by mouth every 6 hours she was not contracting and she had a bedside commode and a Foley catheter Past medical history noted the incompetent cervix Past surgical history cerclage basis pregnant pregnancy Social history negative System review negative Physical exam well-developed female in no distress HEENT negative Lungs clear to P&A Heart regular rhythm no murmurs no gallops gallops Breasts negative Abdomen 20 week size uterus Pelvic deferred Extremities negative

## 2013-08-16 LAB — AMNISURE RUPTURE OF MEMBRANE (ROM) NOT AT ARMC: Amnisure ROM: POSITIVE

## 2013-08-16 MED ORDER — LACTATED RINGERS IV SOLN
INTRAVENOUS | Status: DC
  Administered 2013-08-16 – 2013-08-18 (×6): via INTRAVENOUS

## 2013-08-16 NOTE — Progress Notes (Signed)
Patient ID: Taylor Flowers, female   DOB: 09/22/1990, 23 y.o.   MRN: [id number] Oh 4 PM today no supportive the patient was leaking a small amount of fluid that was a no-show positive and and patient started having some crampy a speculum exam was performed and and the membranes were bulging when the patient pushed there was no leakage of fluid noted her cramping is stopped the cervix was 2 cm 90% and a cerclage is still in it was decided that the cerclage would not be removed at this time as it appeared that her membranes were still intact if patient starts having cramping then would remove that cerclage

## 2013-08-16 NOTE — Progress Notes (Signed)
Patient notified that Amnisure results are positive for rupture and that Dr. Gaynell FaceMarshall has been notified and there are no changes in the plan of care. Pt denies questions at present.

## 2013-08-16 NOTE — Progress Notes (Signed)
Patient ID: Taylor Flowers, female   DOB: 10/02/1990, 23 y.o.   MRN: [id number] Vital signs normal No change in her condition No leakage of fluid no cramping continue bedrest and

## 2013-08-16 NOTE — Progress Notes (Signed)
Fern done and negative. Read by Luan MooreM. Causey, RN and Beverley FiedlerLiz Cone, RN. Dr. Gaynell FaceMarshall informed. No new changes in plan of care

## 2013-08-16 NOTE — Progress Notes (Signed)
Patient currently eating breakfast and states she will take Procardia now. Encouraged patient to remain on schedule with Procardia. Patient states that it gives her a HA when she takes it without eating. Explained to patient that HA is a normal side effect of Procardia and that taking tylenol may help. Patient agrees to Tylenol. Will message pharmacy to change Procardia admin times.

## 2013-08-16 NOTE — Progress Notes (Signed)
Pt c/o dampness on perineum. States she has noticed it for the last couple of hours.

## 2013-08-17 NOTE — Progress Notes (Signed)
08/17/13 1500  Clinical Encounter Type  Visited With Patient and family together (SO)  Visit Type Spiritual support;Social support  Spiritual Encounters  Spiritual Needs Emotional   Followed up with [place] and SO to offer further support.  After a stressful night (suspected PROM), she is very relieved and grateful today.  Prayer continues to help her cope well with stress and worry.  She and SO were in good spirits.  In particular, she is looking forward to having special visitors in two weeks during her family reunion.  Provided pastoral presence, empathic listening, and affirmation.  [place] following, but please also page as needs arise.  Thank you.  [address] Philipp H[place], [place] [phone]

## 2013-08-17 NOTE — Progress Notes (Signed)
Patient ID: Taylor CoopRaquel M Flowers, female   DOB: 07/23/1990, 23 y.o.   MRN: [id number] Vital signs normal No cramping no leakage last night The sternum which was done that time and a pelvic exam last night was negative for ruptured membranes Status unchanged continue present therapy

## 2013-08-17 NOTE — Progress Notes (Signed)
UR completed 

## 2013-08-18 LAB — TYPE AND SCREEN
ABO/RH(D): O POS
Antibody Screen: NEGATIVE

## 2013-08-18 MED ORDER — DOCUSATE SODIUM 100 MG PO CAPS
100.0000 mg | ORAL_CAPSULE | Freq: Two times a day (BID) | ORAL | Status: DC
  Administered 2013-08-18 – 2013-08-22 (×7): 100 mg via ORAL
  Filled 2013-08-18 (×8): qty 1

## 2013-08-18 NOTE — Progress Notes (Signed)
Patient ID: Taylor Flowers, female   DOB: 01/15/1991, 23 y.o.   MRN: [id number] Remains afebrile No contractions Is a report of minima leakage she had a pelvic 2 nights ago which showed that her membranes were still intact the been no change in her management and and

## 2013-08-19 LAB — CBC
HCT: 38.7 % (ref 36.0–46.0)
Hemoglobin: 13.6 g/dL (ref 12.0–15.0)
MCH: 30.7 pg (ref 26.0–34.0)
MCHC: 35.1 g/dL (ref 30.0–36.0)
MCV: 87.4 fL (ref 78.0–100.0)
PLATELETS: 258 10*3/uL (ref 150–400)
RBC: 4.43 MIL/uL (ref 3.87–5.11)
RDW: 13.5 % (ref 11.5–15.5)
WBC: 15.6 10*3/uL — ABNORMAL HIGH (ref 4.0–10.5)

## 2013-08-19 NOTE — Progress Notes (Signed)
Patient ID: Taylor Flowers, female   DOB: 06/06/1990, 23 y.o.   MRN: [id number] Vitals vital signs normal Minimal leakage White count yesterday was 15.6 less than admission white count Abdomen soft nontender Patient is not on any form of medication continue present therapy

## 2013-08-20 MED ORDER — MAGNESIUM HYDROXIDE 400 MG/5ML PO SUSP
30.0000 mL | Freq: Every day | ORAL | Status: DC | PRN
  Administered 2013-08-20 – 2013-08-22 (×3): 30 mL via ORAL
  Filled 2013-08-20 (×4): qty 30

## 2013-08-20 NOTE — Progress Notes (Signed)
Patient ID: Taylor Flowers, female   DOB: 10/18/1990, 23 y.o.   MRN: [id number] Vital signs normal Abdomen soft nontender Is a yellowish discharge No change

## 2013-08-21 NOTE — Progress Notes (Signed)
Patient ID: Taylor Flowers, female   DOB: 07/07/1990, 23 y.o.   MRN: [id number] Vital signs normal Abdomen soft nontender Use 3  pads Status unchanged in a

## 2013-08-21 NOTE — Progress Notes (Signed)
Antenatal Nutrition Assessment:  Currently  [redacted] weeks gestation, with incompetent cervix, S/P cervical cerclage. Height  72 "  Weight 304 lbs  pre-pregnancy weight 300 lbs .  Pre-pregnancy  BMI 40.7  IBW 160 lbs Total weight gain 4 lbs Weight gain goals 12 lbs Estimated needs: 2500-2600 kcal/day, 98-107 grams protein/day, 2.6 liters fluid/day  Regular diet tolerated well, appetite good. Current diet prescription will provide for increased needs. Snack menu and cafeteria menu provided to patient for increased options.  No abnormal nutrition related labs  Nutrition Dx: Increased nutrient needs r/t pregnancy and fetal growth requirements aeb [redacted] weeks gestation.  No educational needs assessed at this time.  Joaquin CourtsKimberly Harris, RD, LDN, CNSC Pager [phone] After Hours Pager [phone]

## 2013-08-22 MED ORDER — CLINDAMYCIN PHOSPHATE 900 MG/50ML IV SOLN
900.0000 mg | Freq: Three times a day (TID) | INTRAVENOUS | Status: DC
  Administered 2013-08-23 (×4): 900 mg via INTRAVENOUS
  Filled 2013-08-22 (×7): qty 50

## 2013-08-22 MED ORDER — HYDROMORPHONE 0.3 MG/ML IV SOLN
INTRAVENOUS | Status: DC
  Administered 2013-08-23: via INTRAVENOUS
  Filled 2013-08-22: qty 25

## 2013-08-22 MED ORDER — DIPHENHYDRAMINE HCL 12.5 MG/5ML PO ELIX
12.5000 mg | ORAL_SOLUTION | Freq: Four times a day (QID) | ORAL | Status: DC | PRN
  Filled 2013-08-22: qty 5

## 2013-08-22 MED ORDER — ONDANSETRON HCL 4 MG/2ML IJ SOLN: 4.0000 mg | Freq: Four times a day (QID) | INTRAMUSCULAR | Status: DC | PRN

## 2013-08-22 MED ORDER — DIPHENHYDRAMINE HCL 50 MG/ML IJ SOLN: 12.5000 mg | Freq: Four times a day (QID) | INTRAMUSCULAR | Status: DC | PRN

## 2013-08-22 MED ORDER — MISOPROSTOL 200 MCG PO TABS
600.0000 ug | ORAL_TABLET | Freq: Four times a day (QID) | ORAL | Status: DC
  Administered 2013-08-23: 600 ug via VAGINAL
  Filled 2013-08-22 (×3): qty 3

## 2013-08-22 MED ORDER — SODIUM CHLORIDE 0.9 % IJ SOLN: 9.0000 mL | INTRAMUSCULAR | Status: DC | PRN

## 2013-08-22 MED ORDER — NALOXONE HCL 0.4 MG/ML IJ SOLN: 0.4000 mg | INTRAMUSCULAR | Status: DC | PRN

## 2013-08-22 NOTE — Progress Notes (Signed)
Patient ID: Taylor Flowers, female   DOB: 08/15/1990, 23 y.o.   MRN: [id number] Vital signs normal And a gush of fluid last night but is not having any cramping and her abdomen remained soft so her management would not change

## 2013-08-22 NOTE — Progress Notes (Signed)
UR completed 

## 2013-08-22 NOTE — Progress Notes (Signed)
Patient ID: Taylor CoopRaquel M Flowers, female   DOB: 05/05/1990, 23 y.o.   MRN: [id number] Called shortly after  11 p.m. by the nurse stating that the patient was having crampy abdominal pain and on exam the uterus was tender we discussed the fact that shwas   showing signs of infection  Will  have to terminate the pregnancy at this time on exam   feet in the vagina and the cerclage she was placed on the bedpan and speculum placed in the vagina cervix  visualised  the cerclage removed tonight the neonatologist spoke with her and said he would try everything to see if the baby she's   22 weeks and 4 days 600 Cytotec placed in the vagina and she was started on clindamycin 900 every 8 hours and PCA for pain

## 2013-08-23 ENCOUNTER — Inpatient Hospital Stay (HOSPITAL_COMMUNITY): Payer: 59

## 2013-08-23 ENCOUNTER — Encounter (HOSPITAL_COMMUNITY)
Admission: AD | Disposition: A | Discharge status: Home / Self Care | Payer: Self-pay | Source: Ambulatory Visit | Attending: Obstetrics

## 2013-08-23 ENCOUNTER — Encounter (HOSPITAL_COMMUNITY): Payer: Self-pay | Admitting: *Deleted

## 2013-08-23 ENCOUNTER — Encounter (HOSPITAL_COMMUNITY): Payer: 59 | Admitting: Anesthesiology

## 2013-08-23 ENCOUNTER — Inpatient Hospital Stay (HOSPITAL_COMMUNITY): Payer: 59 | Admitting: Anesthesiology

## 2013-08-23 DIAGNOSIS — O039 Complete or unspecified spontaneous abortion without complication: Secondary | ICD-10-CM | POA: Diagnosis present

## 2013-08-23 DIAGNOSIS — N883 Incompetence of cervix uteri: Secondary | ICD-10-CM

## 2013-08-23 HISTORY — DX: Complete or unspecified spontaneous abortion without complication: O03.9

## 2013-08-23 HISTORY — PX: DILATION AND CURETTAGE OF UTERUS: SHX78

## 2013-08-23 LAB — CBC
HCT: 31.3 % — ABNORMAL LOW (ref 36.0–46.0)
HCT: 28.2 % — ABNORMAL LOW (ref 36.0–46.0)
Hemoglobin: 10.9 g/dL — ABNORMAL LOW (ref 12.0–15.0)
Hemoglobin: 9.8 g/dL — ABNORMAL LOW (ref 12.0–15.0)
MCH: 30.4 pg (ref 26.0–34.0)
MCH: 30.2 pg (ref 26.0–34.0)
MCHC: 34.8 g/dL (ref 30.0–36.0)
MCHC: 34.8 g/dL (ref 30.0–36.0)
MCV: 87.4 fL (ref 78.0–100.0)
MCV: 86.8 fL (ref 78.0–100.0)
PLATELETS: 288 10*3/uL (ref 150–400)
Platelets: 313 10*3/uL (ref 150–400)
RBC: 3.58 MIL/uL — ABNORMAL LOW (ref 3.87–5.11)
RBC: 3.25 MIL/uL — ABNORMAL LOW (ref 3.87–5.11)
RDW: 13.1 % (ref 11.5–15.5)
RDW: 13.1 % (ref 11.5–15.5)
WBC: 36.4 10*3/uL — ABNORMAL HIGH (ref 4.0–10.5)
WBC: 25 10*3/uL — ABNORMAL HIGH (ref 4.0–10.5)

## 2013-08-23 LAB — PREPARE RBC (CROSSMATCH)

## 2013-08-23 SURGERY — DILATION AND CURETTAGE
Anesthesia: General | Site: Vagina

## 2013-08-23 MED ORDER — LIDOCAINE HCL (CARDIAC) 20 MG/ML IV SOLN
INTRAVENOUS | Status: AC
  Filled 2013-08-23: qty 5

## 2013-08-23 MED ORDER — FENTANYL CITRATE 0.05 MG/ML IJ SOLN
INTRAMUSCULAR | Status: DC | PRN
  Administered 2013-08-23 (×2): 50 ug via INTRAVENOUS

## 2013-08-23 MED ORDER — LIDOCAINE HCL 1 % IJ SOLN
INTRAMUSCULAR | Status: AC
  Filled 2013-08-23: qty 20

## 2013-08-23 MED ORDER — SODIUM CHLORIDE 0.9 % IV SOLN: Freq: Once | INTRAVENOUS | Status: DC

## 2013-08-23 MED ORDER — BENZOCAINE-MENTHOL 20-0.5 % EX AERO
1.0000 "application " | INHALATION_SPRAY | CUTANEOUS | Status: DC | PRN
  Filled 2013-08-23: qty 56

## 2013-08-23 MED ORDER — OXYTOCIN 40 UNITS IN LACTATED RINGERS INFUSION - SIMPLE MED
INTRAVENOUS | Status: AC
  Filled 2013-08-23: qty 1000

## 2013-08-23 MED ORDER — WITCH HAZEL-GLYCERIN EX PADS: 1.0000 "application " | MEDICATED_PAD | CUTANEOUS | Status: DC | PRN

## 2013-08-23 MED ORDER — HYDROMORPHONE HCL PF 1 MG/ML IJ SOLN: 0.2500 mg | INTRAMUSCULAR | Status: DC | PRN

## 2013-08-23 MED ORDER — ZOLPIDEM TARTRATE 5 MG PO TABS
5.0000 mg | ORAL_TABLET | Freq: Every evening | ORAL | Status: DC | PRN
  Administered 2013-08-23: 5 mg via ORAL
  Filled 2013-08-23: qty 1

## 2013-08-23 MED ORDER — TETANUS-DIPHTH-ACELL PERTUSSIS 5-2.5-18.5 LF-MCG/0.5 IM SUSP: 0.5000 mL | Freq: Once | INTRAMUSCULAR | Status: DC

## 2013-08-23 MED ORDER — PROMETHAZINE HCL 25 MG/ML IJ SOLN
INTRAMUSCULAR | Status: AC
  Filled 2013-08-23: qty 1

## 2013-08-23 MED ORDER — PROPOFOL 10 MG/ML IV EMUL
INTRAVENOUS | Status: AC
  Filled 2013-08-23: qty 20

## 2013-08-23 MED ORDER — SUCCINYLCHOLINE CHLORIDE 20 MG/ML IJ SOLN
INTRAMUSCULAR | Status: DC | PRN
  Administered 2013-08-23: 120 mg via INTRAVENOUS

## 2013-08-23 MED ORDER — PHENYLEPHRINE 40 MCG/ML (10ML) SYRINGE FOR IV PUSH (FOR BLOOD PRESSURE SUPPORT)
PREFILLED_SYRINGE | INTRAVENOUS | Status: AC
  Filled 2013-08-23: qty 5

## 2013-08-23 MED ORDER — SENNOSIDES-DOCUSATE SODIUM 8.6-50 MG PO TABS
2.0000 | ORAL_TABLET | ORAL | Status: DC
  Administered 2013-08-23: 2 via ORAL
  Filled 2013-08-23: qty 2

## 2013-08-23 MED ORDER — LACTATED RINGERS IV SOLN
INTRAVENOUS | Status: DC | PRN
  Administered 2013-08-23 (×3): via INTRAVENOUS

## 2013-08-23 MED ORDER — OXYCODONE HCL 5 MG PO TABS: 5.0000 mg | ORAL_TABLET | Freq: Once | ORAL | Status: DC | PRN

## 2013-08-23 MED ORDER — MIDAZOLAM HCL 2 MG/2ML IJ SOLN
INTRAMUSCULAR | Status: AC
  Filled 2013-08-23: qty 2

## 2013-08-23 MED ORDER — FENTANYL CITRATE 0.05 MG/ML IJ SOLN
INTRAMUSCULAR | Status: AC
  Filled 2013-08-23: qty 2

## 2013-08-23 MED ORDER — PROMETHAZINE HCL 25 MG/ML IJ SOLN
6.2500 mg | INTRAMUSCULAR | Status: AC | PRN
  Administered 2013-08-23 (×2): 6.25 mg via INTRAVENOUS

## 2013-08-23 MED ORDER — PRENATAL MULTIVITAMIN CH
1.0000 | ORAL_TABLET | Freq: Every day | ORAL | Status: DC
  Administered 2013-08-23 – 2013-08-24 (×2): 1 via ORAL
  Filled 2013-08-23 (×2): qty 1

## 2013-08-23 MED ORDER — OXYCODONE HCL 5 MG/5ML PO SOLN
5.0000 mg | Freq: Once | ORAL | Status: DC | PRN
Start: 2013-08-23 — End: 2013-08-23

## 2013-08-23 MED ORDER — FERROUS SULFATE 325 (65 FE) MG PO TABS
325.0000 mg | ORAL_TABLET | Freq: Two times a day (BID) | ORAL | Status: DC
  Administered 2013-08-23 – 2013-08-24 (×4): 325 mg via ORAL
  Filled 2013-08-23 (×3): qty 1

## 2013-08-23 MED ORDER — OXYCODONE-ACETAMINOPHEN 5-325 MG PO TABS
1.0000 | ORAL_TABLET | ORAL | Status: DC | PRN
  Administered 2013-08-24: 1 via ORAL
  Filled 2013-08-23 (×2): qty 1

## 2013-08-23 MED ORDER — LACTATED RINGERS IR SOLN: 3000.0000 mL | Status: DC

## 2013-08-23 MED ORDER — DIPHENHYDRAMINE HCL 25 MG PO CAPS: 25.0000 mg | ORAL_CAPSULE | Freq: Four times a day (QID) | ORAL | Status: DC | PRN

## 2013-08-23 MED ORDER — OXYTOCIN 10 UNIT/ML IJ SOLN
40.0000 [IU] | INTRAVENOUS | Status: DC | PRN
  Administered 2013-08-23: 40 [IU] via INTRAVENOUS

## 2013-08-23 MED ORDER — SIMETHICONE 80 MG PO CHEW: 80.0000 mg | CHEWABLE_TABLET | ORAL | Status: DC | PRN

## 2013-08-23 MED ORDER — LANOLIN HYDROUS EX OINT: TOPICAL_OINTMENT | CUTANEOUS | Status: DC | PRN

## 2013-08-23 MED ORDER — PROMETHAZINE HCL 25 MG/ML IJ SOLN
INTRAMUSCULAR | Status: DC
Start: 2013-08-23 — End: 2013-08-23
  Filled 2013-08-23: qty 1

## 2013-08-23 MED ORDER — LIDOCAINE HCL (CARDIAC) 20 MG/ML IV SOLN
INTRAVENOUS | Status: DC | PRN
  Administered 2013-08-23: 30 mg via INTRAVENOUS

## 2013-08-23 MED ORDER — ONDANSETRON HCL 4 MG PO TABS: 4.0000 mg | ORAL_TABLET | ORAL | Status: DC | PRN

## 2013-08-23 MED ORDER — PHENYLEPHRINE HCL 10 MG/ML IJ SOLN
INTRAMUSCULAR | Status: DC | PRN
  Administered 2013-08-23 (×3): 40 mg via INTRAVENOUS
  Administered 2013-08-23 (×3): 80 mg via INTRAVENOUS

## 2013-08-23 MED ORDER — ONDANSETRON HCL 4 MG/2ML IJ SOLN
INTRAMUSCULAR | Status: DC | PRN
  Administered 2013-08-23: 4 mg via INTRAVENOUS

## 2013-08-23 MED ORDER — LACTATED RINGERS IV SOLN
INTRAVENOUS | Status: DC | PRN
  Administered 2013-08-23: 04:00:00 via INTRAVENOUS

## 2013-08-23 MED ORDER — IBUPROFEN 600 MG PO TABS
600.0000 mg | ORAL_TABLET | Freq: Four times a day (QID) | ORAL | Status: DC
  Administered 2013-08-23 – 2013-08-24 (×6): 600 mg via ORAL
  Filled 2013-08-23 (×6): qty 1

## 2013-08-23 MED ORDER — DIBUCAINE 1 % RE OINT: 1.0000 "application " | TOPICAL_OINTMENT | RECTAL | Status: DC | PRN

## 2013-08-23 MED ORDER — ONDANSETRON HCL 4 MG/2ML IJ SOLN
INTRAMUSCULAR | Status: AC
  Filled 2013-08-23: qty 2

## 2013-08-23 MED ORDER — ONDANSETRON HCL 4 MG/2ML IJ SOLN: 4.0000 mg | INTRAMUSCULAR | Status: DC | PRN

## 2013-08-23 MED ORDER — LACTATED RINGERS IV SOLN
INTRAVENOUS | Status: DC
  Administered 2013-08-23: 16:00:00 via INTRAVENOUS

## 2013-08-23 MED ORDER — MEPERIDINE HCL 25 MG/ML IJ SOLN: 6.2500 mg | INTRAMUSCULAR | Status: DC | PRN

## 2013-08-23 MED ORDER — OXYTOCIN 10 UNIT/ML IJ SOLN
INTRAMUSCULAR | Status: AC
  Filled 2013-08-23: qty 4

## 2013-08-23 MED ORDER — PROPOFOL 10 MG/ML IV BOLUS
INTRAVENOUS | Status: DC | PRN
  Administered 2013-08-23: 20 mg via INTRAVENOUS
  Administered 2013-08-23: 180 mg via INTRAVENOUS

## 2013-08-23 SURGICAL SUPPLY — 20 items
CATH FOLEY 2WAY  5CC 16FR SIL (CATHETERS) ×2
CATH FOLEY 2WAY 5CC 16FR SIL (CATHETERS) ×1 IMPLANT
CATH ROBINSON RED A/P 16FR (CATHETERS) IMPLANT
CLOTH BEACON ORANGE TIMEOUT ST (SAFETY) ×3 IMPLANT
CONTAINER PREFILL 10% NBF 60ML (FORM) IMPLANT
DECANTER SPIKE VIAL GLASS SM (MISCELLANEOUS) ×3 IMPLANT
GAUZE SPONGE 4X4 16PLY XRAY LF (GAUZE/BANDAGES/DRESSINGS) ×3 IMPLANT
GLOVE BIO SURGEON STRL SZ8.5 (GLOVE) IMPLANT
GLOVE SKINSENSE N 8.5 STRL (GLOVE) ×6 IMPLANT
GLOVE SKINSENSE NS SZ7.5 (GLOVE) ×2
GLOVE SKINSENSE NS SZ8.0 LF (GLOVE) ×2
GLOVE SKINSENSE STRL SZ7.5 (GLOVE) ×1 IMPLANT
GLOVE SKINSENSE STRL SZ8.0 LF (GLOVE) ×1 IMPLANT
GOWN STRL REUS W/TWL 2XL LVL3 (GOWN DISPOSABLE) ×3 IMPLANT
GOWN STRL REUS W/TWL LRG LVL3 (GOWN DISPOSABLE) ×3 IMPLANT
PACK VAGINAL MINOR WOMEN LF (CUSTOM PROCEDURE TRAY) ×3 IMPLANT
PAD OB MATERNITY 4.3X12.25 (PERSONAL CARE ITEMS) ×3 IMPLANT
PAD PREP 24X48 CUFFED NSTRL (MISCELLANEOUS) ×3 IMPLANT
TOWEL OR 17X24 6PK STRL BLUE (TOWEL DISPOSABLE) ×6 IMPLANT
WATER STERILE IRR 1000ML POUR (IV SOLUTION) ×3 IMPLANT

## 2013-08-23 NOTE — Anesthesia Postprocedure Evaluation (Signed)
  Anesthesia Post-op Note  Patient: Taylor Flowers  Procedure(s) Performed: Procedure(s): Dilatation and Currettage with Ultrasound Guidance (N/A)  Patient Location: PACU and Women's Unit  Anesthesia Type:General  Level of Consciousness: awake, alert  and oriented  Airway and Oxygen Therapy: Patient Spontanous Breathing  Post-op Pain: mild  Post-op Assessment: Patient's Cardiovascular Status Stable, Respiratory Function Stable, No signs of Nausea or vomiting, Adequate PO intake and Pain level controlled  Post-op Vital Signs: Reviewed and stable  Last Vitals:  Filed Vitals:   08/23/13 0647  BP: 106/71  Pulse: 120  Temp: 37 C  Resp: 18    Complications: No apparent anesthesia complications

## 2013-08-23 NOTE — Progress Notes (Signed)
I provided grief support and grief education over two different visits today.  Ilea is still feeling somewhat angry and traumatized that more could not be done for her baby.  She is having a hard time understanding that the medical team did all that they could do.  I gave her a space to share her feelings and she was able to take in to some extent that at 22 weeks, there was not much more that could have been done.  This will still be a process and we will continue to be a resource for her.  Please page as needs continue to arise or if she would like our presence when she sees her baby again.  [employer] CorporationChaplain Katy Berry Gallacher Pager, [phone] 3:45 PM   08/23/13 1500  Clinical Encounter Type  Visited With Patient;Patient and family together  Visit Type Follow-up;Spiritual support  Spiritual Encounters  Spiritual Needs Emotional;Grief support

## 2013-08-23 NOTE — Progress Notes (Signed)
Patient ID: Taylor Flowers, female   DOB: 09/18/1990, 23 y.o.   MRN: [id number] 1:45 AM and the placenta had not separated a time she is having some clots and unable to remove the placenta to take her back to the operating room to remove the placenta  Under  general anesthesia

## 2013-08-23 NOTE — Op Note (Signed)
Preop diagnosis retained placenta with bleeding after 22 week spontaneous AB Postop diagnosis operative removal of the placenta Anesthesia general Surgeon Dr. Francoise CeoBernard Marshall Procedure patient in the lithotomy position perineum and vagina prepped and draped bladder  Emptied  with a straight catheter speculum placed in the vagina anterior lip of the cervix grasped with tenaculum ultrasound guidance was present and using a large placenta forcep the placenta was removed in pieces and eventually sharp curettage done and a  Remaining  pieces of placenta in the uterus were removed  Until  ultrasound said the cavity was clean the blood loss was 500 cc and the patient tolerated the procedure well taken to recovery room in good condition a

## 2013-08-23 NOTE — Anesthesia Postprocedure Evaluation (Signed)
Anesthesia Post Note  Patient: Taylor Flowers  Procedure(s) Performed: Procedure(s) (LRB): Dilatation and Currettage with Ultrasound Guidance (N/A)  Anesthesia type: General  Patient location: PACU  Post pain: Pain level controlled  Post assessment: Post-op Vital signs reviewed  Last Vitals: BP 104/58  Pulse 118  Temp(Src) 37.2 C (Oral)  Resp 20  Ht 6' (1.829 m)  Wt 304 lb 3.2 oz (137.984 kg)  BMI 41.25 kg/m2  SpO2 100%  LMP 03/17/2013  Post vital signs: Reviewed  Level of consciousness: sedated  Complications: No apparent anesthesia complications

## 2013-08-23 NOTE — Transfer of Care (Signed)
Immediate Anesthesia Transfer of Care Note  Patient: Taylor Flowers  Procedure(s) Performed: Procedure(s): Dilatation and Currettage with Ultrasound Guidance (N/A)  Patient Location: PACU  Anesthesia Type:General  Level of Consciousness: sedated and patient cooperative  Airway & Oxygen Therapy: Patient Spontanous Breathing and Patient connected to nasal cannula oxygen  Post-op Assessment: Report given to PACU RN and Post -op Vital signs reviewed and stable  Post vital signs: Reviewed and stable  Complications: No apparent anesthesia complications

## 2013-08-23 NOTE — Anesthesia Preprocedure Evaluation (Addendum)
Anesthesia Evaluation  Patient identified by MRN, date of birth, ID band Patient awake    Reviewed: Allergy & Precautions, H&P , NPO status , Patient's Chart, lab work & pertinent test results, reviewed documented beta blocker date and time   History of Anesthesia Complications (+) POST - OP SPINAL HEADACHE and history of anesthetic complications  Airway Mallampati: I TM Distance: >3 FB Neck ROM: full    Dental  (+) Teeth Intact   Pulmonary neg pulmonary ROS,  breath sounds clear to auscultation  Pulmonary exam normal       Cardiovascular negative cardio ROS  Rhythm:regular Rate:Normal     Neuro/Psych  Headaches, negative neurological ROS  negative psych ROS   GI/Hepatic negative GI ROS, Neg liver ROS,   Endo/Other  Morbid obesity  Renal/GU negative Renal ROS     Musculoskeletal Tailbone injury last week   Abdominal   Peds  Hematology negative hematology ROS (+)   Anesthesia Other Findings   Reproductive/Obstetrics negative OB ROS                           Anesthesia Physical  Anesthesia Plan  ASA: III and emergent  Anesthesia Plan: General   Post-op Pain Management:    Induction: Intravenous and Rapid sequence  Airway Management Planned: Oral ETT  Additional Equipment:   Intra-op Plan:   Post-operative Plan: Extubation in OR  Informed Consent: I have reviewed the patients History and Physical, chart, labs and discussed the procedure including the risks, benefits and alternatives for the proposed anesthesia with the patient or authorized representative who has indicated his/her understanding and acceptance.   Dental advisory given  Plan Discussed with: CRNA  Anesthesia Plan Comments: (Patient refuses sedation or neuraxial anesthetic. Discussed risk of aspiration and death with GETA. Pt. Desires GA.)       Anesthesia Quick Evaluation

## 2013-08-23 NOTE — Progress Notes (Signed)
Patient ID: Taylor Flowers, female   DOB: 06/01/1990, 23 y.o.   MRN: [id number] 1:45 AM meal fetus removed from the vagina with no difficulty double footling breech Apgar 1 and 0 placenta was delivered at this time fetus foul-smelling

## 2013-08-23 NOTE — Addendum Note (Signed)
Addendum created 08/23/13 0818 by Elbert Ewingsolleen S Sunny Gains, CRNA   Modules edited: Notes Section   Notes Section:  File: [id number]

## 2013-08-24 ENCOUNTER — Encounter (HOSPITAL_COMMUNITY): Payer: Self-pay | Admitting: Obstetrics

## 2013-08-24 LAB — CBC
HCT: 21.8 % — ABNORMAL LOW (ref 36.0–46.0)
HEMOGLOBIN: 7.5 g/dL — AB (ref 12.0–15.0)
MCH: 29.8 pg (ref 26.0–34.0)
MCHC: 34.4 g/dL (ref 30.0–36.0)
MCV: 86.5 fL (ref 78.0–100.0)
PLATELETS: 247 10*3/uL (ref 150–400)
RBC: 2.52 MIL/uL — ABNORMAL LOW (ref 3.87–5.11)
RDW: 13.3 % (ref 11.5–15.5)
WBC: 17.9 10*3/uL — ABNORMAL HIGH (ref 4.0–10.5)
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
## 2013-08-24 NOTE — Progress Notes (Signed)
Pt is discharged in the care of friend. Downstairs per ambulatory with R.N. Daine GipEscort.. Denies any pain or discomfort. Discharged instructions  Were given to pt. Questions were asked and answered. Emotional support was given due to lost. Pt . Verbalize fears about lost. No equipment needed  For home use.

## 2013-08-24 NOTE — Progress Notes (Signed)
Patient ID: Taylor Flowers, female   DOB: 07/28/1990, 23 y.o.   MRN: [id number] vs  signs normal Hemoglobin 7+  Down  from 9+ immediately postop yesterday morning hemoglobin was 10 white count 25 this a.m. white count down to 17 she remains afebrile and some low abdominal cramping   t  moderate lochia she will be discharged today on clindamycin 300 every 6 hours for 10 days Xanax 0.50 times a day when necessary for anxiety Percocet for pain and ferrous sulfate for her anemia to see me in 6 weeks

## 2013-08-24 NOTE — Discharge Instructions (Signed)
Discharge instructions   You can wash your hair  Shower  Eat what you want  Drink what you want  See me in 6 weeks  Your ankles are going to swell more in the next 2 weeks than when pregnant  No sex for 6 weeks   Kadia Abaya A, MD 08/24/2013

## 2013-08-25 ENCOUNTER — Ambulatory Visit (HOSPITAL_COMMUNITY): Payer: PRIVATE HEALTH INSURANCE

## 2013-08-25 LAB — TYPE AND SCREEN
ABO/RH(D): O POS
ANTIBODY SCREEN: NEGATIVE
UNIT DIVISION: 0
Unit division: 0

## 2013-11-20 ENCOUNTER — Encounter (HOSPITAL_COMMUNITY): Payer: Self-pay | Admitting: Obstetrics

## 2013-12-17 ENCOUNTER — Encounter (HOSPITAL_COMMUNITY): Payer: Self-pay | Admitting: *Deleted

## 2013-12-17 ENCOUNTER — Emergency Department (HOSPITAL_COMMUNITY): Payer: 59

## 2013-12-17 ENCOUNTER — Emergency Department (HOSPITAL_COMMUNITY)
Admission: EM | Admit: 2013-12-17 | Discharge: 2013-12-17 | Disposition: A | Discharge status: Home / Self Care | Payer: 59 | Attending: Emergency Medicine | Admitting: Emergency Medicine

## 2013-12-17 DIAGNOSIS — Y9389 Activity, other specified: Secondary | ICD-10-CM | POA: Insufficient documentation

## 2013-12-17 DIAGNOSIS — Z79899 Other long term (current) drug therapy: Secondary | ICD-10-CM | POA: Insufficient documentation

## 2013-12-17 DIAGNOSIS — Z3202 Encounter for pregnancy test, result negative: Secondary | ICD-10-CM | POA: Diagnosis not present

## 2013-12-17 DIAGNOSIS — S62319A Displaced fracture of base of unspecified metacarpal bone, initial encounter for closed fracture: Secondary | ICD-10-CM

## 2013-12-17 DIAGNOSIS — S62317A Displaced fracture of base of fifth metacarpal bone. left hand, initial encounter for closed fracture: Secondary | ICD-10-CM | POA: Insufficient documentation

## 2013-12-17 DIAGNOSIS — Y9289 Other specified places as the place of occurrence of the external cause: Secondary | ICD-10-CM | POA: Diagnosis not present

## 2013-12-17 DIAGNOSIS — Z91040 Latex allergy status: Secondary | ICD-10-CM | POA: Insufficient documentation

## 2013-12-17 DIAGNOSIS — S6992XA Unspecified injury of left wrist, hand and finger(s), initial encounter: Secondary | ICD-10-CM | POA: Diagnosis present

## 2013-12-17 DIAGNOSIS — Z8669 Personal history of other diseases of the nervous system and sense organs: Secondary | ICD-10-CM | POA: Diagnosis not present

## 2013-12-17 DIAGNOSIS — Y998 Other external cause status: Secondary | ICD-10-CM | POA: Diagnosis not present

## 2013-12-17 DIAGNOSIS — Z8742 Personal history of other diseases of the female genital tract: Secondary | ICD-10-CM | POA: Insufficient documentation

## 2013-12-17 DIAGNOSIS — W228XXA Striking against or struck by other objects, initial encounter: Secondary | ICD-10-CM | POA: Diagnosis not present

## 2013-12-17 LAB — POC URINE PREG, ED: Preg Test, Ur: NEGATIVE

## 2013-12-17 MED ORDER — NAPROXEN 500 MG PO TABS: 500.0000 mg | ORAL_TABLET | Freq: Two times a day (BID) | ORAL | Status: DC

## 2013-12-17 MED ORDER — NAPROXEN 250 MG PO TABS
500.0000 mg | ORAL_TABLET | Freq: Once | ORAL | Status: AC
  Administered 2013-12-17: 500 mg via ORAL
  Filled 2013-12-17: qty 2
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
## 2013-12-17 NOTE — ED Notes (Addendum)
EDP at bedside  

## 2013-12-17 NOTE — Discharge Instructions (Signed)
Please call your doctor for a followup appointment within 24-48 hours. When you talk to your doctor please let them know that you were seen in the emergency department and have them acquire all of your records so that they can discuss the findings with you and formulate a treatment plan to fully care for your new and ongoing problems. ° °
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
## 2013-12-19 DIAGNOSIS — S62309A Unspecified fracture of unspecified metacarpal bone, initial encounter for closed fracture: Secondary | ICD-10-CM

## 2013-12-19 HISTORY — DX: Unspecified fracture of unspecified metacarpal bone, initial encounter for closed fracture: S62.309A

## 2013-12-21 ENCOUNTER — Encounter (HOSPITAL_BASED_OUTPATIENT_CLINIC_OR_DEPARTMENT_OTHER): Payer: Self-pay | Admitting: *Deleted

## 2013-12-21 ENCOUNTER — Other Ambulatory Visit: Payer: Self-pay | Admitting: Orthopedic Surgery

## 2013-12-22 ENCOUNTER — Encounter (HOSPITAL_BASED_OUTPATIENT_CLINIC_OR_DEPARTMENT_OTHER)
Admission: RE | Admit: 2013-12-22 | Discharge: 2013-12-22 | Disposition: A | Discharge status: Home / Self Care | Payer: 59 | Source: Ambulatory Visit | Attending: Orthopedic Surgery | Admitting: Orthopedic Surgery

## 2013-12-22 DIAGNOSIS — Y998 Other external cause status: Secondary | ICD-10-CM | POA: Diagnosis not present

## 2013-12-22 DIAGNOSIS — S62307A Unspecified fracture of fifth metacarpal bone, left hand, initial encounter for closed fracture: Secondary | ICD-10-CM | POA: Diagnosis not present

## 2013-12-22 DIAGNOSIS — Z885 Allergy status to narcotic agent status: Secondary | ICD-10-CM | POA: Diagnosis not present

## 2013-12-22 DIAGNOSIS — Y9389 Activity, other specified: Secondary | ICD-10-CM | POA: Diagnosis not present

## 2013-12-22 DIAGNOSIS — Z91040 Latex allergy status: Secondary | ICD-10-CM | POA: Diagnosis not present

## 2013-12-22 DIAGNOSIS — G43909 Migraine, unspecified, not intractable, without status migrainosus: Secondary | ICD-10-CM | POA: Diagnosis not present

## 2013-12-22 DIAGNOSIS — Y9289 Other specified places as the place of occurrence of the external cause: Secondary | ICD-10-CM | POA: Diagnosis not present

## 2013-12-22 DIAGNOSIS — Z8614 Personal history of Methicillin resistant Staphylococcus aureus infection: Secondary | ICD-10-CM | POA: Diagnosis not present

## 2013-12-22 DIAGNOSIS — Z888 Allergy status to other drugs, medicaments and biological substances status: Secondary | ICD-10-CM | POA: Diagnosis not present

## 2013-12-22 DIAGNOSIS — F419 Anxiety disorder, unspecified: Secondary | ICD-10-CM | POA: Diagnosis not present

## 2013-12-22 DIAGNOSIS — W501XXA Accidental kick by another person, initial encounter: Secondary | ICD-10-CM | POA: Diagnosis not present

## 2013-12-22 LAB — PREGNANCY, URINE: Preg Test, Ur: NEGATIVE

## 2013-12-22 LAB — HCG, SERUM, QUALITATIVE: PREG SERUM: NEGATIVE

## 2013-12-22 NOTE — Pre-Procedure Instructions (Signed)
Pt. seen for anesthesia airway evaluation by Dr. Noreene LarssonJoslin; pt. OK to come for surgery.

## 2013-12-25 ENCOUNTER — Encounter (HOSPITAL_BASED_OUTPATIENT_CLINIC_OR_DEPARTMENT_OTHER): Payer: Self-pay | Admitting: Anesthesiology

## 2013-12-25 ENCOUNTER — Ambulatory Visit (HOSPITAL_BASED_OUTPATIENT_CLINIC_OR_DEPARTMENT_OTHER): Payer: 59 | Admitting: Anesthesiology

## 2013-12-25 ENCOUNTER — Encounter (HOSPITAL_BASED_OUTPATIENT_CLINIC_OR_DEPARTMENT_OTHER)
Admission: RE | Disposition: A | Discharge status: Home / Self Care | Payer: Self-pay | Source: Ambulatory Visit | Attending: Orthopedic Surgery

## 2013-12-25 ENCOUNTER — Ambulatory Visit (HOSPITAL_BASED_OUTPATIENT_CLINIC_OR_DEPARTMENT_OTHER)
Admission: RE | Admit: 2013-12-25 | Discharge: 2013-12-25 | Disposition: A | Discharge status: Home / Self Care | Payer: 59 | Source: Ambulatory Visit | Attending: Orthopedic Surgery | Admitting: Orthopedic Surgery

## 2013-12-25 DIAGNOSIS — Z888 Allergy status to other drugs, medicaments and biological substances status: Secondary | ICD-10-CM | POA: Insufficient documentation

## 2013-12-25 DIAGNOSIS — Z8614 Personal history of Methicillin resistant Staphylococcus aureus infection: Secondary | ICD-10-CM | POA: Insufficient documentation

## 2013-12-25 DIAGNOSIS — Z885 Allergy status to narcotic agent status: Secondary | ICD-10-CM | POA: Insufficient documentation

## 2013-12-25 DIAGNOSIS — S62307A Unspecified fracture of fifth metacarpal bone, left hand, initial encounter for closed fracture: Secondary | ICD-10-CM | POA: Insufficient documentation

## 2013-12-25 DIAGNOSIS — Y998 Other external cause status: Secondary | ICD-10-CM | POA: Insufficient documentation

## 2013-12-25 DIAGNOSIS — G43909 Migraine, unspecified, not intractable, without status migrainosus: Secondary | ICD-10-CM | POA: Insufficient documentation

## 2013-12-25 DIAGNOSIS — Y9289 Other specified places as the place of occurrence of the external cause: Secondary | ICD-10-CM | POA: Insufficient documentation

## 2013-12-25 DIAGNOSIS — Z91040 Latex allergy status: Secondary | ICD-10-CM | POA: Insufficient documentation

## 2013-12-25 DIAGNOSIS — Y9389 Activity, other specified: Secondary | ICD-10-CM | POA: Insufficient documentation

## 2013-12-25 DIAGNOSIS — W501XXA Accidental kick by another person, initial encounter: Secondary | ICD-10-CM | POA: Insufficient documentation

## 2013-12-25 DIAGNOSIS — F419 Anxiety disorder, unspecified: Secondary | ICD-10-CM | POA: Insufficient documentation

## 2013-12-25 HISTORY — PX: CLOSED REDUCTION METACARPAL WITH PERCUTANEOUS PINNING: SHX5613

## 2013-12-25 HISTORY — DX: Unspecified fracture of unspecified metacarpal bone, initial encounter for closed fracture: S62.309A

## 2013-12-25 HISTORY — DX: Anxiety disorder, unspecified: F41.9

## 2013-12-25 HISTORY — DX: Personal history of Methicillin resistant Staphylococcus aureus infection: Z86.14

## 2013-12-25 HISTORY — DX: Migraine, unspecified, not intractable, without status migrainosus: G43.909

## 2013-12-25 LAB — POCT HEMOGLOBIN-HEMACUE: HEMOGLOBIN: 12.1 g/dL (ref 12.0–15.0)

## 2013-12-25 SURGERY — CLOSED REDUCTION, FRACTURE, METACARPAL BONE, WITH PERCUTANEOUS PINNING
Anesthesia: General | Laterality: Left

## 2013-12-25 MED ORDER — HYDROMORPHONE HCL 1 MG/ML IJ SOLN
0.2500 mg | INTRAMUSCULAR | Status: DC | PRN
  Administered 2013-12-25 (×2): 0.5 mg via INTRAVENOUS

## 2013-12-25 MED ORDER — MIDAZOLAM HCL 5 MG/5ML IJ SOLN
INTRAMUSCULAR | Status: DC | PRN
  Administered 2013-12-25: 2 mg via INTRAVENOUS

## 2013-12-25 MED ORDER — HYDROMORPHONE HCL 1 MG/ML IJ SOLN
INTRAMUSCULAR | Status: AC
  Filled 2013-12-25: qty 1

## 2013-12-25 MED ORDER — CEFAZOLIN SODIUM 1-5 GM-% IV SOLN
INTRAVENOUS | Status: AC
  Filled 2013-12-25: qty 50

## 2013-12-25 MED ORDER — FENTANYL CITRATE 0.05 MG/ML IJ SOLN
INTRAMUSCULAR | Status: DC | PRN
  Administered 2013-12-25 (×2): 50 ug via INTRAVENOUS
  Administered 2013-12-25: 25 ug via INTRAVENOUS

## 2013-12-25 MED ORDER — SCOPOLAMINE 1 MG/3DAYS TD PT72
MEDICATED_PATCH | TRANSDERMAL | Status: AC
  Filled 2013-12-25: qty 1

## 2013-12-25 MED ORDER — OXYCODONE-ACETAMINOPHEN 5-325 MG PO TABS: ORAL_TABLET | ORAL | Status: DC

## 2013-12-25 MED ORDER — SCOPOLAMINE 1 MG/3DAYS TD PT72
1.0000 | MEDICATED_PATCH | TRANSDERMAL | Status: DC
  Administered 2013-12-25: 1.5 mg via TRANSDERMAL

## 2013-12-25 MED ORDER — MIDAZOLAM HCL 2 MG/2ML IJ SOLN: 1.0000 mg | INTRAMUSCULAR | Status: DC | PRN

## 2013-12-25 MED ORDER — DEXTROSE 5 % IV SOLN
3.0000 g | INTRAVENOUS | Status: DC | PRN
  Administered 2013-12-25: 3 g via INTRAVENOUS

## 2013-12-25 MED ORDER — LACTATED RINGERS IV SOLN
INTRAVENOUS | Status: DC
  Administered 2013-12-25 (×2): via INTRAVENOUS

## 2013-12-25 MED ORDER — FENTANYL CITRATE 0.05 MG/ML IJ SOLN
INTRAMUSCULAR | Status: AC
  Filled 2013-12-25: qty 6

## 2013-12-25 MED ORDER — LIDOCAINE HCL (CARDIAC) 20 MG/ML IV SOLN
INTRAVENOUS | Status: DC | PRN
  Administered 2013-12-25: 100 mg via INTRAVENOUS

## 2013-12-25 MED ORDER — MIDAZOLAM HCL 2 MG/2ML IJ SOLN
INTRAMUSCULAR | Status: AC
  Filled 2013-12-25: qty 2

## 2013-12-25 MED ORDER — CEFAZOLIN SODIUM-DEXTROSE 2-3 GM-% IV SOLR
INTRAVENOUS | Status: AC
  Filled 2013-12-25: qty 50

## 2013-12-25 MED ORDER — OXYCODONE HCL 5 MG/5ML PO SOLN: 5.0000 mg | Freq: Once | ORAL | Status: AC | PRN

## 2013-12-25 MED ORDER — OXYCODONE HCL 5 MG PO TABS
5.0000 mg | ORAL_TABLET | Freq: Once | ORAL | Status: AC | PRN
  Administered 2013-12-25: 5 mg via ORAL

## 2013-12-25 MED ORDER — DEXAMETHASONE SODIUM PHOSPHATE 10 MG/ML IJ SOLN
INTRAMUSCULAR | Status: DC | PRN
  Administered 2013-12-25: 10 mg via INTRAVENOUS

## 2013-12-25 MED ORDER — BUPIVACAINE HCL (PF) 0.25 % IJ SOLN
INTRAMUSCULAR | Status: DC | PRN
  Administered 2013-12-25: 8 mL

## 2013-12-25 MED ORDER — OXYCODONE HCL 5 MG PO TABS
ORAL_TABLET | ORAL | Status: AC
  Filled 2013-12-25: qty 1

## 2013-12-25 MED ORDER — CHLORHEXIDINE GLUCONATE 4 % EX LIQD: 60.0000 mL | Freq: Once | CUTANEOUS | Status: DC

## 2013-12-25 MED ORDER — FENTANYL CITRATE 0.05 MG/ML IJ SOLN: 50.0000 ug | INTRAMUSCULAR | Status: DC | PRN

## 2013-12-25 MED ORDER — PROPOFOL 10 MG/ML IV BOLUS
INTRAVENOUS | Status: DC | PRN
  Administered 2013-12-25: 200 mg via INTRAVENOUS

## 2013-12-25 MED ORDER — ONDANSETRON HCL 4 MG/2ML IJ SOLN: 4.0000 mg | Freq: Once | INTRAMUSCULAR | Status: DC | PRN

## 2013-12-25 MED ORDER — ONDANSETRON HCL 4 MG/2ML IJ SOLN
INTRAMUSCULAR | Status: DC | PRN
  Administered 2013-12-25: 4 mg via INTRAVENOUS

## 2013-12-25 MED ORDER — DEXTROSE 5 % IV SOLN: 3.0000 g | INTRAVENOUS | Status: DC

## 2013-12-25 SURGICAL SUPPLY — 57 items
BANDAGE ELASTIC 3 VELCRO ST LF (GAUZE/BANDAGES/DRESSINGS) ×3 IMPLANT
BLADE MINI RND TIP GREEN BEAV (BLADE) IMPLANT
BLADE SURG 15 STRL LF DISP TIS (BLADE) ×2 IMPLANT
BLADE SURG 15 STRL SS (BLADE) ×4
BNDG ELASTIC 2 VLCR STRL LF (GAUZE/BANDAGES/DRESSINGS) IMPLANT
BNDG ESMARK 4X9 LF (GAUZE/BANDAGES/DRESSINGS) ×3 IMPLANT
BNDG GAUZE ELAST 4 BULKY (GAUZE/BANDAGES/DRESSINGS) ×3 IMPLANT
BNDG PLASTER X FAST 3X3 WHT LF (CAST SUPPLIES) ×3 IMPLANT
CHLORAPREP W/TINT 26ML (MISCELLANEOUS) ×3 IMPLANT
CORDS BIPOLAR (ELECTRODE) ×3 IMPLANT
COVER BACK TABLE 60X90IN (DRAPES) ×3 IMPLANT
COVER MAYO STAND STRL (DRAPES) ×3 IMPLANT
CUFF TOURNIQUET SINGLE 18IN (TOURNIQUET CUFF) IMPLANT
CUFF TOURNIQUET SINGLE 24IN (TOURNIQUET CUFF) ×3 IMPLANT
DRAPE EXTREMITY T 121X128X90 (DRAPE) ×3 IMPLANT
DRAPE OEC MINIVIEW 54X84 (DRAPES) ×3 IMPLANT
DRAPE SURG 17X23 STRL (DRAPES) ×3 IMPLANT
GAUZE SPONGE 4X4 12PLY STRL (GAUZE/BANDAGES/DRESSINGS) ×3 IMPLANT
GAUZE XEROFORM 1X8 LF (GAUZE/BANDAGES/DRESSINGS) ×3 IMPLANT
GLOVE BIO SURGEON STRL SZ7.5 (GLOVE) ×3 IMPLANT
GLOVE BIOGEL PI IND STRL 8 (GLOVE) ×1 IMPLANT
GLOVE BIOGEL PI IND STRL 8.5 (GLOVE) IMPLANT
GLOVE BIOGEL PI INDICATOR 8 (GLOVE) ×2
GLOVE BIOGEL PI INDICATOR 8.5 (GLOVE)
GLOVE SURG ORTHO 8.0 STRL STRW (GLOVE) IMPLANT
GOWN STRL REUS W/ TWL LRG LVL3 (GOWN DISPOSABLE) ×1 IMPLANT
GOWN STRL REUS W/ TWL XL LVL3 (GOWN DISPOSABLE) ×1 IMPLANT
GOWN STRL REUS W/TWL LRG LVL3 (GOWN DISPOSABLE) ×2
GOWN STRL REUS W/TWL XL LVL3 (GOWN DISPOSABLE) ×5 IMPLANT
GUIDEWIRE .45X5.910 (WIRE) ×9 IMPLANT
NEEDLE HYPO 22GX1.5 SAFETY (NEEDLE) IMPLANT
NEEDLE HYPO 25X1 1.5 SAFETY (NEEDLE) IMPLANT
NS IRRIG 1000ML POUR BTL (IV SOLUTION) ×3 IMPLANT
PACK BASIN DAY SURGERY FS (CUSTOM PROCEDURE TRAY) ×3 IMPLANT
PAD CAST 3X4 CTTN HI CHSV (CAST SUPPLIES) ×1 IMPLANT
PAD CAST 4YDX4 CTTN HI CHSV (CAST SUPPLIES) IMPLANT
PADDING CAST ABS 4INX4YD NS (CAST SUPPLIES) ×2
PADDING CAST ABS COTTON 4X4 ST (CAST SUPPLIES) ×1 IMPLANT
PADDING CAST COTTON 3X4 STRL (CAST SUPPLIES) ×2
PADDING CAST COTTON 4X4 STRL (CAST SUPPLIES)
SLEEVE SCD COMPRESS KNEE MED (MISCELLANEOUS) IMPLANT
SPLINT PLASTER CAST XFAST 3X15 (CAST SUPPLIES) ×1 IMPLANT
SPLINT PLASTER CAST XFAST 4X15 (CAST SUPPLIES) IMPLANT
SPLINT PLASTER XTRA FAST SET 4 (CAST SUPPLIES)
SPLINT PLASTER XTRA FASTSET 3X (CAST SUPPLIES) ×2
SPONGE GAUZE 4X4 12PLY STER LF (GAUZE/BANDAGES/DRESSINGS) ×3 IMPLANT
STOCKINETTE 4X48 STRL (DRAPES) ×3 IMPLANT
SUT ETHILON 3 0 PS 1 (SUTURE) IMPLANT
SUT ETHILON 4 0 PS 2 18 (SUTURE) IMPLANT
SUT MERSILENE 4 0 P 3 (SUTURE) IMPLANT
SUT VIC AB 3-0 PS1 18 (SUTURE)
SUT VIC AB 3-0 PS1 18XBRD (SUTURE) IMPLANT
SUT VICRYL 4-0 PS2 18IN ABS (SUTURE) IMPLANT
SYR BULB 3OZ (MISCELLANEOUS) ×3 IMPLANT
SYR CONTROL 10ML LL (SYRINGE) IMPLANT
TOWEL OR 17X24 6PK STRL BLUE (TOWEL DISPOSABLE) ×6 IMPLANT
UNDERPAD 30X30 INCONTINENT (UNDERPADS AND DIAPERS) ×3 IMPLANT

## 2013-12-25 NOTE — Anesthesia Postprocedure Evaluation (Signed)
  Anesthesia Post-op Note  Patient: Taylor Flowers  Procedure(s) Performed: Procedure(s): LEFT SMALL METACARPAL CLOSED REDUCTION PERCUTANEOUS PINNING VS OPEN REDUCTION INTERNAL FIXATION (Left)  Patient Location: PACU  Anesthesia Type: General   Level of Consciousness: awake, alert  and oriented  Airway and Oxygen Therapy: Patient Spontanous Breathing  Post-op Pain: mild  Post-op Assessment: Post-op Vital signs reviewed  Post-op Vital Signs: Reviewed  Last Vitals:  Filed Vitals:   12/25/13 1509  BP: 137/76  Pulse: 75  Temp: 36.8 C  Resp: 16    Complications: No apparent anesthesia complications

## 2013-12-25 NOTE — Discharge Instructions (Addendum)
°  Post Anesthesia Home Care Instructions ° °Activity: °Get plenty of rest for the remainder of the day. A responsible adult should stay with you for 24 hours following the procedure.  °For the next 24 hours, DO NOT: °-Drive a car °-Operate machinery °-Drink alcoholic beverages °-Take any medication unless instructed by your physician °-Make any legal decisions or sign important papers. ° °Meals: °Start with liquid foods such as gelatin or soup. Progress to regular foods as tolerated. Avoid greasy, spicy, heavy foods. If nausea and/or vomiting occur, drink only clear liquids until the nausea and/or vomiting subsides. Call your physician if vomiting continues. ° °Special Instructions/Symptoms: °Your throat may feel dry or sore from the anesthesia or the breathing tube placed in your throat during surgery. If this causes discomfort, gargle with warm salt water. The discomfort should disappear within 24 hours. ° °      HAND SURGERY ° °  HOME CARE INSTRUCTIONS ° ° ° °The following instructions have been prepared to help you care for yourself upon your return home today. ° °Wound Care:  °Keep your hand elevated above the level of your heart. Do not allow it to dangle by your side. Keep the dressing dry and do not remove it unless your doctor advises you to do so. He will usually change it at the time of you post-op visit. Moving your fingers is advised to stimulate circulation but will depend on the site of your surgery. Of course, if you have a splint applied your doctor will advise you about movement. ° °Activity:  °Do not drive or operate machinery today. Rest today and then you may return to your normal activity and work as indicated by your physician. ° °Diet: °Drink liquids today or eat a light diet. You may resume a regular diet tomorrow. ° °General expectations: °Pain for two or three days. °Fingers may become slightly swollen.  ° °Unexpected Observations- Call your doctor if any of these occur: °Severe pain not  relieved by pain medication. °Elevated temperature. °Dressing soaked with blood. °Inability to move fingers. °White or bluish color to fingers. ° ° ° ° ° ° °
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
## 2013-12-25 NOTE — Progress Notes (Signed)
   12/25/13 1151  OBSTRUCTIVE SLEEP APNEA  Have you ever been diagnosed with sleep apnea through a sleep study? No  Do you snore loudly (loud enough to be heard through closed doors)?  1  Do you often feel tired, fatigued, or sleepy during the daytime? 1  Has anyone observed you stop breathing during your sleep? 0  Do you have, or are you being treated for high blood pressure? 0  BMI more than 35 kg/m2? 1  Age over 720 years old? 0  Neck circumference greater than 40 cm/16 inches? 1  Gender: 0  Obstructive Sleep Apnea Score 4  Score 4 or greater  Results sent to PCP (Dr.Tammy Leavy CellaBoyd)

## 2013-12-25 NOTE — Transfer of Care (Signed)
Immediate Anesthesia Transfer of Care Note  Patient: Taylor Flowers  Procedure(s) Performed: Procedure(s): LEFT SMALL METACARPAL CLOSED REDUCTION PERCUTANEOUS PINNING VS OPEN REDUCTION INTERNAL FIXATION (Left)  Patient Location: PACU  Anesthesia Type:General  Level of Consciousness: awake and alert   Airway & Oxygen Therapy: Patient Spontanous Breathing and Patient connected to face mask oxygen  Post-op Assessment: Report given to PACU RN and Post -op Vital signs reviewed and stable  Post vital signs: Reviewed and stable  Complications: No apparent anesthesia complications

## 2013-12-25 NOTE — Op Note (Signed)
Intra-operative fluoroscopic images in the AP, lateral, and oblique views were taken and evaluated by myself.  Reduction and hardware placement were confirmed.  There was no intraarticular penetration of permanent hardware.  

## 2013-12-25 NOTE — Brief Op Note (Signed)
12/25/2013  1:42 PM  PATIENT:  Taylor Flowers  23 y.o. female  PRE-OPERATIVE DIAGNOSIS:  LEFT SMALL METACARPAL FRACTURE  POST-OPERATIVE DIAGNOSIS:  LEFT SMALL METACARPAL FRACTURE  PROCEDURE:  Procedure(s): LEFT SMALL METACARPAL CLOSED REDUCTION PERCUTANEOUS PINNING VS OPEN REDUCTION INTERNAL FIXATION (Left)  SURGEON:  Surgeon(s) and Role:    * Betha LoaKevin Canton Yearby, MD - Primary  PHYSICIAN ASSISTANT:   ASSISTANTS: none   ANESTHESIA:   general  EBL:  Total I/O In: 1300 [I.V.:1300] Out: -   BLOOD ADMINISTERED:none  DRAINS: none   LOCAL MEDICATIONS USED:  MARCAINE     SPECIMEN:  No Specimen  DISPOSITION OF SPECIMEN:  N/A  COUNTS:  YES  TOURNIQUET:   Total Tourniquet Time Documented: Upper Arm (Left) - 35 minutes Total: Upper Arm (Left) - 35 minutes   DICTATION: .Other Dictation: Dictation Number [id number]  PLAN OF CARE: Discharge to home after PACU  PATIENT DISPOSITION:  PACU - hemodynamically stable.

## 2013-12-25 NOTE — Anesthesia Procedure Notes (Signed)
Procedure Name: LMA Insertion Date/Time: 12/25/2013 12:49 PM Performed by: Genevieve NorlanderLINKA, Sheriden Archibeque L Pre-anesthesia Checklist: Patient identified, Emergency Drugs available, Suction available and Patient being monitored Patient Re-evaluated:Patient Re-evaluated prior to inductionOxygen Delivery Method: Circle System Utilized Preoxygenation: Pre-oxygenation with 100% oxygen Intubation Type: IV induction Ventilation: Mask ventilation without difficulty LMA: LMA inserted LMA Size: 4.0 Number of attempts: 1 Airway Equipment and Method: bite block Placement Confirmation: positive ETCO2 and breath sounds checked- equal and bilateral Tube secured with: Tape Dental Injury: Teeth and Oropharynx as per pre-operative assessment

## 2013-12-25 NOTE — Anesthesia Preprocedure Evaluation (Signed)
Anesthesia Evaluation  Patient identified by MRN, date of birth, ID band Patient awake    Reviewed: Allergy & Precautions, H&P , NPO status , Patient's Chart, lab work & pertinent test results  Airway Mallampati: I TM Distance: >3 FB Neck ROM: Full    Dental  (+) Teeth Intact, Dental Advisory Given   Pulmonary  breath sounds clear to auscultation        Cardiovascular Rhythm:Regular Rate:Normal     Neuro/Psych    GI/Hepatic   Endo/Other    Renal/GU      Musculoskeletal   Abdominal   Peds  Hematology   Anesthesia Other Findings   Reproductive/Obstetrics                           Anesthesia Physical Anesthesia Plan  ASA: I  Anesthesia Plan: General   Post-op Pain Management:    Induction: Intravenous  Airway Management Planned: LMA  Additional Equipment:   Intra-op Plan:   Post-operative Plan: Extubation in OR  Informed Consent: I have reviewed the patients History and Physical, chart, labs and discussed the procedure including the risks, benefits and alternatives for the proposed anesthesia with the patient or authorized representative who has indicated his/her understanding and acceptance.   Dental advisory given  Plan Discussed with: CRNA, Anesthesiologist and Surgeon  Anesthesia Plan Comments:         Anesthesia Quick Evaluation  

## 2013-12-25 NOTE — Op Note (Signed)
[id number] 
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
## 2013-12-27 ENCOUNTER — Encounter (HOSPITAL_BASED_OUTPATIENT_CLINIC_OR_DEPARTMENT_OTHER): Payer: Self-pay | Admitting: Orthopedic Surgery

## 2013-12-27 NOTE — Addendum Note (Signed)
Addendum  created 12/27/13 [id number] by Lance CoonWesley Renay Crammer, CRNA   Modules edited: Charges VN

## 2013-12-28 ENCOUNTER — Encounter (HOSPITAL_BASED_OUTPATIENT_CLINIC_OR_DEPARTMENT_OTHER): Payer: Self-pay | Admitting: Orthopedic Surgery

## 2014-01-06 ENCOUNTER — Encounter (HOSPITAL_COMMUNITY): Payer: Self-pay | Admitting: *Deleted

## 2014-01-06 ENCOUNTER — Emergency Department (HOSPITAL_COMMUNITY): Payer: 59

## 2014-01-06 ENCOUNTER — Emergency Department (HOSPITAL_COMMUNITY)
Admission: EM | Admit: 2014-01-06 | Discharge: 2014-01-06 | Disposition: A | Discharge status: Home / Self Care | Payer: 59 | Attending: Emergency Medicine | Admitting: Emergency Medicine

## 2014-01-06 DIAGNOSIS — Z791 Long term (current) use of non-steroidal anti-inflammatories (NSAID): Secondary | ICD-10-CM | POA: Insufficient documentation

## 2014-01-06 DIAGNOSIS — Z8781 Personal history of (healed) traumatic fracture: Secondary | ICD-10-CM | POA: Insufficient documentation

## 2014-01-06 DIAGNOSIS — Y990 Civilian activity done for income or pay: Secondary | ICD-10-CM | POA: Diagnosis not present

## 2014-01-06 DIAGNOSIS — Y9389 Activity, other specified: Secondary | ICD-10-CM | POA: Insufficient documentation

## 2014-01-06 DIAGNOSIS — Z8669 Personal history of other diseases of the nervous system and sense organs: Secondary | ICD-10-CM | POA: Diagnosis not present

## 2014-01-06 DIAGNOSIS — Z862 Personal history of diseases of the blood and blood-forming organs and certain disorders involving the immune mechanism: Secondary | ICD-10-CM | POA: Insufficient documentation

## 2014-01-06 DIAGNOSIS — Z8614 Personal history of Methicillin resistant Staphylococcus aureus infection: Secondary | ICD-10-CM | POA: Diagnosis not present

## 2014-01-06 DIAGNOSIS — F419 Anxiety disorder, unspecified: Secondary | ICD-10-CM | POA: Insufficient documentation

## 2014-01-06 DIAGNOSIS — Z91040 Latex allergy status: Secondary | ICD-10-CM | POA: Diagnosis not present

## 2014-01-06 DIAGNOSIS — Y9259 Other trade areas as the place of occurrence of the external cause: Secondary | ICD-10-CM | POA: Insufficient documentation

## 2014-01-06 DIAGNOSIS — S6992XA Unspecified injury of left wrist, hand and finger(s), initial encounter: Secondary | ICD-10-CM | POA: Insufficient documentation

## 2014-01-06 DIAGNOSIS — Z79899 Other long term (current) drug therapy: Secondary | ICD-10-CM | POA: Insufficient documentation

## 2014-01-06 DIAGNOSIS — W2209XA Striking against other stationary object, initial encounter: Secondary | ICD-10-CM | POA: Diagnosis not present

## 2014-01-06 DIAGNOSIS — M79642 Pain in left hand: Secondary | ICD-10-CM

## 2014-01-06 MED ORDER — OXYCODONE-ACETAMINOPHEN 5-325 MG PO TABS: 1.0000 | ORAL_TABLET | ORAL | Status: DC | PRN

## 2014-01-06 MED ORDER — IBUPROFEN 400 MG PO TABS
800.0000 mg | ORAL_TABLET | Freq: Once | ORAL | Status: AC
  Administered 2014-01-06: 800 mg via ORAL
  Filled 2014-01-06: qty 2

## 2014-01-06 NOTE — ED Provider Notes (Signed)
CSN: [id number]     Arrival date & time 01/06/14  1635 History  This chart was scribed for non-physician practitioner working with Purvis SheffieldForrest Harrison, MD by Richarda Overlieichard Holland, ED Scribe. This patient was seen in room TR08C/TR08C and the patient's care was started at 5:10 PM.    Chief Complaint  Patient presents with  . Hand Pain   The history is provided by the patient. No language interpreter was used.   HPI Comments: Taylor Flowers is a 23 y.o. female who presents to the Emergency Department complaining of left hand pain that occurred yesterday after pt hit her hand. She complains of pain from the middle of her hand up her wrist and arm. Pt reports having hand surgery 12/7 by Dr. Merlyn LotKuzma, she had 3 pins placed and recovered well. Pt states she was at work yesterday and hit her hand on a cooler door when she turned around and hit her hand with the brace on. She says she has no more of her percocet medication but tried ibuprofen and and tylenol 3 for her pain which failed to provide relief. Pt states her left 4th and 5th fingers have been cold all day today. She says her next visit with  Dr. Merlyn LotKuzma is 12/28. Pt reports no pertinent past medical history at this time.   Past Medical History  Diagnosis Date  . Migraines   . History of anemia     no current med.  Marland Kitchen. Spinal headache   . Metacarpal bone fracture 12/2013    left small  . History of MRSA infection 2012    buttock  . Anxiety    Past Surgical History  Procedure Laterality Date  . Dilation and evacuation N/A 01/24/2013    Procedure: DILATATION AND EVACUATION;  Surgeon: Catalina AntiguaPeggy Constant, MD;  Location: [facility];  Service: Gynecology;  Laterality: N/A;  . Cervical cerclage N/A 06/13/2013    Procedure: CERCLAGE CERVICAL;  Surgeon: Kathreen CosierBernard A Marshall, MD;  Location: [facility];  Service: Gynecology;  Laterality: N/A;  . Dilation and curettage of uterus N/A 08/23/2013    Procedure: Dilatation and Currettage with Ultrasound Guidance;  Surgeon: Kathreen CosierBernard A  Marshall, MD;  Location: [facility];  Service: Gynecology;  Laterality: N/A;  . Dilation and curettage of uterus  06/2007  . Closed reduction metacarpal with percutaneous pinning Left 12/25/2013    Procedure: LEFT SMALL METACARPAL CLOSED REDUCTION PERCUTANEOUS PINNING ;  Surgeon: Betha LoaKevin Kuzma, MD;  Location: [facility];  Service: Orthopedics;  Laterality: Left;   Family History  Problem Relation Age of Onset  . Hypertension Mother   . Hypertension Father   . Diabetes Father   . Vision loss Father    History  Substance Use Topics  . Smoking status: Never Smoker   . Smokeless tobacco: Never Used  . Alcohol Use: No   OB History    Gravida Para Term Preterm AB TAB SAB Ectopic Multiple Living   3 1  1 2  1 1  1      Review of Systems  Musculoskeletal: Positive for arthralgias.  All other systems reviewed and are negative.   Allergies  Flagyl; Darvocet; and Latex  Home Medications   Prior to Admission medications   Medication Sig Start Date End Date Taking? Authorizing Provider  ALPRAZolam Prudy Feeler(XANAX) 0.5 MG tablet Take 0.5 mg by mouth at bedtime as needed for anxiety.    Historical Provider, MD  ibuprofen (ADVIL,MOTRIN) 800 MG tablet Take 800 mg by mouth every 8 (eight)  hours as needed for headache.    Historical Provider, MD  oxyCODONE-acetaminophen (PERCOCET) 5-325 MG per tablet 1-2 tabs po q6 hours prn pain 12/25/13   Betha Loa, MD  oxyCODONE-acetaminophen (PERCOCET/ROXICET) 5-325 MG per tablet Take 1 tablet by mouth every 4 (four) hours as needed for severe pain. May take 2 tablets PO q 6 hours for severe pain - Do not take with Tylenol as this tablet already contains tylenol 01/06/14   Trystin Terhune L Elira Colasanti, PA-C  sertraline (ZOLOFT) 25 MG tablet Take 25 mg by mouth daily as needed (depression).    Historical Provider, MD  zolpidem (AMBIEN) 10 MG tablet Take 10 mg by mouth at bedtime as needed for sleep.    Historical Provider, MD   BP 129/93 mmHg  Pulse 83  Temp(Src)  98 F (36.7 C) (Oral)  Resp 16  SpO2 98%  LMP 10/06/2013 Physical Exam  Constitutional: She is oriented to person, place, and time. She appears well-developed and well-nourished. No distress.  HENT:  Head: Normocephalic and atraumatic.  Right Ear: External ear normal.  Left Ear: External ear normal.  Nose: Nose normal.  Mouth/Throat: Oropharynx is clear and moist.  Eyes: Conjunctivae are normal.  Neck: Normal range of motion. Neck supple.  Cardiovascular: Normal rate.   Pulmonary/Chest: Effort normal.  Abdominal: Soft.  Musculoskeletal:       Left wrist: Normal.       Left hand: She exhibits decreased range of motion (Mildly decreased. ) and tenderness. She exhibits normal capillary refill, no deformity, no laceration and no swelling. Normal sensation noted.       Hands: 3 surgical pins noted. No erythema or drainage. No swelling. Right hand and wrist are normal.   Neurological: She is alert and oriented to person, place, and time.  Skin: Skin is warm and dry. She is not diaphoretic.  Psychiatric: She has a normal mood and affect.  Nursing note and vitals reviewed.   ED Course  Procedures   Medications  ibuprofen (ADVIL,MOTRIN) tablet 800 mg (800 mg Oral Given 01/06/14 1744)    DIAGNOSTIC STUDIES: Oxygen Saturation is 98% on RA, normal by my interpretation.    COORDINATION OF CARE: 5:15 PM Discussed treatment plan with pt at bedside and pt agreed to plan.   Labs Review Labs Reviewed - No data to display  Imaging Review Dg Wrist Complete Left  01/06/2014   CLINICAL DATA:  LEFT hand surgery, with pain in the wrist.  EXAM: LEFT WRIST - COMPLETE 3+ VIEW  COMPARISON:  12/17/2013.  Hand reported separately.  FINDINGS: K-wire fixation of the fourth and fifth metacarpals. Early healing of the fifth metacarpal fracture. No wrist injury is evident.  IMPRESSION: As above.   Electronically Signed   By: Davonna Belling M.D.   On: 01/06/2014 18:11   Dg Hand Complete  Left  01/06/2014   CLINICAL DATA:  Left hand pain.  EXAM: LEFT HAND - COMPLETE 3+ VIEW  COMPARISON:  None.  FINDINGS: There are K-wires spanning the left fifth metacarpal fracture and extending into the fourth metacarpal. No acute findings present.  IMPRESSION: Fixation of fifth metacarpal fracture with out complication.   Electronically Signed   By: Genevive Bi M.D.   On: 01/06/2014 18:11     EKG Interpretation None      Patient drove herself to the ED.   MDM   Final diagnoses:  Left hand pain   Filed Vitals:   01/06/14 1638  BP: 129/93  Pulse: 83  Temp:  98 F (36.7 C)  Resp: 16   Afebrile, NAD, non-toxic appearing, AAOx4.  Neurovascularly intact. Normal sensation. No evidence of compartment syndrome. No evidence of infection. X-ray reviewed. No acute changes or re-injuries. Fixation in place. Advised to keep follow up appointment with Dr. Merlyn LotKuzma. Return precautions discussed. Patient is agreeable to plan. Patient is stable at time of discharge    I personally performed the services described in this documentation, which was scribed in my presence. The recorded information has been reviewed and is accurate.       Jeannetta EllisJennifer L Ulyess Muto, PA-C 01/06/14 1852  Purvis SheffieldForrest Harrison, MD 01/07/14 1204

## 2014-01-06 NOTE — Discharge Instructions (Signed)
Please follow up with your primary care physician in 1-2 days. If you do not have one please [facility] number listed above. Please follow up with Dr. Merlyn LotKuzma to schedule a follow up appointment. Please take pain medication and/or muscle relaxants as prescribed and as needed for pain. Please do not drive on narcotic pain medication or on muscle relaxants. Please read all discharge instructions and return precautions.   Boxer's Fracture Boxer's fracture is a broken bone (fracture) of the fourth or fifth metacarpal (ring or pinky finger). The metacarpal bones connect the wrist to the fingers and make up the arch of the hand. Boxer's fracture occurs toward the body (proximal) from the first knuckle. This injury is known as a boxer's fracture, because it often occurs from hitting an object with a closed fist. SYMPTOMS   Severe pain at the time of injury.  Pain and swelling around the first knuckle on the fourth or fifth finger.  Bruising (contusion) in the area within 48 hours of injury.  Visible deformity, such as a pushed down knuckle. This can occur if the fracture is complete, and the bone fragments separate enough to distort normal body shape.  Numbness or paralysis from swelling in the hand, causing pressure on the blood vessels or nerves (uncommon). CAUSES   Direct injury (trauma), such as a striking blow with the fist.  Indirect stress to the hand, such as twisting or violent muscle contraction (uncommon). RISK INCREASES WITH:  Punching an object with an unprotected knuckle.  Contact sports (football, rugby).  Sports that require hitting (boxing, martial arts).  History of bone or joint disease (osteoporosis). PREVENTION  Maintain physical fitness:  Muscle strength and flexibility.  Endurance.  Cardiovascular fitness.  For participation in contact sports, wear proper protective equipment for the hand and make sure it fits properly.  Learn and  use proper technique when hitting or punching. PROGNOSIS  When proper treatment is given, to ensure normal alignment of the bones, healing can usually be expected in 4 to 6 weeks. Occasionally, surgery is necessary.  RELATED COMPLICATIONS   Bone does not heal back together (nonunion).  Bone heals together in an improper position (malunion), causing twisting of the finger when making a fist.  Chronic pain, stiffness, or swelling of the hand.  Excessive bleeding in the hand, causing pressure and injury to nerves and blood vessels (rare).  Stopping of normal hand growth in children.  Infection of the wound, if skin is broken over the fracture (open fracture), or at the incision site if surgery is performed.  Shortening of injured bones.  Bony bump (prominence) in the palm or loss of shape of the knuckles.  Pain and weakness when gripping.  Arthritis of the affected joint, if the fracture goes into the joint, after repeated injury, or after delayed treatment.  Scarring around the knuckle, and limited motion. TREATMENT  Treatment varies, depending on the injury. The place in the hand where the injury occurs has a great deal of motion, which allows the hand to move properly. If the fracture is not aligned properly, this function may be decreased. If the bone ends are in proper alignment, treatment first involves ice and elevation of the injured hand, at or above heart level, to reduce inflammation. Pain medicines help to relieve pain. Treatment also involves restraint by splinting, bandaging, casting, or bracing for 4 or more weeks.  If the fracture is out of alignment (displaced), or it involves the joint, surgery  is usually recommended. Surgery typically involves cutting through the skin to place removable pins, screws, and sometimes plates over the fracture. After surgery, the bone and joint are restrained for 4 or more weeks. After restraint (with or without surgery), stretching and  strengthening exercises are needed to regain proper strength and function of the joint. Exercises may be done at home or with the assistance of a therapist. Depending on the sport and position played, a brace or splint may be recommended when first returning to sports.  MEDICATION   If pain medication is necessary, nonsteroidal anti-inflammatory medications, such as aspirin and ibuprofen, or other minor pain relievers, such as acetaminophen, are often recommended.  Do not take pain medication for 7 days before surgery.  Prescription pain relievers may be necessary. Use only as directed and only as much as you need. COLD THERAPY Cold treatment (icing) relieves pain and reduces inflammation. Cold treatment should be applied for 10 to 15 minutes every 2 to 3 hours for inflammation and pain, and immediately after any activity that aggravates your symptoms. Use ice packs or an ice massage. SEEK MEDICAL CARE IF:   Pain, tenderness, or swelling gets worse, despite treatment.  You experience pain, numbness, or coldness in the hand.  Blue, gray, or dark color appears in the fingernails.  You develop signs of infection, after surgery (fever, increased pain, swelling, redness, drainage of fluids, or bleeding in the surgical area).  You feel you have reinjured the hand.  New, unexplained symptoms develop. (Drugs used in treatment may produce side effects.) Document Released: 01/05/2005 Document Revised: 03/30/2011 Document Reviewed: 04/19/2008 [facility] Patient Information 2015 [employer]. This information is not intended to replace advice given to you by your health care provider. Make sure you discuss any questions you have with your health care provider.

## 2014-01-06 NOTE — ED Notes (Signed)
Pt reports having hand surgery on 12/7 by dr Merlyn Lotkuzma, had pins placed and recovered well. Reports swinging her arm yesterday and hit her hand on object, now has pain to left palm and down into her wrist.

## 2014-02-14 ENCOUNTER — Inpatient Hospital Stay (HOSPITAL_COMMUNITY)
Admission: AD | Admit: 2014-02-14 | Discharge: 2014-02-14 | Disposition: A | Discharge status: Home / Self Care | Payer: Commercial Managed Care - HMO | Source: Ambulatory Visit | Attending: Obstetrics & Gynecology | Admitting: Obstetrics & Gynecology

## 2014-02-14 ENCOUNTER — Encounter (HOSPITAL_COMMUNITY): Payer: Self-pay

## 2014-02-14 DIAGNOSIS — Z3202 Encounter for pregnancy test, result negative: Secondary | ICD-10-CM | POA: Insufficient documentation

## 2014-02-14 DIAGNOSIS — M545 Low back pain: Secondary | ICD-10-CM | POA: Insufficient documentation

## 2014-02-14 DIAGNOSIS — R110 Nausea: Secondary | ICD-10-CM

## 2014-02-14 DIAGNOSIS — R112 Nausea with vomiting, unspecified: Secondary | ICD-10-CM | POA: Insufficient documentation

## 2014-02-14 LAB — URINALYSIS, ROUTINE W REFLEX MICROSCOPIC
Bilirubin Urine: NEGATIVE
Glucose, UA: NEGATIVE mg/dL
Hgb urine dipstick: NEGATIVE
KETONES UR: NEGATIVE mg/dL
Leukocytes, UA: NEGATIVE
Nitrite: NEGATIVE
Protein, ur: NEGATIVE mg/dL
Urobilinogen, UA: 0.2 mg/dL (ref 0.0–1.0)
pH: 6 (ref 5.0–8.0)

## 2014-02-14 LAB — HCG, QUANTITATIVE, PREGNANCY

## 2014-02-14 LAB — POCT PREGNANCY, URINE: Preg Test, Ur: NEGATIVE

## 2014-02-14 MED ORDER — BUTALBITAL-APAP-CAFFEINE 50-325-40 MG PO TABS: 1.0000 | ORAL_TABLET | Freq: Four times a day (QID) | ORAL | Status: DC | PRN

## 2014-02-14 NOTE — Discharge Instructions (Signed)

## 2014-02-14 NOTE — MAU Note (Signed)
Hx of incompetent cervix, sees Dr. April MansonYalcinkaya.

## 2014-02-14 NOTE — MAU Note (Signed)
Pt C/O lower back pain for the last 6-8 weeks, started having HA's, nausea, lower abd pain.  Last period was 1/14 but only lasted 3 days.  December period was only 4 days.  Negative HPT x 3 this week.

## 2014-02-14 NOTE — MAU Provider Note (Signed)
History     CSN: [id number]  Arrival date and time: 02/14/14 1100   First Provider Initiated Contact with Patient 02/14/14 1238      Chief Complaint  Patient presents with  . Back Pain  . Headache  . Nausea   The history is provided by the patient. No language interpreter was used.      [name] is a 24 y.o. female [phone] who presents with back pain, headache and nausea. She is also feeling lower abdominal stretching and she feels this is related to her being pregnant. She has taken several pregnancy test and all were negative.  She has not taken anything over the counter for her symptoms because she was afraid to take anything if she is pregnant.  She has a history of migraine HA's and in the past was taken fioricet. She has not been taken her medication because she thought she was pregnant and wasn't sure if this medication is safe in pregnancy.   The patient requests that I doppler her stomach to listen for fetal heart tones.   OB History    Gravida Para Term Preterm AB TAB SAB Ectopic Multiple Living   3 1  1 2  1 1  1       Past Medical History  Diagnosis Date  . Migraines   . History of anemia     no current med.  Marland Kitchen. Spinal headache   . Metacarpal bone fracture 12/2013    left small  . History of MRSA infection 2012    buttock  . Anxiety     Past Surgical History  Procedure Laterality Date  . Dilation and evacuation N/A 01/24/2013    Procedure: DILATATION AND EVACUATION;  Surgeon: Catalina AntiguaPeggy Constant, MD;  Location: [facility];  Service: Gynecology;  Laterality: N/A;  . Cervical cerclage N/A 06/13/2013    Procedure: CERCLAGE CERVICAL;  Surgeon: Kathreen CosierBernard A Marshall, MD;  Location: [facility];  Service: Gynecology;  Laterality: N/A;  . Dilation and curettage of uterus N/A 08/23/2013    Procedure: Dilatation and Currettage with Ultrasound Guidance;  Surgeon: Kathreen CosierBernard A Marshall, MD;  Location: [facility];  Service: Gynecology;  Laterality: N/A;  . Dilation and curettage of  uterus  06/2007  . Closed reduction metacarpal with percutaneous pinning Left 12/25/2013    Procedure: LEFT SMALL METACARPAL CLOSED REDUCTION PERCUTANEOUS PINNING ;  Surgeon: Betha LoaKevin Kuzma, MD;  Location: [facility];  Service: Orthopedics;  Laterality: Left;    Family History  Problem Relation Age of Onset  . Hypertension Mother   . Hypertension Father   . Diabetes Father   . Vision loss Father     History  Substance Use Topics  . Smoking status: Never Smoker   . Smokeless tobacco: Never Used  . Alcohol Use: No    Allergies:  Allergies  Allergen Reactions  . Flagyl [Metronidazole] Shortness Of Breath  . Darvocet [Propoxyphene N-Acetaminophen] Other (See Comments)    JOINT PAIN  . Latex Rash    Prescriptions prior to admission  Medication Sig Dispense Refill Last Dose  . ALPRAZolam (XANAX) 0.5 MG tablet Take 0.5 mg by mouth at bedtime as needed for anxiety.   Past Week at Unknown time  . ibuprofen (ADVIL,MOTRIN) 800 MG tablet Take 800 mg by mouth every 8 (eight) hours as needed for headache.   Past Week at Unknown time  . loratadine (CLARITIN) 10 MG tablet Take 10 mg by mouth daily.   02/12/2014  . sertraline (ZOLOFT)  25 MG tablet Take 25 mg by mouth daily as needed (depression).   Past Week at Unknown time  . zolpidem (AMBIEN) 10 MG tablet Take 10 mg by mouth at bedtime as needed for sleep.   Past Week at Unknown time  . oxyCODONE-acetaminophen (PERCOCET) 5-325 MG per tablet 1-2 tabs po q6 hours prn pain (Patient not taking: Reported on 02/14/2014) 40 tablet 0   . oxyCODONE-acetaminophen (PERCOCET/ROXICET) 5-325 MG per tablet Take 1 tablet by mouth every 4 (four) hours as needed for severe pain. May take 2 tablets PO q 6 hours for severe pain - Do not take with Tylenol as this tablet already contains tylenol (Patient not taking: Reported on 02/14/2014) 15 tablet 0    Results for orders placed or performed during the hospital encounter of 02/14/14 (from the past 48  hour(s))  Urinalysis, Routine w reflex microscopic     Status: Abnormal   Collection Time: 02/14/14 11:25 AM  Result Value Ref Range   Color, Urine YELLOW YELLOW   APPearance CLEAR CLEAR   Specific Gravity, Urine >1.030 (H) 1.005 - 1.030   pH 6.0 5.0 - 8.0   Glucose, UA NEGATIVE NEGATIVE mg/dL   Hgb urine dipstick NEGATIVE NEGATIVE   Bilirubin Urine NEGATIVE NEGATIVE   Ketones, ur NEGATIVE NEGATIVE mg/dL   Protein, ur NEGATIVE NEGATIVE mg/dL   Urobilinogen, UA 0.2 0.0 - 1.0 mg/dL   Nitrite NEGATIVE NEGATIVE   Leukocytes, UA NEGATIVE NEGATIVE    Comment: MICROSCOPIC NOT DONE ON URINES WITH NEGATIVE PROTEIN, BLOOD, LEUKOCYTES, NITRITE, OR GLUCOSE <1000 mg/dL.  Pregnancy, urine POC     Status: None   Collection Time: 02/14/14 11:46 AM  Result Value Ref Range   Preg Test, Ur NEGATIVE NEGATIVE    Comment:        THE SENSITIVITY OF THIS METHODOLOGY IS >24 mIU/mL   hCG, quantitative, pregnancy     Status: None   Collection Time: 02/14/14  1:05 PM  Result Value Ref Range   hCG, Beta Chain, Quant, S <1 <5 mIU/mL    Comment:          GEST. AGE      CONC.  (mIU/mL)   <=1 WEEK        5 - 50     2 WEEKS       50 - 500     3 WEEKS       100 - 10,000     4 WEEKS     1,000 - 30,000     5 WEEKS     3,500 - 115,000   6-8 WEEKS     12,000 - 270,000    12 WEEKS     15,000 - 220,000        FEMALE AND NON-PREGNANT FEMALE:     LESS THAN 5 mIU/mL REPEATED TO VERIFY    Review of Systems  Constitutional: Negative for fever and chills.  Gastrointestinal: Positive for nausea and vomiting.  Genitourinary: Negative for dysuria, urgency, frequency, hematuria and flank pain.  Musculoskeletal: Positive for back pain.   Physical Exam   Blood pressure 134/84, pulse 76, temperature 98.5 F (36.9 C), temperature source Oral, resp. rate 20, height 6' (1.829 m), weight 304 lb 12.8 oz (138.256 kg), last menstrual period 02/01/2014, not currently breastfeeding.  Physical Exam  Constitutional: She is  oriented to person, place, and time. Vital signs are normal. She appears well-developed and well-nourished.  Non-toxic appearance. She does not have a sickly appearance. She  does not appear ill. No distress.  HENT:  Head: Normocephalic.  Eyes: Pupils are equal, round, and reactive to light.  Neck: Neck supple.  Respiratory: Effort normal.  Musculoskeletal: Normal range of motion.  Neurological: She is alert and oriented to person, place, and time.  Skin: Skin is warm. She is not diaphoretic.  Psychiatric: Her behavior is normal.    MAU Course  Procedures  None  MDM UA Urine pregnancy test negative Beta hcg level negative   Assessment and Plan   A:  1. Nausea without vomiting   2. Encounter for pregnancy test with result negative    P:  Discharge home in stable condition Return to MAU for emergencies only At home pregnancy tests encouraged Follow up with PCP if symptoms worsen or continue Condoms always   Iona Hansen Rasch, NP 02/14/2014 3:15 PM   Attestation of Attending Supervision of Advanced Practitioner (CNM/NP): Evaluation and management procedures were performed by the Advanced Practitioner under my supervision and collaboration.  I have reviewed the Advanced Practitioner's note and chart, and I agree with the management and plan.  HARRAWAY-SMITH, Chane Magner 5:08 PM

## 2014-05-21 ENCOUNTER — Emergency Department (INDEPENDENT_AMBULATORY_CARE_PROVIDER_SITE_OTHER)
Admission: EM | Admit: 2014-05-21 | Discharge: 2014-05-21 | Disposition: A | Discharge status: Nursing Home (Includes ICF) | Payer: 59 | Source: Home / Self Care | Attending: Family Medicine | Admitting: Family Medicine

## 2014-05-21 ENCOUNTER — Emergency Department (HOSPITAL_COMMUNITY): Payer: 59

## 2014-05-21 ENCOUNTER — Ambulatory Visit (HOSPITAL_BASED_OUTPATIENT_CLINIC_OR_DEPARTMENT_OTHER): Payer: 59

## 2014-05-21 ENCOUNTER — Emergency Department (HOSPITAL_COMMUNITY)
Admission: EM | Admit: 2014-05-21 | Discharge: 2014-05-21 | Disposition: A | Discharge status: Home / Self Care | Payer: 59 | Attending: Emergency Medicine | Admitting: Emergency Medicine

## 2014-05-21 ENCOUNTER — Encounter (HOSPITAL_COMMUNITY): Payer: Self-pay | Admitting: Emergency Medicine

## 2014-05-21 ENCOUNTER — Encounter (HOSPITAL_COMMUNITY): Payer: Self-pay | Admitting: *Deleted

## 2014-05-21 DIAGNOSIS — M7989 Other specified soft tissue disorders: Secondary | ICD-10-CM | POA: Diagnosis not present

## 2014-05-21 DIAGNOSIS — F419 Anxiety disorder, unspecified: Secondary | ICD-10-CM | POA: Diagnosis not present

## 2014-05-21 DIAGNOSIS — Z91040 Latex allergy status: Secondary | ICD-10-CM | POA: Diagnosis not present

## 2014-05-21 DIAGNOSIS — I8390 Asymptomatic varicose veins of unspecified lower extremity: Secondary | ICD-10-CM

## 2014-05-21 DIAGNOSIS — M79605 Pain in left leg: Secondary | ICD-10-CM

## 2014-05-21 DIAGNOSIS — R002 Palpitations: Secondary | ICD-10-CM

## 2014-05-21 DIAGNOSIS — Z8669 Personal history of other diseases of the nervous system and sense organs: Secondary | ICD-10-CM | POA: Insufficient documentation

## 2014-05-21 DIAGNOSIS — M79602 Pain in left arm: Secondary | ICD-10-CM

## 2014-05-21 DIAGNOSIS — Z79899 Other long term (current) drug therapy: Secondary | ICD-10-CM | POA: Insufficient documentation

## 2014-05-21 DIAGNOSIS — Z862 Personal history of diseases of the blood and blood-forming organs and certain disorders involving the immune mechanism: Secondary | ICD-10-CM | POA: Insufficient documentation

## 2014-05-21 DIAGNOSIS — I8392 Asymptomatic varicose veins of left lower extremity: Secondary | ICD-10-CM | POA: Insufficient documentation

## 2014-05-21 DIAGNOSIS — Z8781 Personal history of (healed) traumatic fracture: Secondary | ICD-10-CM | POA: Insufficient documentation

## 2014-05-21 DIAGNOSIS — Z8679 Personal history of other diseases of the circulatory system: Secondary | ICD-10-CM | POA: Insufficient documentation

## 2014-05-21 DIAGNOSIS — Z8614 Personal history of Methicillin resistant Staphylococcus aureus infection: Secondary | ICD-10-CM | POA: Diagnosis not present

## 2014-05-21 LAB — BASIC METABOLIC PANEL
Anion gap: 8 (ref 5–15)
BUN: 10 mg/dL (ref 6–20)
CALCIUM: 8.9 mg/dL (ref 8.9–10.3)
CHLORIDE: 105 mmol/L (ref 101–111)
CO2: 24 mmol/L (ref 22–32)
Creatinine, Ser: 0.8 mg/dL (ref 0.44–1.00)
Glucose, Bld: 90 mg/dL (ref 70–99)
POTASSIUM: 3.7 mmol/L (ref 3.5–5.1)
SODIUM: 137 mmol/L (ref 135–145)

## 2014-05-21 LAB — CBC
HCT: 38.4 % (ref 36.0–46.0)
Hemoglobin: 12.6 g/dL (ref 12.0–15.0)
MCH: 27 pg (ref 26.0–34.0)
MCHC: 32.8 g/dL (ref 30.0–36.0)
MCV: 82.2 fL (ref 78.0–100.0)
Platelets: 321 10*3/uL (ref 150–400)
RBC: 4.67 MIL/uL (ref 3.87–5.11)
RDW: 14.6 % (ref 11.5–15.5)
WBC: 6.3 10*3/uL (ref 4.0–10.5)

## 2014-05-21 LAB — I-STAT BETA HCG BLOOD, ED (MC, WL, AP ONLY): I-stat hCG, quantitative: <5 m[IU]/mL (ref <5)

## 2014-05-21 LAB — I-STAT TROPONIN, ED: TROPONIN I, POC: 0 ng/mL (ref 0.00–0.08)

## 2014-05-21 NOTE — ED Provider Notes (Signed)
CSN: [id number]     Arrival date & time 05/21/14  1405 History   First MD Initiated Contact with Patient 05/21/14 1800     Chief Complaint  Patient presents with  . Leg Pain    Left behind knee pain and radiates up and down     (Consider location/radiation/quality/duration/timing/severity/associated sxs/prior Treatment) HPI Comments: Patient presents emergency department with chief complaint of leg pain. She was sent to ED by urgent care to rule out blood clot of her leg. She states that she has noticed some swelling and pain behind her knee for the past 5 days. She denies any recent travel or surgeries. Denies any prolonged immobilization. Denies any history of PE or DVT. She also reports having a history of anxiety, and states that she has had some intermittent palpitations which she is uncertain if it is related to her anxiety or to her Zoloft. She denies the symptoms currently. Denies any chest pain or shortness of breath. She has not taken anything to alleviate her symptoms. The leg pain is worsened with palpation. No mechanism of injury.  The history is provided by the patient. No language interpreter was used.    Past Medical History  Diagnosis Date  . Migraines   . History of anemia     no current med.  Marland Kitchen. Spinal headache   . Metacarpal bone fracture 12/2013    left small  . History of MRSA infection 2012    buttock  . Anxiety    Past Surgical History  Procedure Laterality Date  . Dilation and evacuation N/A 01/24/2013    Procedure: DILATATION AND EVACUATION;  Surgeon: Catalina AntiguaPeggy Constant, MD;  Location: [facility];  Service: Gynecology;  Laterality: N/A;  . Cervical cerclage N/A 06/13/2013    Procedure: CERCLAGE CERVICAL;  Surgeon: Kathreen CosierBernard A Marshall, MD;  Location: [facility];  Service: Gynecology;  Laterality: N/A;  . Dilation and curettage of uterus N/A 08/23/2013    Procedure: Dilatation and Currettage with Ultrasound Guidance;  Surgeon: Kathreen CosierBernard A Marshall, MD;  Location: [facility];   Service: Gynecology;  Laterality: N/A;  . Dilation and curettage of uterus  06/2007  . Closed reduction metacarpal with percutaneous pinning Left 12/25/2013    Procedure: LEFT SMALL METACARPAL CLOSED REDUCTION PERCUTANEOUS PINNING ;  Surgeon: Betha LoaKevin Kuzma, MD;  Location: [facility];  Service: Orthopedics;  Laterality: Left;   Family History  Problem Relation Age of Onset  . Hypertension Mother   . Hypertension Father   . Diabetes Father   . Vision loss Father    History  Substance Use Topics  . Smoking status: Never Smoker   . Smokeless tobacco: Never Used  . Alcohol Use: No   OB History    Gravida Para Term Preterm AB TAB SAB Ectopic Multiple Living   3 1  1 2  1 1  1      Review of Systems  Constitutional: Negative for fever and chills.  Respiratory: Negative for shortness of breath.   Cardiovascular: Positive for palpitations. Negative for chest pain.  Gastrointestinal: Negative for nausea, vomiting, diarrhea and constipation.  Genitourinary: Negative for dysuria.  Musculoskeletal: Positive for arthralgias.  All other systems reviewed and are negative.     Allergies  Flagyl; Darvocet; and Latex  Home Medications   Prior to Admission medications   Medication Sig Start Date End Date Taking? Authorizing Provider  diphenhydrAMINE (SOMINEX) 25 MG tablet Take 25 mg by mouth at bedtime as needed for sleep.   Yes Historical  Provider, MD  loratadine (CLARITIN) 10 MG tablet Take 10 mg by mouth daily.   Yes Historical Provider, MD  sertraline (ZOLOFT) 100 MG tablet Take 100 mg by mouth daily.   Yes Historical Provider, MD  zolpidem (AMBIEN) 5 MG tablet Take 5 mg by mouth at bedtime as needed for sleep.   Yes Historical Provider, MD  ALPRAZolam Prudy Feeler) 0.5 MG tablet Take 0.5 mg by mouth at bedtime as needed for anxiety.    Historical Provider, MD  butalbital-acetaminophen-caffeine (FIORICET) 50-325-40 MG per tablet Take 1-2 tablets by mouth every 6 (six) hours as  needed for headache. Patient not taking: Reported on 05/21/2014 02/14/14 02/14/15  Duane Lope, NP  ibuprofen (ADVIL,MOTRIN) 800 MG tablet Take 800 mg by mouth every 8 (eight) hours as needed for headache.    Historical Provider, MD  sertraline (ZOLOFT) 25 MG tablet Take 25 mg by mouth daily as needed (depression).    Historical Provider, MD  zolpidem (AMBIEN) 10 MG tablet Take 10 mg by mouth at bedtime as needed for sleep.    Historical Provider, MD   BP 137/88 mmHg  Pulse 74  Temp(Src) 98.5 F (36.9 C)  Resp 16  Ht 6' (1.829 m)  Wt 301 lb 6 oz (136.703 kg)  BMI 40.86 kg/m2  SpO2 100%  LMP 04/07/2014 Physical Exam  Constitutional: She is oriented to person, place, and time. She appears well-developed and well-nourished.  HENT:  Head: Normocephalic and atraumatic.  Eyes: Conjunctivae and EOM are normal. Pupils are equal, round, and reactive to light.  Neck: Normal range of motion. Neck supple.  Cardiovascular: Normal rate and regular rhythm.  Exam reveals no gallop and no friction rub.   No murmur heard. Pulmonary/Chest: Effort normal and breath sounds normal. No respiratory distress. She has no wheezes. She has no rales. She exhibits no tenderness.  Abdominal: Soft. Bowel sounds are normal. She exhibits no distension and no mass. There is no tenderness. There is no rebound and no guarding.  Musculoskeletal: Normal range of motion. She exhibits no edema or tenderness.  Left leg does not appear larger than right on my exam, mild left popliteal tenderness over a varicose vein, no erythema, no evidence of infection, no pitting edema, range of motion strength 5/5  Neurological: She is alert and oriented to person, place, and time.  Skin: Skin is warm and dry.  Psychiatric: She has a normal mood and affect. Her behavior is normal. Judgment and thought content normal.  Nursing note and vitals reviewed.   ED Course  Procedures (including critical care time) Results for orders placed or  performed during the hospital encounter of 05/21/14  CBC  Result Value Ref Range   WBC 6.3 4.0 - 10.5 K/uL   RBC 4.67 3.87 - 5.11 MIL/uL   Hemoglobin 12.6 12.0 - 15.0 g/dL   HCT 16.1 09.6 - 04.5 %   MCV 82.2 78.0 - 100.0 fL   MCH 27.0 26.0 - 34.0 pg   MCHC 32.8 30.0 - 36.0 g/dL   RDW 40.9 81.1 - 91.4 %   Platelets 321 150 - 400 K/uL  Basic metabolic panel  Result Value Ref Range   Sodium 137 135 - 145 mmol/L   Potassium 3.7 3.5 - 5.1 mmol/L   Chloride 105 101 - 111 mmol/L   CO2 24 22 - 32 mmol/L   Glucose, Bld 90 70 - 99 mg/dL   BUN 10 6 - 20 mg/dL   Creatinine, Ser 7.82 0.44 - 1.00 mg/dL  Calcium 8.9 8.9 - 10.3 mg/dL   GFR calc non Af Amer >60 >60 mL/min   GFR calc Af Amer >60 >60 mL/min   Anion gap 8 5 - 15  I-stat troponin, ED  (not at [facility], [facility])  Result Value Ref Range   Troponin i, poc 0.00 0.00 - 0.08 ng/mL   Comment 3          I-Stat Beta hCG blood, ED ([facility], [facility], [facility] only)  Result Value Ref Range   I-stat hCG, quantitative <5.0 <5 mIU/mL   Comment 3           Dg Chest 2 View  05/21/2014   CLINICAL DATA:  24 year old female with dizziness and left lower leg pain for the past 4 days.  EXAM: CHEST  2 VIEW  COMPARISON:  No priors.  FINDINGS: Lung volumes are normal. No consolidative airspace disease. No pleural effusions. No pneumothorax. No pulmonary nodule or mass noted. Pulmonary vasculature and the cardiomediastinal silhouette are within normal limits. Atherosclerosis in the thoracic aorta.  IMPRESSION: 1. No radiographic evidence of acute cardiopulmonary disease. 2. Atherosclerosis.   Electronically Signed   By: Trudie Reed M.D.   On: 05/21/2014 15:21     Imaging Review Dg Chest 2 View  05/21/2014   CLINICAL DATA:  24 year old female with dizziness and left lower leg pain for the past 4 days.  EXAM: CHEST  2 VIEW  COMPARISON:  No priors.  FINDINGS: Lung volumes are normal. No consolidative airspace disease. No pleural effusions. No pneumothorax. No pulmonary nodule  or mass noted. Pulmonary vasculature and the cardiomediastinal silhouette are within normal limits. Atherosclerosis in the thoracic aorta.  IMPRESSION: 1. No radiographic evidence of acute cardiopulmonary disease. 2. Atherosclerosis.   Electronically Signed   By: Trudie Reed M.D.   On: 05/21/2014 15:21     EKG Interpretation None       ED ECG REPORT  I personally interpreted this EKG   Date: 05/21/2014   Rate: 74  Rhythm: normal sinus rhythm  QRS Axis: normal  Intervals: normal  ST/T Wave abnormalities: normal  Conduction Disutrbances:none  Narrative Interpretation:   Old EKG Reviewed: none available    MDM   Final diagnoses:  Pain of left lower extremity  Varicose veins  Palpitations    Patient with left leg pain. Suspect this is secondary to varicose veins. DVT study is negative. Low suspicion for PE. PERC negative, I do not see any unilateral leg swelling. Regarding intermittent palpitations, EKG is normal here. Labs and troponin are negative for acute process. Would recommend follow-up with primary care and with cardiology. H&H is stable. Vitals are stable. Patient is stable and ready for discharge.    Roxy Horseman, PA-C 05/21/14 1821  Margarita Grizzle, MD 05/23/14 [phone]

## 2014-05-21 NOTE — Progress Notes (Signed)
Left lower extremity venous duplex completed.  Left:  No evidence of DVT, superficial thrombosis, or Baker's cyst.  Right:  Negative for DVT in the common femoral vein.  

## 2014-05-21 NOTE — ED Notes (Addendum)
PT was sent here from urgent care to r/o blood clot and get ultrasound to make sure. DPP present.  Pt reports chest pain, sob, dizziness, and headache that started 05/17/14.   No recent long travel.

## 2014-05-21 NOTE — ED Notes (Signed)
C/o left leg pain

## 2014-05-21 NOTE — ED Provider Notes (Signed)
Taylor Flowers is a 24 y.o. female who presents to Urgent Care today for left leg pain and swelling. Symptoms present last few days. This is associated with chest pain and intermittent shortness of breath and lightheadedness and dizziness. She notes swollen tender veins of the posterior aspect of her knee and calf. No radiating pain weakness or numbness. No fevers or chills. No history of DVT. No treatment tried yet.   Past Medical History  Diagnosis Date  . Migraines   . History of anemia     no current med.  Marland Kitchen. Spinal headache   . Metacarpal bone fracture 12/2013    left small  . History of MRSA infection 2012    buttock  . Anxiety    Past Surgical History  Procedure Laterality Date  . Dilation and evacuation N/A 01/24/2013    Procedure: DILATATION AND EVACUATION;  Surgeon: Catalina AntiguaPeggy Constant, MD;  Location: [facility];  Service: Gynecology;  Laterality: N/A;  . Cervical cerclage N/A 06/13/2013    Procedure: CERCLAGE CERVICAL;  Surgeon: Kathreen CosierBernard A Marshall, MD;  Location: [facility];  Service: Gynecology;  Laterality: N/A;  . Dilation and curettage of uterus N/A 08/23/2013    Procedure: Dilatation and Currettage with Ultrasound Guidance;  Surgeon: Kathreen CosierBernard A Marshall, MD;  Location: [facility];  Service: Gynecology;  Laterality: N/A;  . Dilation and curettage of uterus  06/2007  . Closed reduction metacarpal with percutaneous pinning Left 12/25/2013    Procedure: LEFT SMALL METACARPAL CLOSED REDUCTION PERCUTANEOUS PINNING ;  Surgeon: Betha LoaKevin Kuzma, MD;  Location: [facility];  Service: Orthopedics;  Laterality: Left;   History  Substance Use Topics  . Smoking status: Never Smoker   . Smokeless tobacco: Never Used  . Alcohol Use: No   ROS as above Medications: No current facility-administered medications for this encounter.   Current Outpatient Prescriptions  Medication Sig Dispense Refill  . ALPRAZolam (XANAX) 0.5 MG tablet Take 0.5 mg by mouth at bedtime as needed for anxiety.    .  butalbital-acetaminophen-caffeine (FIORICET) 50-325-40 MG per tablet Take 1-2 tablets by mouth every 6 (six) hours as needed for headache. 15 tablet 0  . ibuprofen (ADVIL,MOTRIN) 800 MG tablet Take 800 mg by mouth every 8 (eight) hours as needed for headache.    . loratadine (CLARITIN) 10 MG tablet Take 10 mg by mouth daily.    . sertraline (ZOLOFT) 25 MG tablet Take 25 mg by mouth daily as needed (depression).    Marland Kitchen. zolpidem (AMBIEN) 10 MG tablet Take 10 mg by mouth at bedtime as needed for sleep.     Allergies  Allergen Reactions  . Flagyl [Metronidazole] Shortness Of Breath  . Darvocet [Propoxyphene N-Acetaminophen] Other (See Comments)    JOINT PAIN  . Latex Rash     Exam:  BP 144/95 mmHg  Pulse 93  Temp(Src) 99.9 F (37.7 C) (Oral)  Resp 16  SpO2 98% Gen: Well NAD HEENT: EOMI,  MMM Lungs: Normal work of breathing. CTABL Heart: RRR no MRG Abd: NABS, Soft. Nondistended, Nontender Exts: Brisk capillary refill, warm and well perfused.  Left posterior knee dilated tender veins. Calf is mildly tender to squeeze and foot dorsiflexion. Pulses Dorsal sensation intact bilateral lower extremities.  No results found for this or any previous visit (from the past 24 hour(s)). No results found.  Assessment and Plan: 24 y.o. female with leg swelling. Concerning for DVT. Patient has either DVT or thrombophlebitis. Transfer to  ED for evaluation and management.  Discussed warning  signs or symptoms. Please see discharge instructions. Patient expresses understanding.     Rodolph Bong, MD 05/21/14 1357

## 2014-05-21 NOTE — ED Notes (Signed)
Declined W/C at D/C and was escorted to lobby by RN. 

## 2014-05-21 NOTE — Discharge Instructions (Signed)
Varicose Veins Varicose veins are veins that have become enlarged and twisted. CAUSES This condition is the result of valves in the veins not working properly. Valves in the veins help return blood from the leg to the heart. If these valves are damaged, blood flows backwards and backs up into the veins in the leg near the skin. This causes the veins to become larger. People who are on their feet a lot, who are pregnant, or who are overweight are more likely to develop varicose veins. SYMPTOMS   Bulging, twisted-appearing, bluish veins, most commonly found on the legs.  Leg pain or a feeling of heaviness. These symptoms may be worse at the end of the day.  Leg swelling.  Skin color changes. DIAGNOSIS  Varicose veins can usually be diagnosed with an exam of your legs by your caregiver. He or she may recommend an ultrasound of your leg veins. TREATMENT  Most varicose veins can be treated at home.However, other treatments are available for people who have persistent symptoms or who want to treat the cosmetic appearance of the varicose veins. These include:  Laser treatment of very small varicose veins.  Medicine that is shot (injected) into the vein. This medicine hardens the walls of the vein and closes off the vein. This treatment is called sclerotherapy. Afterwards, you may need to wear clothing or bandages that apply pressure.  Surgery. HOME CARE INSTRUCTIONS   Do not stand or sit in one position for long periods of time. Do not sit with your legs crossed. Rest with your legs raised during the day.  Wear elastic stockings or support hose. Do not wear other tight, encircling garments around the legs, pelvis, or waist.  Walk as much as possible to increase blood flow.  Raise the foot of your bed at night with 2-inch blocks.  If you get a cut in the skin over the vein and the vein bleeds, lie down with your leg raised and press on it with a clean cloth until the bleeding stops. Then  place a bandage (dressing) on the cut. See your caregiver if it continues to bleed or needs stitches. SEEK MEDICAL CARE IF:   The skin around your ankle starts to break down.  You have pain, redness, tenderness, or hard swelling developing in your leg over a vein.  You are uncomfortable due to leg pain. Document Released: 10/15/2004 Document Revised: 03/30/2011 Document Reviewed: 03/03/2010 [facility] Patient Information 2015 [place], [place]. This information is not intended to replace advice given to you by your health care provider. Make sure you discuss any questions you have with your health care provider. Palpitations A palpitation is the feeling that your heartbeat is irregular or is faster than normal. It may feel like your heart is fluttering or skipping a beat. Palpitations are usually not a serious problem. However, in some cases, you may need further medical evaluation. CAUSES  Palpitations can be caused by:  Smoking.  Caffeine or other stimulants, such as diet pills or energy drinks.  Alcohol.  Stress and anxiety.  Strenuous physical activity.  Fatigue.  Certain medicines.  Heart disease, especially if you have a history of irregular heart rhythms (arrhythmias), such as atrial fibrillation, atrial flutter, or supraventricular tachycardia.  An improperly working pacemaker or defibrillator. DIAGNOSIS  To find the cause of your palpitations, your health care provider will take your medical history and perform a physical exam. Your health care provider may also have you take a test called an ambulatory electrocardiogram (ECG).  An ECG records your heartbeat patterns over a 24-hour period. You may also have other tests, such as:  Transthoracic echocardiogram (TTE). During echocardiography, sound waves are used to evaluate how blood flows through your heart.  Transesophageal echocardiogram (TEE).  Cardiac monitoring. This allows your health care provider to monitor your  heart rate and rhythm in real time.  Holter monitor. This is a portable device that records your heartbeat and can help diagnose heart arrhythmias. It allows your health care provider to track your heart activity for several days, if needed.  Stress tests by exercise or by giving medicine that makes the heart beat faster. TREATMENT  Treatment of palpitations depends on the cause of your symptoms and can vary greatly. Most cases of palpitations do not require any treatment other than time, relaxation, and monitoring your symptoms. Other causes, such as atrial fibrillation, atrial flutter, or supraventricular tachycardia, usually require further treatment. HOME CARE INSTRUCTIONS   Avoid:  Caffeinated coffee, tea, soft drinks, diet pills, and energy drinks.  Chocolate.  Alcohol.  Stop smoking if you smoke.  Reduce your stress and anxiety. Things that can help you relax include:  A method of controlling things in your body, such as your heartbeats, with your mind (biofeedback).  Yoga.  Meditation.  Physical activity such as swimming, jogging, or walking.  Get plenty of rest and sleep. SEEK MEDICAL CARE IF:   You continue to have a fast or irregular heartbeat beyond 24 hours.  Your palpitations occur more often. SEEK IMMEDIATE MEDICAL CARE IF:  You have chest pain or shortness of breath.  You have a severe headache.  You feel dizzy or you faint. MAKE SURE YOU:  Understand these instructions.  Will watch your condition.  Will get help right away if you are not doing well or get worse. Document Released: 01/03/2000 Document Revised: 01/10/2013 Document Reviewed: 03/06/2011 [facility] Patient Information 2015 [place]. This information is not intended to replace advice given to you by your health care provider. Make sure you discuss any questions you have with your health care provider.

## 2014-08-20 DIAGNOSIS — O021 Missed abortion: Secondary | ICD-10-CM

## 2014-08-24 ENCOUNTER — Encounter (HOSPITAL_COMMUNITY): Payer: Self-pay | Admitting: *Deleted

## 2014-08-24 ENCOUNTER — Inpatient Hospital Stay (HOSPITAL_COMMUNITY): Payer: Commercial Managed Care - HMO

## 2014-08-24 ENCOUNTER — Inpatient Hospital Stay (HOSPITAL_COMMUNITY)
Admission: AD | Admit: 2014-08-24 | Discharge: 2014-08-25 | Disposition: A | Discharge status: Home / Self Care | Payer: Commercial Managed Care - HMO | Source: Ambulatory Visit | Attending: Obstetrics and Gynecology | Admitting: Obstetrics and Gynecology

## 2014-08-24 DIAGNOSIS — O26899 Other specified pregnancy related conditions, unspecified trimester: Secondary | ICD-10-CM

## 2014-08-24 DIAGNOSIS — Z3A08 8 weeks gestation of pregnancy: Secondary | ICD-10-CM | POA: Insufficient documentation

## 2014-08-24 DIAGNOSIS — O9989 Other specified diseases and conditions complicating pregnancy, childbirth and the puerperium: Secondary | ICD-10-CM | POA: Diagnosis not present

## 2014-08-24 DIAGNOSIS — R109 Unspecified abdominal pain: Secondary | ICD-10-CM | POA: Insufficient documentation

## 2014-08-24 DIAGNOSIS — M549 Dorsalgia, unspecified: Secondary | ICD-10-CM | POA: Diagnosis present

## 2014-08-24 DIAGNOSIS — O3680X0 Pregnancy with inconclusive fetal viability, not applicable or unspecified: Secondary | ICD-10-CM

## 2014-08-24 HISTORY — DX: Depression, unspecified: F32.A

## 2014-08-24 HISTORY — DX: Major depressive disorder, single episode, unspecified: F32.9

## 2014-08-24 LAB — CBC
HEMATOCRIT: 36.5 % (ref 36.0–46.0)
HEMOGLOBIN: 12.6 g/dL (ref 12.0–15.0)
MCH: 29.4 pg (ref 26.0–34.0)
MCHC: 34.5 g/dL (ref 30.0–36.0)
MCV: 85.3 fL (ref 78.0–100.0)
PLATELETS: 295 10*3/uL (ref 150–400)
RBC: 4.28 MIL/uL (ref 3.87–5.11)
RDW: 15.1 % (ref 11.5–15.5)
WBC: 11.2 10*3/uL — AB (ref 4.0–10.5)

## 2014-08-24 LAB — URINALYSIS, ROUTINE W REFLEX MICROSCOPIC
Bilirubin Urine: NEGATIVE
GLUCOSE, UA: NEGATIVE mg/dL
Hgb urine dipstick: NEGATIVE
Ketones, ur: NEGATIVE mg/dL
LEUKOCYTES UA: NEGATIVE
Nitrite: NEGATIVE
PH: 6 (ref 5.0–8.0)
PROTEIN: NEGATIVE mg/dL
Specific Gravity, Urine: >1.03 — ABNORMAL HIGH (ref 1.005–1.030)
UROBILINOGEN UA: 1 mg/dL (ref 0.0–1.0)

## 2014-08-24 NOTE — MAU Provider Note (Signed)
Chief Complaint: Back Pain and Dizziness   First Provider Initiated Contact with Patient 08/24/14 2300      SUBJECTIVE HPI: Taylor Flowers is a 24 y.o. G4P0120 at [redacted]w[redacted]d by unsure LMP who presents to maternity admissions reporting episode of dizziness and back pain today while at work. The dizziness has resolved but she continues to report mild dull lower back pain.  She also reports noticing mild lower abdominal cramping off and on today.  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  She had a normal period in May, then a much lighter than usual period in June so she is unsure of her dates.  She had positive pregnancy test in the office this week with ultrasound planned next week.   She denies vaginal bleeding, vaginal itching/burning, urinary symptoms, h/a, dizziness, n/v, or fever/chills.     Abdominal Pain This is a new problem. The current episode started today. The onset quality is sudden. The problem occurs intermittently. The problem has been gradually worsening. The pain is located in the LLQ and RLQ. The pain is mild. The quality of the pain is cramping. The abdominal pain radiates to the back. Associated symptoms include dysuria and frequency. Pertinent negatives include no constipation, diarrhea, fever, headaches, nausea or vomiting. The pain is aggravated by movement and certain positions. She has tried nothing for the symptoms.    Past Medical History  Diagnosis Date  . Migraines   . History of anemia     no current med.  Marland Kitchen Spinal headache   . Metacarpal bone fracture 12/2013    left small  . History of MRSA infection 2012    buttock  . Anxiety   . Depression    Past Surgical History  Procedure Laterality Date  . Dilation and evacuation N/A 01/24/2013    Procedure: DILATATION AND EVACUATION;  Surgeon: Catalina Antigua, MD;  Location: [facility];  Service: Gynecology;  Laterality: N/A;  . Cervical  cerclage N/A 06/13/2013    Procedure: CERCLAGE CERVICAL;  Surgeon: Kathreen Cosier, MD;  Location: [facility];  Service: Gynecology;  Laterality: N/A;  . Dilation and curettage of uterus N/A 08/23/2013    Procedure: Dilatation and Currettage with Ultrasound Guidance;  Surgeon: Kathreen Cosier, MD;  Location: [facility];  Service: Gynecology;  Laterality: N/A;  . Dilation and curettage of uterus  06/2007  . Closed reduction metacarpal with percutaneous pinning Left 12/25/2013    Procedure: LEFT SMALL METACARPAL CLOSED REDUCTION PERCUTANEOUS PINNING ;  Surgeon: Betha Loa, MD;  Location: [facility];  Service: Orthopedics;  Laterality: Left;   History   Social History  . Marital Status: Single    Spouse Name: N/A  . Number of Children: N/A  . Years of Education: N/A   Occupational History  . Not on file.   Social History Main Topics  . Smoking status: Never Smoker   . Smokeless tobacco: Never Used  . Alcohol Use: No  . Drug Use: Yes    Special: Marijuana     Comment: last used 07/20/2014  . Sexual Activity: Yes    Birth Control/ Protection: None   Other Topics Concern  . Not on file   Social History Narrative   No current facility-administered medications on file prior to encounter.   Current Outpatient Prescriptions on File Prior to Encounter  Medication Sig Dispense Refill  . ALPRAZolam (XANAX) 0.5 MG tablet Take  0.5 mg by mouth at bedtime as needed for anxiety.    . butalbital-acetaminophen-caffeine (FIORICET) 50-325-40 MG per tablet Take 1-2 tablets by mouth every 6 (six) hours as needed for headache. (Patient not taking: Reported on 05/21/2014) 15 tablet 0  . diphenhydrAMINE (SOMINEX) 25 MG tablet Take 25 mg by mouth at bedtime as needed for sleep.    Marland Kitchen ibuprofen (ADVIL,MOTRIN) 800 MG tablet Take 800 mg by mouth every 8 (eight) hours as needed for headache.    . loratadine (CLARITIN) 10 MG tablet Take 10 mg by mouth daily.    . sertraline (ZOLOFT) 100 MG tablet Take  100 mg by mouth daily.    . sertraline (ZOLOFT) 25 MG tablet Take 25 mg by mouth daily as needed (depression).    Marland Kitchen zolpidem (AMBIEN) 10 MG tablet Take 10 mg by mouth at bedtime as needed for sleep.    Marland Kitchen zolpidem (AMBIEN) 5 MG tablet Take 5 mg by mouth at bedtime as needed for sleep.     Allergies  Allergen Reactions  . Flagyl [Metronidazole] Shortness Of Breath  . Darvocet [Propoxyphene N-Acetaminophen] Other (See Comments)    JOINT PAIN  . Latex Rash    Review of Systems  Constitutional: Negative for fever, chills and malaise/fatigue.  Eyes: Negative for blurred vision.  Respiratory: Negative for cough and shortness of breath.   Cardiovascular: Negative for chest pain.  Gastrointestinal: Positive for abdominal pain. Negative for heartburn, nausea, vomiting, diarrhea and constipation.  Genitourinary: Positive for dysuria and frequency. Negative for urgency.  Musculoskeletal: Negative.   Neurological: Negative for dizziness and headaches.  Psychiatric/Behavioral: Negative for depression.    OBJECTIVE Blood pressure 137/83, pulse 80, temperature 98.6 F (37 C), resp. rate 16, height 5\' 11"  (1.803 m), weight 138.166 kg (304 lb 9.6 oz), last menstrual period 06/28/2014. GENERAL: Well-developed, well-nourished female in no acute distress.  EYES: normal sclera/conjunctiva; no lid-lag HENT: Atraumatic, normocephalic HEART: normal rate RESP: normal effort ABDOMEN: Soft, non-tender MUSCULOSKELETAL: Normal ROM EXTREMITIES: Nontender, no edema NEURO/PSYCH: Alert and oriented, appropriate affect  PELVIC EXAM: Cervix pink, visually closed, without lesion, scant white creamy discharge, vaginal walls and external genitalia normal Bimanual exam: Cervix 0/long/high, firm, anterior, neg CMT, uterus nontender, nonenlarged, adnexa without tenderness, enlargement, or mass   LAB RESULTS Results for orders placed or performed during the hospital encounter of 08/24/14 (from the past 24 hour(s))   Urinalysis, Routine w reflex microscopic (not at [facility])     Status: Abnormal   Collection Time: 08/24/14  9:55 PM  Result Value Ref Range   Color, Urine YELLOW YELLOW   APPearance CLEAR CLEAR   Specific Gravity, Urine >1.030 (H) 1.005 - 1.030   pH 6.0 5.0 - 8.0   Glucose, UA NEGATIVE NEGATIVE mg/dL   Hgb urine dipstick NEGATIVE NEGATIVE   Bilirubin Urine NEGATIVE NEGATIVE   Ketones, ur NEGATIVE NEGATIVE mg/dL   Protein, ur NEGATIVE NEGATIVE mg/dL   Urobilinogen, UA 1.0 0.0 - 1.0 mg/dL   Nitrite NEGATIVE NEGATIVE   Leukocytes, UA NEGATIVE NEGATIVE  CBC     Status: Abnormal   Collection Time: 08/24/14 11:15 PM  Result Value Ref Range   WBC 11.2 (H) 4.0 - 10.5 K/uL   RBC 4.28 3.87 - 5.11 MIL/uL   Hemoglobin 12.6 12.0 - 15.0 g/dL   HCT 16.1 09.6 - 04.5 %   MCV 85.3 78.0 - 100.0 fL   MCH 29.4 26.0 - 34.0 pg   MCHC 34.5 30.0 - 36.0 g/dL   RDW 15.1  11.5 - 15.5 %   Platelets 295 150 - 400 K/uL  hCG, quantitative, pregnancy     Status: Abnormal   Collection Time: 08/24/14 11:15 PM  Result Value Ref Range   hCG, Beta Chain, Quant, S 16302 (H) <5 mIU/mL  Wet prep, genital     Status: Abnormal   Collection Time: 08/25/14 12:15 AM  Result Value Ref Range   Yeast Wet Prep HPF POC NONE SEEN NONE SEEN   Trich, Wet Prep NONE SEEN NONE SEEN   Clue Cells Wet Prep HPF POC NONE SEEN NONE SEEN   WBC, Wet Prep HPF POC FEW (A) NONE SEEN       IMAGING US Ob Comp Less 14 Wks  08/25/2014   CLINICAL DATA:  Abdominal pain.  EXAM: OBSTETRIC <14 WK [place] AND TRANSVAGINAL OB US  TECHNIQUE: Both transabdominal and transvaginal ultrasound examinations were performed for complete evaluation of the gestation as well as the maternal uterus, adnexal regions, and pelvic cul-de-sac. Transvaginal technique was performed to assess early pregnancy.  COMPARISON:  None.  FINDINGS: Intrauterine gestational sac: Single  Yolk sac:  No  Embryo:  No  Cardiac Activity: No  MSD: 13.6  mm   6 w   1  d  Maternal uterus/adnexae:  Normal. Presumed 1.3 cm corpus luteal cyst on the left.  IMPRESSION: Probable early intrauterine gestational sac, but no yolk sac, fetal pole, or cardiac activity yet visualized. Recommend follow-up quantitative B-HCG levels and follow-up US in 14 days to confirm and assess viability. This recommendation follows SRU consensus guidelines: Diagnostic Criteria for Nonviable Pregnancy Early in the First Trimester. Malva Limes Med 2013; 130:8657-84.   Electronically Signed   By: Ellery Plunk M.D.   On: 08/25/2014 00:17   US Ob Transvaginal  08/25/2014   CLINICAL DATA:  Abdominal pain.  EXAM: OBSTETRIC <14 WK [place] AND TRANSVAGINAL OB US  TECHNIQUE: Both transabdominal and transvaginal ultrasound examinations were performed for complete evaluation of the gestation as well as the maternal uterus, adnexal regions, and pelvic cul-de-sac. Transvaginal technique was performed to assess early pregnancy.  COMPARISON:  None.  FINDINGS: Intrauterine gestational sac: Single  Yolk sac:  No  Embryo:  No  Cardiac Activity: No  MSD: 13.6  mm   6 w   1  d  Maternal uterus/adnexae: Normal. Presumed 1.3 cm corpus luteal cyst on the left.  IMPRESSION: Probable early intrauterine gestational sac, but no yolk sac, fetal pole, or cardiac activity yet visualized. Recommend follow-up quantitative B-HCG levels and follow-up US in 14 days to confirm and assess viability. This recommendation follows SRU consensus guidelines: Diagnostic Criteria for Nonviable Pregnancy Early in the First Trimester. Malva Limes Med 2013; 696:2952-84.   Electronically Signed   By: Ellery Plunk M.D.   On: 08/25/2014 00:17    ASSESSMENT 1. Pregnancy of unknown anatomic location   2. Abdominal pain affecting pregnancy     PLAN Consult Dr Mindi Slicker. Reviewed assessment, labs, imaging Discharge home with ectopic precautions   Follow-up Information    Follow up with [facility] [place] [facility]ERN[facility] AD[facility].   Why:  In 48 hours for repeat labs  or sooner as needed   Contact information:   [address] [id number] mc [place] [address] [phone]      Sharen Counter Certified Nurse-Midwife 08/25/2014  12:52 AM

## 2014-08-24 NOTE — MAU Note (Signed)
Last couple of days having lower back pain and lightheaded. Last yr lost 2 kids to incompetent cervix and had same symptoms. Unsure of LMP but office going by 06/28/14. Lost 23.5wker one yr ago today and 17wker Jan 2015. Denies bleeding and no d/c

## 2014-08-25 DIAGNOSIS — O9989 Other specified diseases and conditions complicating pregnancy, childbirth and the puerperium: Secondary | ICD-10-CM | POA: Diagnosis not present

## 2014-08-25 DIAGNOSIS — R109 Unspecified abdominal pain: Secondary | ICD-10-CM

## 2014-08-25 LAB — WET PREP, GENITAL
Clue Cells Wet Prep HPF POC: NONE SEEN
TRICH WET PREP: NONE SEEN
Yeast Wet Prep HPF POC: NONE SEEN

## 2014-08-25 LAB — HIV ANTIBODY (ROUTINE TESTING W REFLEX): HIV Screen 4th Generation wRfx: NONREACTIVE

## 2014-08-25 LAB — HCG, QUANTITATIVE, PREGNANCY: HCG, BETA CHAIN, QUANT, S: 16302 m[IU]/mL — AB (ref <5)

## 2014-08-25 NOTE — Discharge Instructions (Signed)

## 2014-08-26 ENCOUNTER — Inpatient Hospital Stay (HOSPITAL_COMMUNITY)
Admission: AD | Admit: 2014-08-26 | Discharge: 2014-08-27 | Disposition: A | Discharge status: Home / Self Care | Payer: Commercial Managed Care - HMO | Source: Ambulatory Visit | Attending: Obstetrics and Gynecology | Admitting: Obstetrics and Gynecology

## 2014-08-26 DIAGNOSIS — O9989 Other specified diseases and conditions complicating pregnancy, childbirth and the puerperium: Secondary | ICD-10-CM | POA: Diagnosis not present

## 2014-08-26 DIAGNOSIS — Z3A08 8 weeks gestation of pregnancy: Secondary | ICD-10-CM | POA: Diagnosis not present

## 2014-08-26 DIAGNOSIS — O3680X0 Pregnancy with inconclusive fetal viability, not applicable or unspecified: Secondary | ICD-10-CM

## 2014-08-26 NOTE — MAU Note (Signed)
Pt denies pain or bleeding. Returns today for repeat labs.

## 2014-08-27 DIAGNOSIS — O9989 Other specified diseases and conditions complicating pregnancy, childbirth and the puerperium: Secondary | ICD-10-CM | POA: Diagnosis not present

## 2014-08-27 DIAGNOSIS — Z3A08 8 weeks gestation of pregnancy: Secondary | ICD-10-CM

## 2014-08-27 DIAGNOSIS — R109 Unspecified abdominal pain: Secondary | ICD-10-CM | POA: Diagnosis not present

## 2014-08-27 LAB — GC/CHLAMYDIA PROBE AMP (~~LOC~~) NOT AT ARMC
CHLAMYDIA, DNA PROBE: NEGATIVE
Neisseria Gonorrhea: NEGATIVE

## 2014-08-27 LAB — HCG, QUANTITATIVE, PREGNANCY: HCG, BETA CHAIN, QUANT, S: 17557 m[IU]/mL — AB (ref <5)

## 2014-08-27 NOTE — MAU Provider Note (Signed)
Subjective:   Pt is a 24 y.o. [id number] here for follow-up BHCG.  Upon review of the records patient was first seen on 08/24/14 for dizziness, back pain, and lower abdominal cramping.   BHCG on that day was 14782.  Ultrasound showed probable early intrauterine gestational sac, but no yolk sac, or fetal pole.  MSD 13.6.  Wet prep was collected.  Results were neagive.   Pt discharged home.  Pt reports here today with no report of abdominal pain or bleeding.    Objective: Physical Exam  Filed Vitals:   08/26/14 2311  BP: 123/70  Pulse: 74  Temp: 98.7 F (37.1 C)  Resp: 15   Constitutional: She is oriented to person, place, and time. She appears well-developed and well-nourished. No distress.  Neck: Normal range of motion.  Pulmonary/Chest: Effort normal. No respiratory distress.  Musculoskeletal: Normal range of motion.  Neurological: She is alert and oriented to person, place, and time.  Skin: Skin is warm and dry.   Results for orders placed or performed during the hospital encounter of 08/26/14 (from the past 24 hour(s))  hCG, quantitative, pregnancy     Status: Abnormal   Collection Time: 08/26/14 10:55 PM  Result Value Ref Range   hCG, Beta Chain, Quant, S 17557 (H) <5 mIU/mL   0020 Called Dr. Mindi Slicker > Reviewed HPI/Exam/labs > ectopic precautions and keep follow-up in office for this week.  Assessment: 24 y.o. G4P0120 at [redacted]w[redacted]d wks Pregnancy Follow-up BHCG  Plan: Discharge to home Ectopic precautions Keep scheduled appt in office this week  Marlis Edelson, CNM

## 2014-09-11 ENCOUNTER — Ambulatory Visit (HOSPITAL_COMMUNITY): Payer: Commercial Managed Care - HMO | Admitting: Anesthesiology

## 2014-09-11 ENCOUNTER — Encounter (HOSPITAL_COMMUNITY)
Admission: RE | Disposition: A | Discharge status: Home / Self Care | Payer: Self-pay | Source: Ambulatory Visit | Attending: Obstetrics and Gynecology

## 2014-09-11 ENCOUNTER — Ambulatory Visit (HOSPITAL_COMMUNITY)
Admission: RE | Admit: 2014-09-11 | Discharge: 2014-09-11 | Disposition: A | Discharge status: Home / Self Care | Payer: Commercial Managed Care - HMO | Source: Ambulatory Visit | Attending: Obstetrics and Gynecology | Admitting: Obstetrics and Gynecology

## 2014-09-11 ENCOUNTER — Encounter (HOSPITAL_COMMUNITY): Payer: Self-pay

## 2014-09-11 DIAGNOSIS — Z3A01 Less than 8 weeks gestation of pregnancy: Secondary | ICD-10-CM | POA: Insufficient documentation

## 2014-09-11 DIAGNOSIS — Z6841 Body Mass Index (BMI) 40.0 and over, adult: Secondary | ICD-10-CM | POA: Insufficient documentation

## 2014-09-11 DIAGNOSIS — O021 Missed abortion: Secondary | ICD-10-CM | POA: Diagnosis not present

## 2014-09-11 HISTORY — PX: DILATION AND EVACUATION: SHX1459

## 2014-09-11 SURGERY — DILATION AND EVACUATION, UTERUS
Anesthesia: General

## 2014-09-11 MED ORDER — FENTANYL CITRATE (PF) 100 MCG/2ML IJ SOLN
25.0000 ug | INTRAMUSCULAR | Status: DC | PRN
  Administered 2014-09-11 (×2): 50 ug via INTRAVENOUS

## 2014-09-11 MED ORDER — ONDANSETRON HCL 4 MG/2ML IJ SOLN
INTRAMUSCULAR | Status: AC
  Administered 2014-09-11: 4 mg via INTRAVENOUS
  Filled 2014-09-11: qty 2

## 2014-09-11 MED ORDER — PROPOFOL 10 MG/ML IV BOLUS
INTRAVENOUS | Status: DC | PRN
  Administered 2014-09-11: 200 mg via INTRAVENOUS

## 2014-09-11 MED ORDER — LIDOCAINE HCL 2 % IJ SOLN
INTRAMUSCULAR | Status: AC
  Filled 2014-09-11: qty 20

## 2014-09-11 MED ORDER — PROPOFOL 10 MG/ML IV BOLUS
INTRAVENOUS | Status: AC
  Filled 2014-09-11: qty 20

## 2014-09-11 MED ORDER — DEXAMETHASONE SODIUM PHOSPHATE 4 MG/ML IJ SOLN
4.0000 mg | INTRAMUSCULAR | Status: AC
  Administered 2014-09-11: 4 mg via INTRAVENOUS

## 2014-09-11 MED ORDER — FENTANYL CITRATE (PF) 100 MCG/2ML IJ SOLN
INTRAMUSCULAR | Status: AC
  Filled 2014-09-11: qty 4

## 2014-09-11 MED ORDER — KETOROLAC TROMETHAMINE 30 MG/ML IJ SOLN
30.0000 mg | Freq: Once | INTRAMUSCULAR | Status: AC | PRN
  Administered 2014-09-11: 30 mg via INTRAVENOUS

## 2014-09-11 MED ORDER — MEPERIDINE HCL 25 MG/ML IJ SOLN: 6.2500 mg | INTRAMUSCULAR | Status: DC | PRN

## 2014-09-11 MED ORDER — ONDANSETRON HCL 4 MG/2ML IJ SOLN: 4.0000 mg | Freq: Once | INTRAMUSCULAR | Status: DC | PRN

## 2014-09-11 MED ORDER — FENTANYL CITRATE (PF) 100 MCG/2ML IJ SOLN
INTRAMUSCULAR | Status: AC
  Administered 2014-09-11: 50 ug via INTRAVENOUS
  Filled 2014-09-11: qty 2

## 2014-09-11 MED ORDER — PANTOPRAZOLE SODIUM 40 MG PO TBEC
DELAYED_RELEASE_TABLET | ORAL | Status: AC
Start: 2014-09-11 — End: 2014-09-11
  Administered 2014-09-11: 40 mg via ORAL
  Filled 2014-09-11: qty 1

## 2014-09-11 MED ORDER — PANTOPRAZOLE SODIUM 40 MG PO TBEC
40.0000 mg | DELAYED_RELEASE_TABLET | Freq: Once | ORAL | Status: AC
  Administered 2014-09-11: 40 mg via ORAL

## 2014-09-11 MED ORDER — FENTANYL CITRATE (PF) 100 MCG/2ML IJ SOLN
INTRAMUSCULAR | Status: DC | PRN
  Administered 2014-09-11: 100 ug via INTRAVENOUS

## 2014-09-11 MED ORDER — LIDOCAINE HCL (CARDIAC) 20 MG/ML IV SOLN
INTRAVENOUS | Status: DC | PRN
  Administered 2014-09-11: 100 mg via INTRAVENOUS

## 2014-09-11 MED ORDER — ONDANSETRON HCL 4 MG/2ML IJ SOLN
INTRAMUSCULAR | Status: AC
  Filled 2014-09-11: qty 2

## 2014-09-11 MED ORDER — SCOPOLAMINE 1 MG/3DAYS TD PT72
1.0000 | MEDICATED_PATCH | Freq: Once | TRANSDERMAL | Status: DC
  Administered 2014-09-11: 1.5 mg via TRANSDERMAL

## 2014-09-11 MED ORDER — DEXAMETHASONE SODIUM PHOSPHATE 4 MG/ML IJ SOLN
INTRAMUSCULAR | Status: AC
  Administered 2014-09-11: 4 mg via INTRAVENOUS
  Filled 2014-09-11: qty 1

## 2014-09-11 MED ORDER — LACTATED RINGERS IV SOLN
INTRAVENOUS | Status: DC
  Administered 2014-09-11 (×3): via INTRAVENOUS

## 2014-09-11 MED ORDER — LIDOCAINE HCL (CARDIAC) 20 MG/ML IV SOLN
INTRAVENOUS | Status: AC
  Filled 2014-09-11: qty 5

## 2014-09-11 MED ORDER — MIDAZOLAM HCL 2 MG/2ML IJ SOLN
INTRAMUSCULAR | Status: DC | PRN
  Administered 2014-09-11: 2 mg via INTRAVENOUS

## 2014-09-11 MED ORDER — ONDANSETRON HCL 4 MG/2ML IJ SOLN
4.0000 mg | Freq: Once | INTRAMUSCULAR | Status: AC
  Administered 2014-09-11: 4 mg via INTRAVENOUS

## 2014-09-11 MED ORDER — SCOPOLAMINE 1 MG/3DAYS TD PT72
MEDICATED_PATCH | TRANSDERMAL | Status: AC
  Administered 2014-09-11: 1.5 mg via TRANSDERMAL
  Filled 2014-09-11: qty 1

## 2014-09-11 MED ORDER — KETOROLAC TROMETHAMINE 30 MG/ML IJ SOLN
INTRAMUSCULAR | Status: AC
  Administered 2014-09-11: 30 mg via INTRAVENOUS
  Filled 2014-09-11: qty 1

## 2014-09-11 MED ORDER — LIDOCAINE HCL 2 % IJ SOLN
INTRAMUSCULAR | Status: DC | PRN
  Administered 2014-09-11: 16 mL

## 2014-09-11 MED ORDER — MIDAZOLAM HCL 2 MG/2ML IJ SOLN
INTRAMUSCULAR | Status: AC
  Filled 2014-09-11: qty 4

## 2014-09-11 SURGICAL SUPPLY — 18 items
CATH ROBINSON RED A/P 16FR (CATHETERS) ×3 IMPLANT
CATH SILICONE 16FRX5CC (CATHETERS) ×3 IMPLANT
CLOTH BEACON ORANGE TIMEOUT ST (SAFETY) ×3 IMPLANT
DECANTER SPIKE VIAL GLASS SM (MISCELLANEOUS) ×3 IMPLANT
GLOVE BIO SURGEON STRL SZ8 (GLOVE) ×3 IMPLANT
GLOVE ORTHO TXT STRL SZ7.5 (GLOVE) ×3 IMPLANT
GOWN STRL REUS W/TWL LRG LVL3 (GOWN DISPOSABLE) ×6 IMPLANT
KIT BERKELEY 1ST TRIMESTER 3/8 (MISCELLANEOUS) ×3 IMPLANT
NS IRRIG 1000ML POUR BTL (IV SOLUTION) ×3 IMPLANT
PACK VAGINAL MINOR WOMEN LF (CUSTOM PROCEDURE TRAY) ×3 IMPLANT
PAD OB MATERNITY 4.3X12.25 (PERSONAL CARE ITEMS) ×3 IMPLANT
PAD PREP 24X48 CUFFED NSTRL (MISCELLANEOUS) ×3 IMPLANT
SET BERKELEY SUCTION TUBING (SUCTIONS) ×3 IMPLANT
TOWEL OR 17X24 6PK STRL BLUE (TOWEL DISPOSABLE) ×6 IMPLANT
VACURETTE 10 RIGID CVD (CANNULA) IMPLANT
VACURETTE 7MM CVD STRL WRAP (CANNULA) ×3 IMPLANT
VACURETTE 8 RIGID CVD (CANNULA) IMPLANT
VACURETTE 9 RIGID CVD (CANNULA) IMPLANT

## 2014-09-11 NOTE — Discharge Instructions (Signed)
Routine instructions for D&EDISCHARGE INSTRUCTIONS: D&C / D&E °The following instructions have been prepared to help you care for yourself upon your return home. °  °Personal hygiene: °• Use sanitary pads for vaginal drainage, not tampons. °• Shower the day after your procedure. °• NO tub baths, pools or Jacuzzis for 2-3 weeks. °• Wipe front to back after using the bathroom. ° °Activity and limitations: °• Do NOT drive or operate any equipment for 24 hours. The effects of anesthesia are still present and drowsiness may result. °• Do NOT rest in bed all day. °• Walking is encouraged. °• Walk up and down stairs slowly. °• You may resume your normal activity in one to two days or as indicated by your physician. ° °Sexual activity: NO intercourse for at least 2 weeks after the procedure, or as indicated by your physician. ° °Diet: Eat a light meal as desired this evening. You may resume your usual diet tomorrow. ° °Return to work: You may resume your work activities in one to two days or as indicated by your doctor. ° °What to expect after your surgery: Expect to have vaginal bleeding/discharge for 2-3 days and spotting for up to 10 days. It is not unusual to have soreness for up to 1-2 weeks. You may have a slight burning sensation when you urinate for the first day. Mild cramps may continue for a couple of days. You may have a regular period in 2-6 weeks. ° °Call your doctor for any of the following: °• Excessive vaginal bleeding, saturating and changing one pad every hour. °• Inability to urinate 6 hours after discharge from hospital. °• Pain not relieved by pain medication. °• Fever of 100.4° F or greater. °• Unusual vaginal discharge or odor. ° ° Call for an appointment:  ° ° °Patient’s signature: ______________________ ° °Nurse’s signature ________________________ ° °Support person's signature_______________________ ° ° ° °Post Anesthesia Home Care Instructions ° °Activity: °Get plenty of rest for the remainder of  the day. A responsible adult should stay with you for 24 hours following the procedure.  °For the next 24 hours, DO NOT: °-Drive a car °-Operate machinery °-Drink alcoholic beverages °-Take any medication unless instructed by your physician °-Make any legal decisions or sign important papers. ° °Meals: °Start with liquid foods such as gelatin or soup. Progress to regular foods as tolerated. Avoid greasy, spicy, heavy foods. If nausea and/or vomiting occur, drink only clear liquids until the nausea and/or vomiting subsides. Call your physician if vomiting continues. ° °Special Instructions/Symptoms: °Your throat may feel dry or sore from the anesthesia or the breathing tube placed in your throat during surgery. If this causes discomfort, gargle with warm salt water. The discomfort should disappear within 24 hours. ° °If you had a scopolamine patch placed behind your ear for the management of post- operative nausea and/or vomiting: ° °1. The medication in the patch is effective for 72 hours, after which it should be removed.  Wrap patch in a tissue and discard in the trash. Wash hands thoroughly with soap and water. °2. You may remove the patch earlier than 72 hours if you experience unpleasant side effects which may include dry mouth, dizziness or visual disturbances. °3. Avoid touching the patch. Wash your hands with soap and water after contact with the patch. °  ° °

## 2014-09-11 NOTE — Interval H&P Note (Signed)
History and Physical Interval Note:  09/11/2014 12:07 PM  Taylor Flowers  has presented today for surgery, with the diagnosis of Missed AB  The various methods of treatment have been discussed with the patient and family. After consideration of risks, benefits and other options for treatment, the patient has consented to  Procedure(s): DILATATION AND EVACUATION (N/A) as a surgical intervention .  The patient's history has been reviewed, patient examined, no change in status, stable for surgery.  I have reviewed the patient's chart and labs.  Questions were answered to the patient's satisfaction.     Hurman Ketelsen D

## 2014-09-11 NOTE — H&P (Signed)
Taylor Flowers is an 24 y.o. female. She was seen for new OB w/u on 8-11. u/s at [facility] on 8-6 with [redacted]W[redacted]D IUP by GS measurement, no yolk sac or fetal pole, HCG 16302 on 8-5, 17557 on 8-7. u/s 8-11 with no change from u/s on 8-6. No bleeding or pain.  She initially elected to try cytotec, but has changed her mind and wishes to proceed with D&E.  Pertinent Gynecological History: Last pap: normal Date: 2015 OB History: G4, P0130   Menstrual History: Patient's last menstrual period was 06/28/2014.    Past Medical History  Diagnosis Date  . Migraines   . History of anemia     no current med.  Marland Kitchen Spinal headache   . Metacarpal bone fracture 12/2013    left small  . History of MRSA infection 2012    buttock  . Anxiety   . Depression     Past Surgical History  Procedure Laterality Date  . Dilation and evacuation N/A 01/24/2013    Procedure: DILATATION AND EVACUATION;  Surgeon: Catalina Antigua, MD;  Location: [facility];  Service: Gynecology;  Laterality: N/A;  . Cervical cerclage N/A 06/13/2013    Procedure: CERCLAGE CERVICAL;  Surgeon: Kathreen Cosier, MD;  Location: [facility];  Service: Gynecology;  Laterality: N/A;  . Dilation and curettage of uterus N/A 08/23/2013    Procedure: Dilatation and Currettage with Ultrasound Guidance;  Surgeon: Kathreen Cosier, MD;  Location: [facility];  Service: Gynecology;  Laterality: N/A;  . Dilation and curettage of uterus  06/2007  . Closed reduction metacarpal with percutaneous pinning Left 12/25/2013    Procedure: LEFT SMALL METACARPAL CLOSED REDUCTION PERCUTANEOUS PINNING ;  Surgeon: Betha Loa, MD;  Location: [facility];  Service: Orthopedics;  Laterality: Left;    Family History  Problem Relation Age of Onset  . Hypertension Mother   . Hypertension Father   . Diabetes Father   . Vision loss Father     Social History:  reports that she has never smoked. She has never used smokeless tobacco. She reports that she uses illicit drugs  (Marijuana). She reports that she does not drink alcohol.  Allergies:  Allergies  Allergen Reactions  . Flagyl [Metronidazole] Shortness Of Breath  . Darvocet [Propoxyphene N-Acetaminophen] Other (See Comments)    JOINT PAIN  . Latex Rash    No prescriptions prior to admission    Review of Systems  Respiratory: Negative.   Cardiovascular: Negative.     Last menstrual period 06/28/2014. Physical Exam  Constitutional: She appears well-developed and well-nourished.  Cardiovascular: Normal rate, regular rhythm and normal heart sounds.   No murmur heard. Respiratory: Effort normal and breath sounds normal. No respiratory distress. She has no wheezes.  GI: Soft. She exhibits no distension and no mass. There is no tenderness.  Genitourinary: Vagina normal and uterus normal.  Cervix closed No adnexal mass    No results found for this or any previous visit (from the past 24 hour(s)).  No results found.  Assessment/Plan: Missed abortion.  Medical and surgical options discussed, she wishes to proceed with D&E.  Procedure and risks will be reviewed prior to the procedure.    Ja Pistole D 09/11/2014, 8:30 AM

## 2014-09-11 NOTE — Anesthesia Preprocedure Evaluation (Signed)
Anesthesia Evaluation  Patient identified by MRN, date of birth, ID band Patient awake    Reviewed: Allergy & Precautions, H&P , NPO status , Patient's Chart, lab work & pertinent test results  Airway Mallampati: I  TM Distance: >3 FB Neck ROM: full    Dental no notable dental hx. (+) Teeth Intact   Pulmonary neg pulmonary ROS,  breath sounds clear to auscultation  Pulmonary exam normal       Cardiovascular negative cardio ROS Normal cardiovascular exam    Neuro/Psych    GI/Hepatic negative GI ROS, Neg liver ROS,   Endo/Other  Morbid obesity  Renal/GU negative Renal ROS     Musculoskeletal   Abdominal (+) + obese,   Peds  Hematology negative hematology ROS (+)   Anesthesia Other Findings   Reproductive/Obstetrics negative OB ROS                             Anesthesia Physical Anesthesia Plan  ASA: III  Anesthesia Plan: General   Post-op Pain Management:    Induction: Intravenous  Airway Management Planned: LMA  Additional Equipment:   Intra-op Plan:   Post-operative Plan:   Informed Consent: I have reviewed the patients History and Physical, chart, labs and discussed the procedure including the risks, benefits and alternatives for the proposed anesthesia with the patient or authorized representative who has indicated his/her understanding and acceptance.     Plan Discussed with: CRNA and Surgeon  Anesthesia Plan Comments:         Anesthesia Quick Evaluation

## 2014-09-11 NOTE — Op Note (Addendum)
  Preoperative Diagnosis:  Missed abortion Postop Diagnosis:  Same Procedure:  D&E Anesthesia:  Gen with LMA, paracervical block Findings: Cervix was closed, 3 cm long, uterus was normal size, moderate products of conception were obtained Specimens: Products of conception sent for routine pathology Estimated blood loss: Minimal Complications: None  Procedure in detail: The patient was taken to the operating room and placed in the dorsosupine position. General anesthesia was induced and she was placed in mobile stirrups. Perineum and vagina were prepped and draped in the usual sterile fashion, bladder drained with a red Robinson catheter. A Graves speculum was inserted in the vagina. The anterior lip of the cervix was grasped with a single-tooth tenaculum. Paracervical block was then performed with a total of 16 cc of 2% plain lidocaine. Uterus then sounded to 9 cm. Cervix was gradually easily dilated to size 25 dilator. A size 7 curved suction curet was then inserted without difficulty. Suction curettage was return was performed with return of moderate products of conception. Sharp curettage was performed with which revealed good uterine cry in all quadrants and no significant tissue. Suction curettage was performed one more time which revealed minimal blood. The single-tooth tenaculum was removed from the cervix and bleeding was controlled with pressure. All instruments were removed from the vagina. The patient was taken to the recovery in stable condition after tolerating the procedure well. Counts were correct, she had PAS hose on throughout the procedure.

## 2014-09-11 NOTE — Anesthesia Postprocedure Evaluation (Signed)
Anesthesia Post Note  Patient: Taylor Flowers  Procedure(s) Performed: Procedure(s) (LRB): DILATATION AND EVACUATION (N/A)  Anesthesia type: General  Patient location: PACU  Post pain: Pain level controlled  Post assessment: Post-op Vital signs reviewed  Last Vitals:  Filed Vitals:   09/11/14 1345  BP: 123/75  Pulse: 80  Temp:   Resp: 14    Post vital signs: Reviewed  Level of consciousness: sedated  Complications: No apparent anesthesia complications

## 2014-09-11 NOTE — Anesthesia Procedure Notes (Signed)
Procedure Name: LMA Insertion Date/Time: 09/11/2014 12:34 PM Performed by: Collier Flowers Pre-anesthesia Checklist: Patient identified, Emergency Drugs available, Suction available, Patient being monitored and Timeout performed Patient Re-evaluated:Patient Re-evaluated prior to inductionOxygen Delivery Method: Circle system utilized Preoxygenation: Pre-oxygenation with 100% oxygen Intubation Type: IV induction Ventilation: Mask ventilation without difficulty LMA: LMA inserted LMA Size: 4.0 Tube size: 4.0 mm Number of attempts: 1 Placement Confirmation: positive ETCO2 and breath sounds checked- equal and bilateral Dental Injury: Teeth and Oropharynx as per pre-operative assessment

## 2014-09-11 NOTE — Transfer of Care (Signed)
Immediate Anesthesia Transfer of Care Note  Patient: Taylor Flowers  Procedure(s) Performed: Procedure(s): DILATATION AND EVACUATION (N/A)  Patient Location: PACU  Anesthesia Type:General  Level of Consciousness: awake, alert , oriented and patient cooperative  Airway & Oxygen Therapy: Patient Spontanous Breathing and Patient connected to nasal cannula oxygen  Post-op Assessment: Report given to RN and Post -op Vital signs reviewed and stable  Post vital signs: Reviewed and stable  Last Vitals:  TEMP 98.3 HR 80 BP 126/73 RR 13 SAT 100  Complications: No apparent anesthesia complications

## 2014-09-12 ENCOUNTER — Encounter (HOSPITAL_COMMUNITY): Payer: Self-pay | Admitting: Obstetrics and Gynecology

## 2014-10-30 IMAGING — US US PELVIS COMPLETE
1 series · 14 of 25 positions shown · non-contrast
Comparison: None

CLINICAL DATA: Vaginal bleeding after. Seventeen week fetal loss
and dilation and curettage.



[Series 1: us pelvis complete · 14 of 63 slices shown]
[im 1/63]
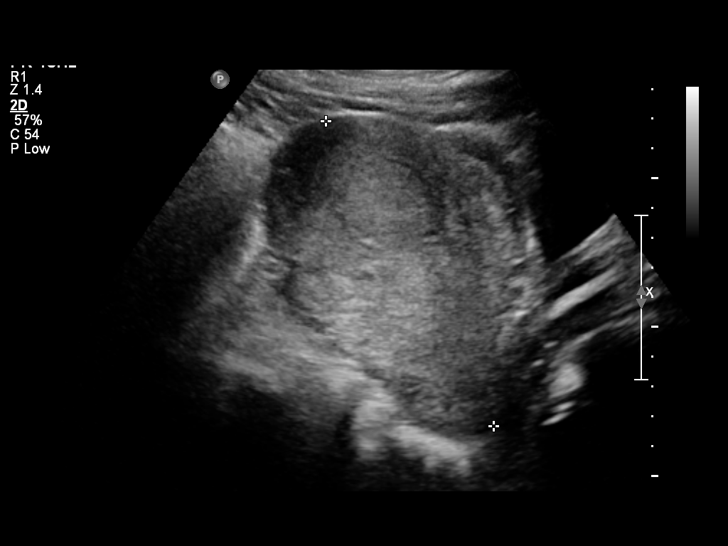
[im 6/63]
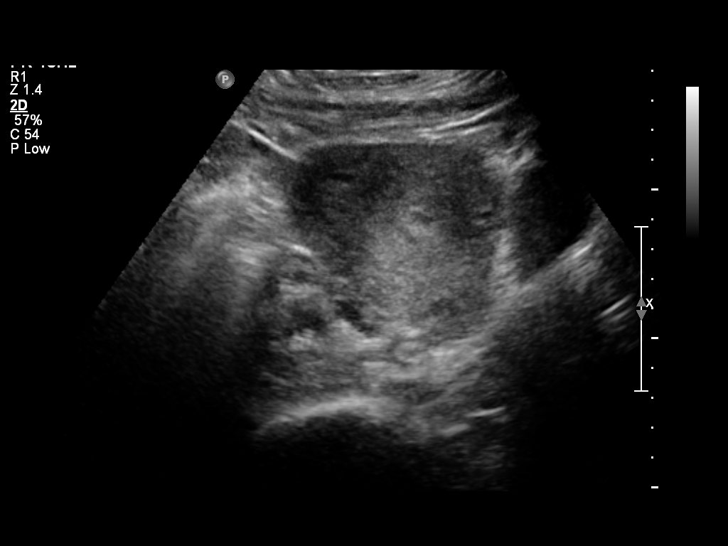
[im 11/63]
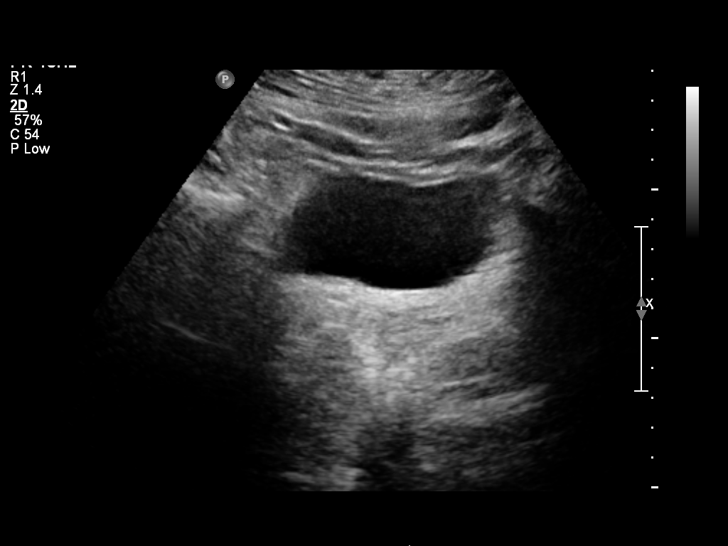
[im 16/63]
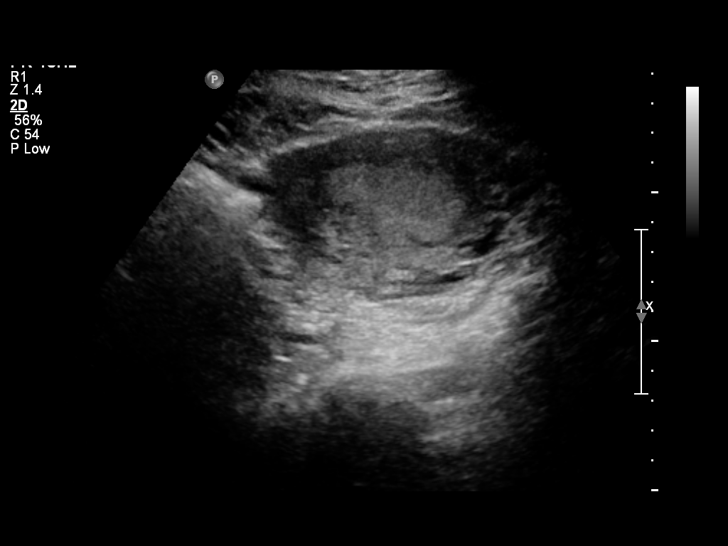
[im 21/63]
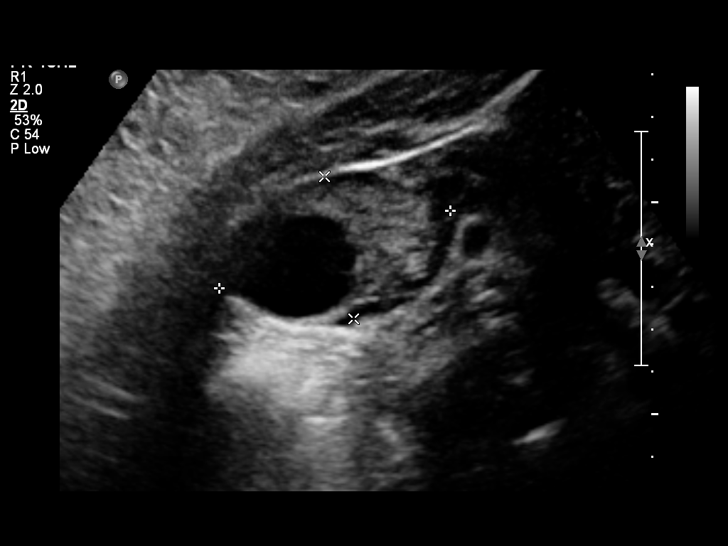
[im 24/63]
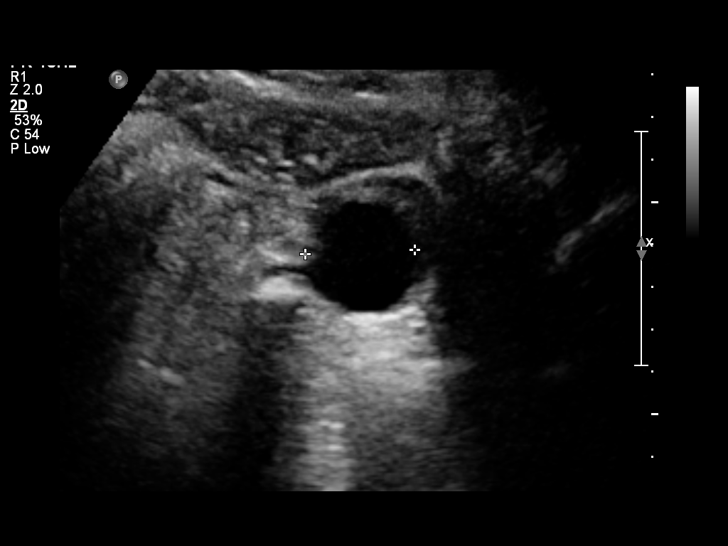
[im 29/63]
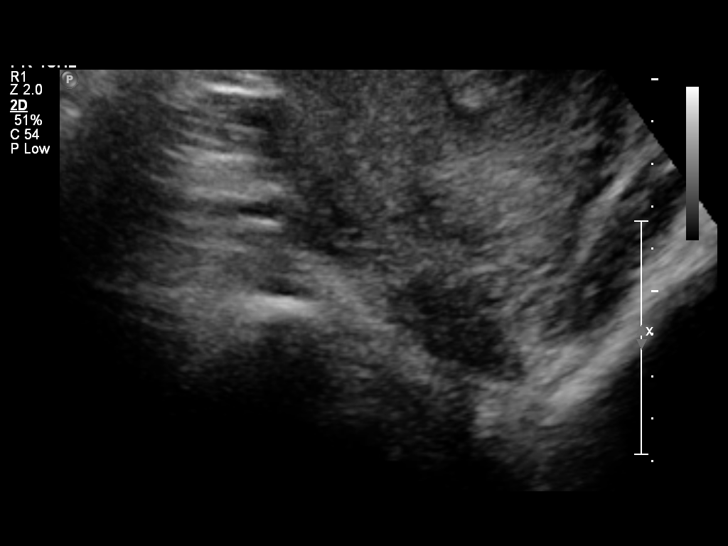
[im 34/63]
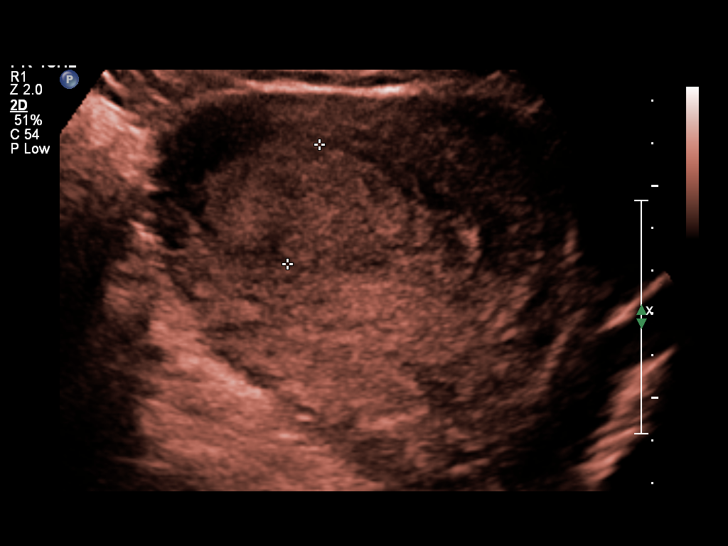
[im 39/63]
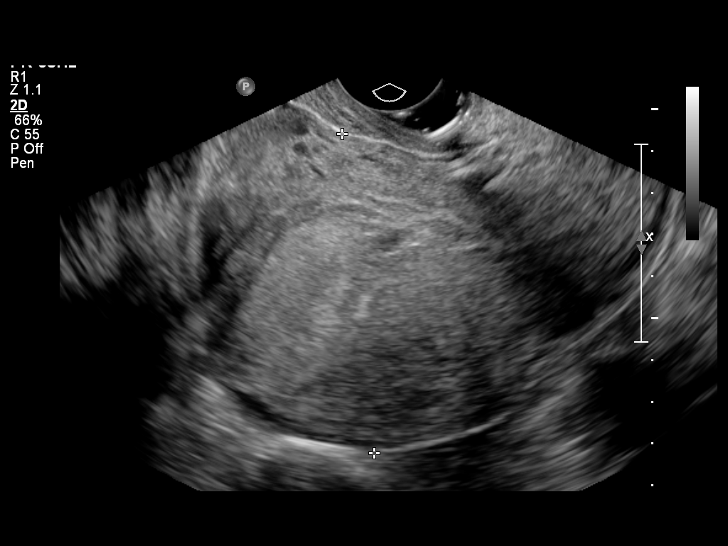
[im 42/63]
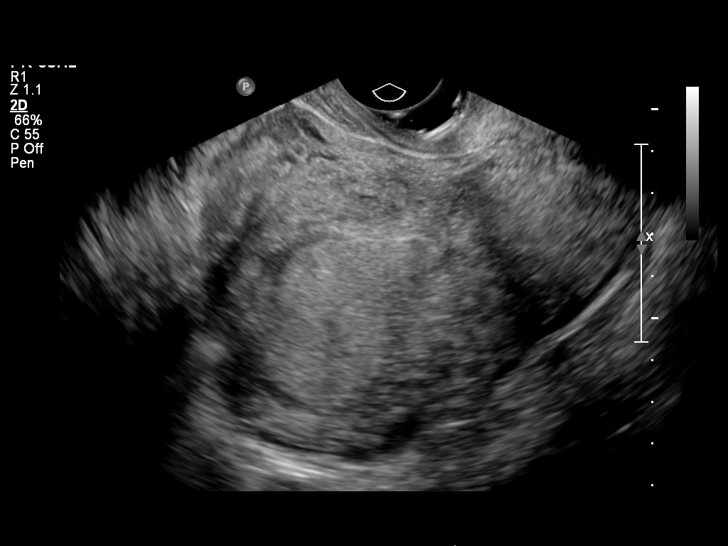
[im 47/63]
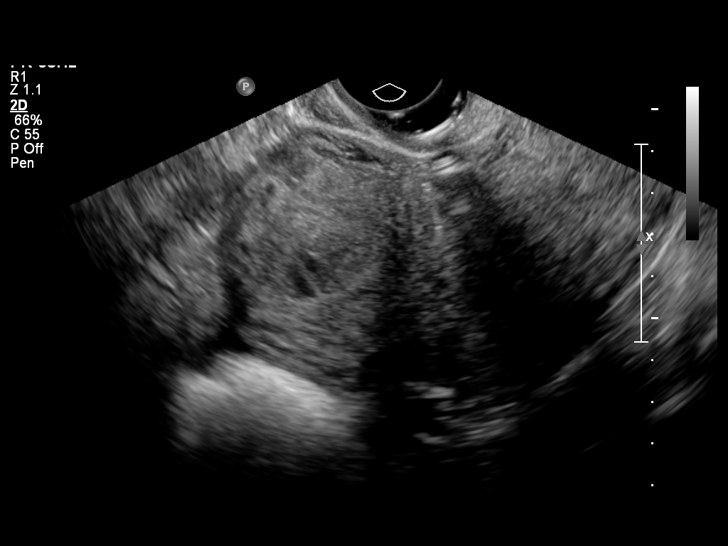
[im 52/63]
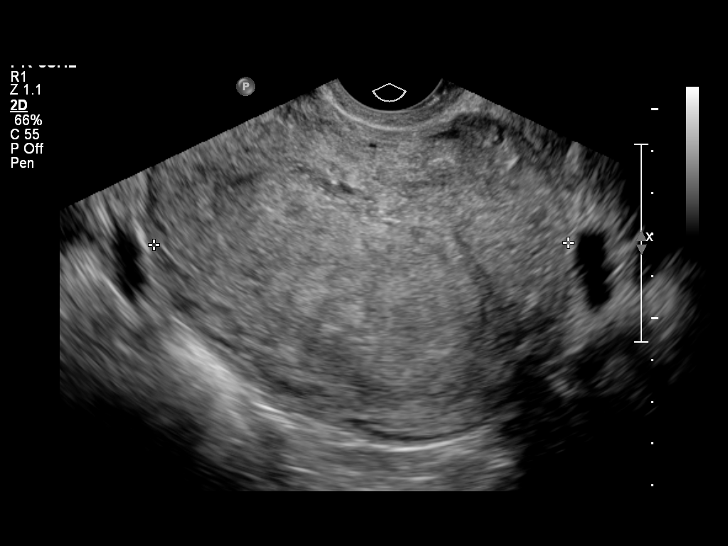
[im 57/63]
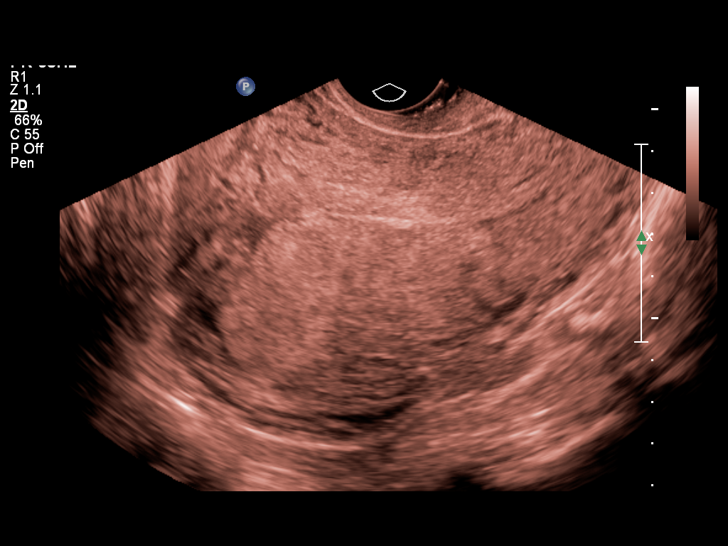
[im 63/63]
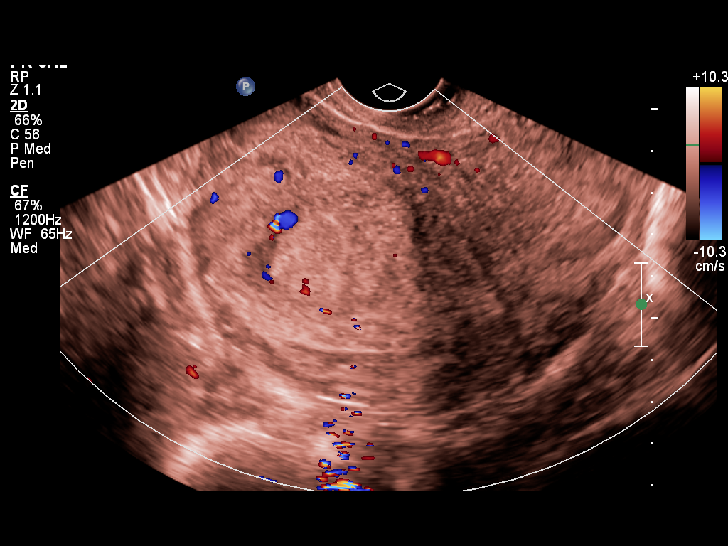

[14 of 25 positions shown; findings below may reference images not displayed]

FINDINGS: Uterus

13 x 8 x 10 cm. There is heterogeneous, predominant hyperechoic,
material within the endometrial canal, measuring up to 25 mm in
thickness. Color and power Doppler imaging shows vascularization of
the tissue.

Right ovary

Measurements: 3.7 x 2.2 x 1.9 cm. Normal appearance/no adnexal mass.

Left ovary

Measurements: 5.7 x 3.4 x 2.8 cm. Size differential related to a 3
cm intra-ovarian cyst..

Other findings

No free fluid.
IMPRESSION: Positive for retained products of conception.

## 2014-12-05 ENCOUNTER — Emergency Department (HOSPITAL_COMMUNITY)
Admission: EM | Admit: 2014-12-05 | Discharge: 2014-12-05 | Disposition: A | Discharge status: Home / Self Care | Payer: Commercial Managed Care - HMO | Attending: Emergency Medicine | Admitting: Emergency Medicine

## 2014-12-05 ENCOUNTER — Encounter (HOSPITAL_COMMUNITY): Payer: Self-pay

## 2014-12-05 ENCOUNTER — Emergency Department (HOSPITAL_COMMUNITY): Payer: Commercial Managed Care - HMO

## 2014-12-05 DIAGNOSIS — Y990 Civilian activity done for income or pay: Secondary | ICD-10-CM | POA: Insufficient documentation

## 2014-12-05 DIAGNOSIS — S6992XA Unspecified injury of left wrist, hand and finger(s), initial encounter: Secondary | ICD-10-CM | POA: Diagnosis present

## 2014-12-05 DIAGNOSIS — Z862 Personal history of diseases of the blood and blood-forming organs and certain disorders involving the immune mechanism: Secondary | ICD-10-CM | POA: Diagnosis not present

## 2014-12-05 DIAGNOSIS — F329 Major depressive disorder, single episode, unspecified: Secondary | ICD-10-CM | POA: Insufficient documentation

## 2014-12-05 DIAGNOSIS — F4310 Post-traumatic stress disorder, unspecified: Secondary | ICD-10-CM | POA: Diagnosis not present

## 2014-12-05 DIAGNOSIS — W228XXA Striking against or struck by other objects, initial encounter: Secondary | ICD-10-CM | POA: Diagnosis not present

## 2014-12-05 DIAGNOSIS — Y9389 Activity, other specified: Secondary | ICD-10-CM | POA: Diagnosis not present

## 2014-12-05 DIAGNOSIS — F419 Anxiety disorder, unspecified: Secondary | ICD-10-CM | POA: Insufficient documentation

## 2014-12-05 DIAGNOSIS — F17210 Nicotine dependence, cigarettes, uncomplicated: Secondary | ICD-10-CM | POA: Insufficient documentation

## 2014-12-05 DIAGNOSIS — Z8781 Personal history of (healed) traumatic fracture: Secondary | ICD-10-CM | POA: Diagnosis not present

## 2014-12-05 DIAGNOSIS — Y9289 Other specified places as the place of occurrence of the external cause: Secondary | ICD-10-CM | POA: Diagnosis not present

## 2014-12-05 DIAGNOSIS — Z8614 Personal history of Methicillin resistant Staphylococcus aureus infection: Secondary | ICD-10-CM | POA: Insufficient documentation

## 2014-12-05 DIAGNOSIS — Z79899 Other long term (current) drug therapy: Secondary | ICD-10-CM | POA: Insufficient documentation

## 2014-12-05 DIAGNOSIS — Z91040 Latex allergy status: Secondary | ICD-10-CM | POA: Diagnosis not present

## 2014-12-05 DIAGNOSIS — Z8679 Personal history of other diseases of the circulatory system: Secondary | ICD-10-CM | POA: Diagnosis not present

## 2014-12-05 DIAGNOSIS — M79642 Pain in left hand: Secondary | ICD-10-CM

## 2014-12-05 HISTORY — DX: Post-traumatic stress disorder, unspecified: F43.10

## 2014-12-05 MED ORDER — NAPROXEN 500 MG PO TABS: 500.0000 mg | ORAL_TABLET | Freq: Two times a day (BID) | ORAL | Status: DC

## 2014-12-05 NOTE — ED Notes (Signed)
Pt c/o L hand injury/pain x 1 week and facial swelling x "a couple days."  Pain score 5/10.  Pt reports hitting L hand on a counter last week.  Hx of fracture x 1 year ago.  Pt concerned about MRSA.  Sts she has had several "bumps" develop on her face. They have resolved w/o treatment.  Currently, Pt has a "bump" on her chin.

## 2014-12-05 NOTE — ED Provider Notes (Signed)
CSN: [id number]     Arrival date & time 12/05/14  1147 History  By signing my name below, I, Placido Sou, attest that this documentation has been prepared under the direction and in the presence of Cheri Fowler, PA-C. Electronically Signed: Placido Sou, ED Scribe. 12/05/2014. 12:49 PM.    Chief Complaint  Patient presents with  . Hand Injury  . Facial Swelling   The history is provided by the patient. No language interpreter was used.    HPI Comments: Taylor Flowers is a 24 y.o. female who presents to the Emergency Department complaining of worsening, moderate, gradual onset, non-radiating, left hand pain with onset 1 week ago. She notes hitting the affected hand on the edge of a table while at work. Pt notes applying a splint to the hand at night as well as taking 800 mg ibuprofen and OTC tylenol which she says provide short term relief. She notes worsening pain throughout the day while working and moving the affected hand. Pt notes associated, mild, numbness to the affected hand at night stating that it's "cold to touch". She notes a hx of fracture to the affected hand. Pt denies weakness, fevers and chills.   Pt also complains of multiple points of swelling to her face and scalp with onset 2 days ago. She notes associated warmth and mild pain to the affected areas further noting she applies warm compresses and topical antibiotics to the affected regions which resolve the areas. She notes a hx of MRSA in 2012. She confirms her listed PCP. Pt denies any other associated symptoms at this time.   Past Medical History  Diagnosis Date  . Migraines   . History of anemia     no current med.  Marland Kitchen Spinal headache   . Metacarpal bone fracture 12/2013    left small  . History of MRSA infection 2012    buttock  . Anxiety   . Depression   . PTSD (post-traumatic stress disorder)    Past Surgical History  Procedure Laterality Date  . Dilation and evacuation N/A 01/24/2013    Procedure:  DILATATION AND EVACUATION;  Surgeon: Catalina Antigua, MD;  Location: [facility];  Service: Gynecology;  Laterality: N/A;  . Cervical cerclage N/A 06/13/2013    Procedure: CERCLAGE CERVICAL;  Surgeon: Kathreen Cosier, MD;  Location: [facility];  Service: Gynecology;  Laterality: N/A;  . Dilation and curettage of uterus N/A 08/23/2013    Procedure: Dilatation and Currettage with Ultrasound Guidance;  Surgeon: Kathreen Cosier, MD;  Location: [facility];  Service: Gynecology;  Laterality: N/A;  . Dilation and curettage of uterus  06/2007  . Closed reduction metacarpal with percutaneous pinning Left 12/25/2013    Procedure: LEFT SMALL METACARPAL CLOSED REDUCTION PERCUTANEOUS PINNING ;  Surgeon: Betha Loa, MD;  Location: [facility];  Service: Orthopedics;  Laterality: Left;  . Dilation and evacuation N/A 09/11/2014    Procedure: DILATATION AND EVACUATION;  Surgeon: Lavina Hamman, MD;  Location: [facility];  Service: Gynecology;  Laterality: N/A;   Family History  Problem Relation Age of Onset  . Hypertension Mother   . Hypertension Father   . Diabetes Father   . Vision loss Father    Social History  Substance Use Topics  . Smoking status: Current Every Day Smoker    Types: Cigars  . Smokeless tobacco: Never Used  . Alcohol Use: Yes     Comment: occ   OB History    Gravida Para Term Preterm AB  TAB SAB Ectopic Multiple Living   0  0     Review of Systems A complete 10 system review of systems was obtained and all systems are negative except as noted in the HPI and PMH.  Allergies  Flagyl; Darvocet; and Latex  Home Medications   Prior to Admission medications   Medication Sig Start Date End Date Taking? Authorizing Provider  ALPRAZolam Prudy Feeler) 0.5 MG tablet Take 0.5 mg by mouth at bedtime as needed for anxiety.    Historical Provider, MD  diphenhydrAMINE (SOMINEX) 25 MG tablet Take 25 mg by mouth at bedtime as needed for sleep.    Historical Provider, MD  ibuprofen  (ADVIL,MOTRIN) 800 MG tablet Take 800 mg by mouth every 8 (eight) hours as needed for headache.    Historical Provider, MD  loratadine (CLARITIN) 10 MG tablet Take 10 mg by mouth daily.    Historical Provider, MD  Multiple Vitamins-Minerals (MULTIVITAMIN PO) Take 1 tablet by mouth daily.    Historical Provider, MD  naproxen (NAPROSYN) 500 MG tablet Take 1 tablet (500 mg total) by mouth 2 (two) times daily. 12/05/14   Cheri Fowler, PA-C  sertraline (ZOLOFT) 100 MG tablet Take 50-100 mg by mouth daily as needed (depression).     Historical Provider, MD  zolpidem (AMBIEN) 10 MG tablet Take 10 mg by mouth at bedtime as needed for sleep.    Historical Provider, MD  zolpidem (AMBIEN) 5 MG tablet Take 5 mg by mouth at bedtime as needed for sleep.    Historical Provider, MD   BP 137/95 mmHg  Pulse 75  Temp(Src) 98.8 F (37.1 C) (Oral)  Resp 16  SpO2 97%  LMP 11/27/2014  Breastfeeding? Unknown Physical Exam  Constitutional: She is oriented to person, place, and time. She appears well-developed and well-nourished.  HENT:  Head: Normocephalic and atraumatic.    Mouth/Throat: Oropharynx is clear and moist. No oropharyngeal exudate.  Eyes: Conjunctivae are normal.  Neck: Normal range of motion. Neck supple. No tracheal deviation present.  Cardiovascular: Normal rate, regular rhythm and normal heart sounds.   Capillary refill less than 3 seconds.   Pulmonary/Chest: Effort normal and breath sounds normal. No respiratory distress.  Abdominal: Soft. Bowel sounds are normal. She exhibits no distension. There is no tenderness.  Musculoskeletal: Normal range of motion.       Left wrist: Normal. She exhibits normal range of motion, no tenderness, no bony tenderness, no swelling and no deformity.       Right hand: Normal.       Left hand: She exhibits tenderness and swelling (mild). She exhibits normal range of motion and normal capillary refill. Normal sensation noted. Normal strength noted.        Hands: Neurological: She is alert and oriented to person, place, and time.  Strength and sensation intact bilaterally throughout upper and lower extremities.   Skin: Skin is warm and dry. She is not diaphoretic.  Psychiatric: She has a normal mood and affect. Her behavior is normal.  Nursing note and vitals reviewed.  ED Course  Procedures  DIAGNOSTIC STUDIES: Oxygen Saturation is 97% on RA, normal by my interpretation.    COORDINATION OF CARE: 12:47 PM Pt presents today due to left hand pain and multiple points of swelling and irritation to her face and scalp. Discussed next steps with pt at bedside including reevaluation based on results of the imaging. Pt agreed to plan.  Labs Review Labs Reviewed - No  data to display  Imaging Review Dg Hand Complete Left  12/05/2014  CLINICAL DATA:  Left hand pain following injury last week. History of fifth metacarpal fracture 1 year ago. Initial encounter. EXAM: LEFT HAND - COMPLETE 3+ VIEW COMPARISON:  01/06/2014 radiographs. FINDINGS: The K-wires have been removed in the interval. The fracture of the fifth metacarpal base has healed with mild residual posttraumatic deformity. There are pin tracks within the fourth and fifth metacarpals. No evidence of acute fracture or dislocation. No focal soft tissue abnormalities. IMPRESSION: No acute osseous findings. Interval healing of fifth metacarpal fracture following removal of the K-wires. Electronically Signed   By: Carey BullocksWilliam  Veazey M.D.   On: 12/05/2014 13:05   I have personally reviewed and evaluated these images as part of my medical decision-making.   EKG Interpretation None      MDM   Final diagnoses:  Left hand pain    Patient presents with left hand pain.  VSS, NAD.  Left hand TTP along dorsum of left hand at 4 and 5th digits.  Neurovascularly intact.  Minimal swelling.  Strength intact. Will obtain plain films of left hand.  As far as "bumps" on her face.  No signs of infection or  abscess or cellulitis.  Patient advised to continue warm compresses and follow up with PCP.  Discussed RICE therapy as well as naprosyn for left hand pain.  Evaluation does not show pathology requring ongoing emergent intervention or admission. Pt is hemodynamically stable and mentating appropriately. Discussed findings/results and plan with patient/guardian, who agrees with plan. All questions answered. Return precautions discussed and outpatient follow up given.    I personally performed the services described in this documentation, which was scribed in my presence. The recorded information has been reviewed and is accurate.    Cheri FowlerKayla Acea Yagi, PA-C 12/05/14 1327  Arby BarretteMarcy Pfeiffer, MD 12/19/14 [phone]

## 2014-12-05 NOTE — Discharge Instructions (Signed)
Musculoskeletal Pain Musculoskeletal pain is muscle and boney aches and pains. These pains can occur in any part of the body. Your caregiver may treat you without knowing the cause of the pain. They may treat you if blood or urine tests, X-rays, and other tests were normal.  CAUSES There is often not a definite cause or reason for these pains. These pains may be caused by a type of germ (virus). The discomfort may also come from overuse. Overuse includes working out too hard when your body is not fit. Boney aches also come from weather changes. Bone is sensitive to atmospheric pressure changes. HOME CARE INSTRUCTIONS   Ask when your test results will be ready. Make sure you get your test results.  Only take over-the-counter or prescription medicines for pain, discomfort, or fever as directed by your caregiver. If you were given medications for your condition, do not drive, operate machinery or power tools, or sign legal documents for 24 hours. Do not drink alcohol. Do not take sleeping pills or other medications that may interfere with treatment.  Continue all activities unless the activities cause more pain. When the pain lessens, slowly resume normal activities. Gradually increase the intensity and duration of the activities or exercise.  During periods of severe pain, bed rest may be helpful. Lay or sit in any position that is comfortable.  Putting ice on the injured area.  Put ice in a bag.  Place a towel between your skin and the bag.  Leave the ice on for 15 to 20 minutes, 3 to 4 times a day.  Follow up with your caregiver for continued problems and no reason can be found for the pain. If the pain becomes worse or does not go away, it may be necessary to repeat tests or do additional testing. Your caregiver may need to look further for a possible cause. SEEK IMMEDIATE MEDICAL CARE IF:  You have pain that is getting worse and is not relieved by medications.  You develop chest pain  that is associated with shortness or breath, sweating, feeling sick to your stomach (nauseous), or throw up (vomit).  Your pain becomes localized to the abdomen.  You develop any new symptoms that seem different or that concern you. MAKE SURE YOU:   Understand these instructions.  Will watch your condition.  Will get help right away if you are not doing well or get worse.   This information is not intended to replace advice given to you by your health care provider. Make sure you discuss any questions you have with your health care provider.   Document Released: 01/05/2005 Document Revised: 03/30/2011 Document Reviewed: 09/09/2012 Elsevier Interactive Patient Education 2016 Elsevier Inc.  Hand Contusion   A hand contusion is a deep bruise on your hand area. Contusions are the result of an injury that caused bleeding under the skin. The contusion may turn blue, purple, or yellow. Minor injuries will give you a painless contusion, but more severe contusions may stay painful and swollen for a few weeks.  CAUSES  A contusion is usually caused by a blow, trauma, or direct force to an area of the body.  SYMPTOMS  Swelling and redness of the injured area.  Discoloration of the injured area.  Tenderness and soreness of the injured area.  Pain. DIAGNOSIS  The diagnosis can be made by taking a history and performing a physical exam. An X-ray, CT scan, or MRI may be needed to determine if there were any associated injuries,  such as broken bones (fractures).  TREATMENT  Often, the best treatment for a hand contusion is resting, elevating, icing, and applying cold compresses to the injured area. Over-the-counter medicines may also be recommended for pain control.  HOME CARE INSTRUCTIONS  Put ice on the injured area.  Put ice in a plastic bag.  Place a towel between your skin and the bag.  Leave the ice on for 15-20 minutes, 03-04 times a day. Only take over-the-counter or prescription  medicines as directed by your caregiver. Your caregiver may recommend avoiding anti-inflammatory medicines (aspirin, ibuprofen, and naproxen) for 48 hours because these medicines may increase bruising.  If told, use an elastic wrap as directed. This can help reduce swelling. You may remove the wrap for sleeping, showering, and bathing. If your fingers become numb, cold, or blue, take the wrap off and reapply it more loosely.  Elevate your hand with pillows to reduce swelling.  Avoid overusing your hand if it is painful. SEEK IMMEDIATE MEDICAL CARE IF:  You have increased redness, swelling, or pain in your hand.  Your swelling or pain is not relieved with medicines.  You have loss of feeling in your hand or are unable to move your fingers.  Your hand turns cold or blue.  You have pain when you move your fingers.  Your hand becomes warm to the touch.  Your contusion does not improve in 2 days. MAKE SURE YOU:  Understand these instructions.  Will watch your condition.  Will get help right away if you are not doing well or get worse. This information is not intended to replace advice given to you by your health care provider. Make sure you discuss any questions you have with your health care provider.  Document Released: 06/27/2001 Document Revised: 09/30/2011 Document Reviewed: 06/29/2011  Elsevier Interactive Patient Education [employer] Elsevier Inc.

## 2014-12-19 ENCOUNTER — Other Ambulatory Visit: Payer: Self-pay | Admitting: Otolaryngology

## 2014-12-20 ENCOUNTER — Emergency Department (HOSPITAL_COMMUNITY): Payer: Commercial Managed Care - HMO | Admitting: Certified Registered"

## 2014-12-20 ENCOUNTER — Encounter (HOSPITAL_COMMUNITY): Payer: Self-pay | Admitting: Emergency Medicine

## 2014-12-20 ENCOUNTER — Encounter (HOSPITAL_COMMUNITY)
Admission: EM | Disposition: A | Discharge status: Home / Self Care | Payer: Self-pay | Source: Home / Self Care | Attending: Emergency Medicine

## 2014-12-20 ENCOUNTER — Emergency Department (HOSPITAL_COMMUNITY)
Admission: EM | Admit: 2014-12-20 | Discharge: 2014-12-20 | Disposition: A | Discharge status: Home / Self Care | Payer: Commercial Managed Care - HMO | Attending: Emergency Medicine | Admitting: Emergency Medicine

## 2014-12-20 DIAGNOSIS — F1290 Cannabis use, unspecified, uncomplicated: Secondary | ICD-10-CM | POA: Insufficient documentation

## 2014-12-20 DIAGNOSIS — F419 Anxiety disorder, unspecified: Secondary | ICD-10-CM | POA: Diagnosis not present

## 2014-12-20 DIAGNOSIS — F329 Major depressive disorder, single episode, unspecified: Secondary | ICD-10-CM | POA: Diagnosis not present

## 2014-12-20 DIAGNOSIS — T888XXA Other specified complications of surgical and medical care, not elsewhere classified, initial encounter: Secondary | ICD-10-CM

## 2014-12-20 DIAGNOSIS — J95830 Postprocedural hemorrhage and hematoma of a respiratory system organ or structure following a respiratory system procedure: Secondary | ICD-10-CM | POA: Insufficient documentation

## 2014-12-20 DIAGNOSIS — F17290 Nicotine dependence, other tobacco product, uncomplicated: Secondary | ICD-10-CM | POA: Diagnosis not present

## 2014-12-20 DIAGNOSIS — Z881 Allergy status to other antibiotic agents status: Secondary | ICD-10-CM | POA: Insufficient documentation

## 2014-12-20 DIAGNOSIS — Y836 Removal of other organ (partial) (total) as the cause of abnormal reaction of the patient, or of later complication, without mention of misadventure at the time of the procedure: Secondary | ICD-10-CM | POA: Diagnosis not present

## 2014-12-20 DIAGNOSIS — F4310 Post-traumatic stress disorder, unspecified: Secondary | ICD-10-CM | POA: Diagnosis not present

## 2014-12-20 DIAGNOSIS — Z885 Allergy status to narcotic agent status: Secondary | ICD-10-CM | POA: Diagnosis not present

## 2014-12-20 DIAGNOSIS — Z91040 Latex allergy status: Secondary | ICD-10-CM | POA: Diagnosis not present

## 2014-12-20 HISTORY — DX: Complete or unspecified spontaneous abortion without complication: O03.9

## 2014-12-20 HISTORY — PX: TONSILLECTOMY AND ADENOIDECTOMY: SHX28

## 2014-12-20 LAB — I-STAT CHEM 8, ED
BUN: 10 mg/dL (ref 6–20)
CALCIUM ION: 1.16 mmol/L (ref 1.12–1.23)
Chloride: 101 mmol/L (ref 101–111)
Creatinine, Ser: 0.7 mg/dL (ref 0.44–1.00)
Glucose, Bld: 112 mg/dL — ABNORMAL HIGH (ref 65–99)
HEMATOCRIT: 45 % (ref 36.0–46.0)
HEMOGLOBIN: 15.3 g/dL — AB (ref 12.0–15.0)
POTASSIUM: 3.8 mmol/L (ref 3.5–5.1)
Sodium: 139 mmol/L (ref 135–145)
TCO2: 26 mmol/L (ref 0–100)

## 2014-12-20 SURGERY — TONSILLECTOMY AND ADENOIDECTOMY
Anesthesia: General | Site: Throat

## 2014-12-20 MED ORDER — SUCCINYLCHOLINE CHLORIDE 20 MG/ML IJ SOLN
INTRAMUSCULAR | Status: AC
  Filled 2014-12-20: qty 1

## 2014-12-20 MED ORDER — HYDROMORPHONE HCL 1 MG/ML IJ SOLN
INTRAMUSCULAR | Status: AC
  Filled 2014-12-20: qty 1

## 2014-12-20 MED ORDER — LIDOCAINE HCL (CARDIAC) 20 MG/ML IV SOLN
INTRAVENOUS | Status: AC
  Filled 2014-12-20: qty 5

## 2014-12-20 MED ORDER — MIDAZOLAM HCL 5 MG/5ML IJ SOLN
INTRAMUSCULAR | Status: DC | PRN
  Administered 2014-12-20 (×2): 1 mg via INTRAVENOUS

## 2014-12-20 MED ORDER — MIDAZOLAM HCL 2 MG/2ML IJ SOLN
INTRAMUSCULAR | Status: AC
  Filled 2014-12-20: qty 2

## 2014-12-20 MED ORDER — ONDANSETRON HCL 4 MG/2ML IJ SOLN
INTRAMUSCULAR | Status: AC
  Filled 2014-12-20: qty 2

## 2014-12-20 MED ORDER — BUPIVACAINE-EPINEPHRINE (PF) 0.5% -1:200000 IJ SOLN
INTRAMUSCULAR | Status: AC
  Filled 2014-12-20: qty 1.8

## 2014-12-20 MED ORDER — ONDANSETRON HCL 4 MG/2ML IJ SOLN
INTRAMUSCULAR | Status: DC | PRN
  Administered 2014-12-20: 4 mg via INTRAVENOUS

## 2014-12-20 MED ORDER — LIDOCAINE HCL (CARDIAC) 20 MG/ML IV SOLN
INTRAVENOUS | Status: DC | PRN
  Administered 2014-12-20 (×2): 50 mg via INTRAVENOUS

## 2014-12-20 MED ORDER — ONDANSETRON HCL 4 MG/2ML IJ SOLN
4.0000 mg | Freq: Once | INTRAMUSCULAR | Status: AC
  Administered 2014-12-20: 4 mg via INTRAVENOUS
  Filled 2014-12-20: qty 2

## 2014-12-20 MED ORDER — FENTANYL CITRATE (PF) 100 MCG/2ML IJ SOLN
INTRAMUSCULAR | Status: DC | PRN
  Administered 2014-12-20 (×2): 100 ug via INTRAVENOUS
  Administered 2014-12-20: 50 ug via INTRAVENOUS

## 2014-12-20 MED ORDER — HYDROMORPHONE HCL 1 MG/ML IJ SOLN
0.2500 mg | INTRAMUSCULAR | Status: DC | PRN
  Administered 2014-12-20 (×2): 0.5 mg via INTRAVENOUS

## 2014-12-20 MED ORDER — LACTATED RINGERS IV SOLN
INTRAVENOUS | Status: DC | PRN
  Administered 2014-12-20 (×2): via INTRAVENOUS

## 2014-12-20 MED ORDER — SODIUM CHLORIDE 0.9 % IV BOLUS (SEPSIS)
1000.0000 mL | Freq: Once | INTRAVENOUS | Status: AC
  Administered 2014-12-20: 1000 mL via INTRAVENOUS

## 2014-12-20 MED ORDER — PROPOFOL 10 MG/ML IV BOLUS
INTRAVENOUS | Status: AC
  Filled 2014-12-20: qty 40

## 2014-12-20 MED ORDER — PROMETHAZINE HCL 25 MG/ML IJ SOLN: 6.2500 mg | INTRAMUSCULAR | Status: DC | PRN

## 2014-12-20 MED ORDER — BUPIVACAINE-EPINEPHRINE (PF) 0.5% -1:200000 IJ SOLN
INTRAMUSCULAR | Status: AC
  Filled 2014-12-20: qty 30

## 2014-12-20 MED ORDER — PROPOFOL 10 MG/ML IV BOLUS
INTRAVENOUS | Status: DC | PRN
  Administered 2014-12-20: 100 mg via INTRAVENOUS
  Administered 2014-12-20: 200 mg via INTRAVENOUS

## 2014-12-20 MED ORDER — MORPHINE SULFATE (PF) 4 MG/ML IV SOLN
4.0000 mg | Freq: Once | INTRAVENOUS | Status: AC
  Administered 2014-12-20: 4 mg via INTRAVENOUS
  Filled 2014-12-20: qty 1

## 2014-12-20 MED ORDER — SUCCINYLCHOLINE CHLORIDE 20 MG/ML IJ SOLN
INTRAMUSCULAR | Status: DC | PRN
  Administered 2014-12-20: 30 mg via INTRAVENOUS
  Administered 2014-12-20: 170 mg via INTRAVENOUS

## 2014-12-20 MED ORDER — FENTANYL CITRATE (PF) 250 MCG/5ML IJ SOLN
INTRAMUSCULAR | Status: AC
  Filled 2014-12-20: qty 5

## 2014-12-20 SURGICAL SUPPLY — 43 items
BLADE MYRINGOTOMY 6 SPEAR HDL (BLADE) ×2 IMPLANT
BLADE MYRINGOTOMY 6" SPEAR HDL (BLADE) ×1
BLADE SURG 15 STRL LF DISP TIS (BLADE) IMPLANT
BLADE SURG 15 STRL SS (BLADE)
CANISTER SUCTION 2500CC (MISCELLANEOUS) ×3 IMPLANT
CATH ROBINSON RED A/P 10FR (CATHETERS) IMPLANT
CLEANER TIP ELECTROSURG 2X2 (MISCELLANEOUS) ×3 IMPLANT
COAGULATOR SUCT 6 FR SWTCH (ELECTROSURGICAL)
COAGULATOR SUCT SWTCH 10FR 6 (ELECTROSURGICAL) IMPLANT
COTTON STERILE ROLL (GAUZE/BANDAGES/DRESSINGS) ×3 IMPLANT
CRADLE DONUT ADULT HEAD (MISCELLANEOUS) IMPLANT
DECANTER SPIKE VIAL GLASS SM (MISCELLANEOUS) ×3 IMPLANT
DRAPE PROXIMA HALF (DRAPES) IMPLANT
ELECT COATED BLADE 2.86 ST (ELECTRODE) ×3 IMPLANT
ELECT REM PT RETURN 9FT ADLT (ELECTROSURGICAL)
ELECT REM PT RETURN 9FT PED (ELECTROSURGICAL)
ELECTRODE REM PT RETRN 9FT PED (ELECTROSURGICAL) IMPLANT
ELECTRODE REM PT RTRN 9FT ADLT (ELECTROSURGICAL) IMPLANT
GAUZE SPONGE 4X4 16PLY XRAY LF (GAUZE/BANDAGES/DRESSINGS) ×3 IMPLANT
GLOVE ECLIPSE 8.0 STRL XLNG CF (GLOVE) ×3 IMPLANT
GOWN STRL REUS W/ TWL LRG LVL3 (GOWN DISPOSABLE) ×1 IMPLANT
GOWN STRL REUS W/ TWL XL LVL3 (GOWN DISPOSABLE) ×1 IMPLANT
GOWN STRL REUS W/TWL LRG LVL3 (GOWN DISPOSABLE) ×2
GOWN STRL REUS W/TWL XL LVL3 (GOWN DISPOSABLE) ×2
KIT BASIN OR (CUSTOM PROCEDURE TRAY) ×3 IMPLANT
KIT ROOM TURNOVER OR (KITS) ×3 IMPLANT
NEEDLE HYPO 25GX1X1/2 BEV (NEEDLE) IMPLANT
NEEDLE SPNL 22GX3.5 QUINCKE BK (NEEDLE) ×3 IMPLANT
NS IRRIG 1000ML POUR BTL (IV SOLUTION) ×3 IMPLANT
PACK SURGICAL SETUP 50X90 (CUSTOM PROCEDURE TRAY) ×3 IMPLANT
PAD ARMBOARD 7.5X6 YLW CONV (MISCELLANEOUS) ×6 IMPLANT
PENCIL FOOT CONTROL (ELECTRODE) ×3 IMPLANT
SPECIMEN JAR SMALL (MISCELLANEOUS) ×6 IMPLANT
SPONGE TONSIL 1 RF SGL (DISPOSABLE) ×3 IMPLANT
SYR BULB 3OZ (MISCELLANEOUS) ×3 IMPLANT
SYR CONTROL 10ML LL (SYRINGE) ×3 IMPLANT
TOWEL OR 17X24 6PK STRL BLUE (TOWEL DISPOSABLE) ×6 IMPLANT
TUBE CONNECTING 12'X1/4 (SUCTIONS) ×1
TUBE CONNECTING 12X1/4 (SUCTIONS) ×2 IMPLANT
TUBE SALEM SUMP 14F W/ARV (TUBING) IMPLANT
TUBE SALEM SUMP 16 FR W/ARV (TUBING) IMPLANT
WATER STERILE IRR 1000ML POUR (IV SOLUTION) ×3 IMPLANT
YANKAUER SUCT BULB TIP NO VENT (SUCTIONS) ×3 IMPLANT

## 2014-12-20 NOTE — ED Notes (Signed)
C/O throat pain with swallowing and talking. Pt continues to spit up blood since tonsillectomy yx at 2345. Pt reports taking tylenol 3 around 2200 last night no meds since. Pt isn't currently on any  Antibiotics. Pt reports trying to gargle with salt water and drinking cold water with little to no relief.

## 2014-12-20 NOTE — ED Provider Notes (Signed)
CSN: [id number]     Arrival date & time 12/20/14  0424 History   First MD Initiated Contact with Patient 12/20/14 0501     Chief Complaint  Patient presents with  . Sore Throat    tonsillectomy     (Consider location/radiation/quality/duration/timing/severity/associated sxs/prior Treatment) HPI  This is a 24 year old female status post tonsillectomy yesterday by Dr. Emeline DarlingGore who presents with tonsillar bleeding. Patient reports onset of bleeding last night right before midnight. She called and has tried gargling salt water and drinking cold water with no relief. She currently rates pain a 10 out of 10. She was unable to get her oxycodone filled. She has had persistent bleeding. Reports difficulty swallowing. History is limited as patient is using a pen and paper to provide history.  Past Medical History  Diagnosis Date  . Migraines   . History of anemia     no current med.  Marland Kitchen. Spinal headache   . Metacarpal bone fracture 12/2013    left small  . History of MRSA infection 2012    buttock  . Anxiety   . Depression   . PTSD (post-traumatic stress disorder)   . Miscarriage    Past Surgical History  Procedure Laterality Date  . Dilation and evacuation N/A 01/24/2013    Procedure: DILATATION AND EVACUATION;  Surgeon: Catalina AntiguaPeggy Constant, MD;  Location: [facility];  Service: Gynecology;  Laterality: N/A;  . Cervical cerclage N/A 06/13/2013    Procedure: CERCLAGE CERVICAL;  Surgeon: Kathreen CosierBernard A Marshall, MD;  Location: [facility];  Service: Gynecology;  Laterality: N/A;  . Dilation and curettage of uterus N/A 08/23/2013    Procedure: Dilatation and Currettage with Ultrasound Guidance;  Surgeon: Kathreen CosierBernard A Marshall, MD;  Location: [facility];  Service: Gynecology;  Laterality: N/A;  . Dilation and curettage of uterus  06/2007  . Closed reduction metacarpal with percutaneous pinning Left 12/25/2013    Procedure: LEFT SMALL METACARPAL CLOSED REDUCTION PERCUTANEOUS PINNING ;  Surgeon: Betha LoaKevin Kuzma, MD;  Location: [facility];  Service: Orthopedics;  Laterality: Left;  . Dilation and evacuation N/A 09/11/2014    Procedure: DILATATION AND EVACUATION;  Surgeon: Lavina Hammanodd Meisinger, MD;  Location: [facility];  Service: Gynecology;  Laterality: N/A;  . Tonsillectomy     Family History  Problem Relation Age of Onset  . Hypertension Mother   . Hypertension Father   . Diabetes Father   . Vision loss Father    Social History  Substance Use Topics  . Smoking status: Current Some Day Smoker    Types: Cigars  . Smokeless tobacco: Never Used  . Alcohol Use: Yes     Comment: occ   OB History    Gravida Para Term Preterm AB TAB SAB Ectopic Multiple Living   4 1  1 2  2  0  0     Review of Systems  HENT: Positive for trouble swallowing and voice change.        Tonsillar bleeding  Respiratory: Negative for shortness of breath.   All other systems reviewed and are negative.     Allergies  Flagyl; Darvocet; and Latex  Home Medications   Prior to Admission medications   Medication Sig Start Date End Date Taking? Authorizing Provider  ALPRAZolam Prudy Feeler(XANAX) 0.5 MG tablet Take 0.5 mg by mouth at bedtime as needed for anxiety.    Historical Provider, MD  diphenhydrAMINE (SOMINEX) 25 MG tablet Take 25 mg by mouth at bedtime as needed for sleep.    Historical Provider,  MD  ibuprofen (ADVIL,MOTRIN) 800 MG tablet Take 800 mg by mouth every 8 (eight) hours as needed for headache.    Historical Provider, MD  loratadine (CLARITIN) 10 MG tablet Take 10 mg by mouth daily.    Historical Provider, MD  Multiple Vitamins-Minerals (MULTIVITAMIN PO) Take 1 tablet by mouth daily.    Historical Provider, MD  naproxen (NAPROSYN) 500 MG tablet Take 1 tablet (500 mg total) by mouth 2 (two) times daily. 12/05/14   Cheri Fowler, PA-C  sertraline (ZOLOFT) 100 MG tablet Take 50-100 mg by mouth daily as needed (depression).     Historical Provider, MD  zolpidem (AMBIEN) 10 MG tablet Take 10 mg by mouth at bedtime as needed for sleep.     Historical Provider, MD  zolpidem (AMBIEN) 5 MG tablet Take 5 mg by mouth at bedtime as needed for sleep.    Historical Provider, MD   BP 142/95 mmHg  Pulse 52  Temp(Src) 98.6 F (37 C)  Resp 18  Wt 292 lb 8.8 oz (132.7 kg)  SpO2 87%  LMP 11/27/2014 Physical Exam  Constitutional: She is oriented to person, place, and time. She appears well-developed and well-nourished.  Obese  HENT:  Head: Normocephalic and atraumatic.  Actively spitting into an emesis basin, bright red blood noted, posterior pharynx diffusely edematous, extensive blood clot noted over the right tonsil with active oozing  Cardiovascular: Normal rate, regular rhythm and normal heart sounds.   No murmur heard. Pulmonary/Chest: Effort normal and breath sounds normal. No respiratory distress. She has no wheezes.  Abdominal: Soft. There is no tenderness.  Neurological: She is alert and oriented to person, place, and time.  Skin: Skin is warm and dry.  Psychiatric: She has a normal mood and affect.  Nursing note and vitals reviewed.   ED Course  Procedures (including critical care time) Labs Review Labs Reviewed  I-STAT CHEM 8, ED - Abnormal; Notable for the following:    Glucose, Bld 112 (*)    Hemoglobin 15.3 (*)    All other components within normal limits    Imaging Review No results found. I have personally reviewed and evaluated these images and lab results as part of my medical decision-making.   EKG Interpretation None      MDM   Final diagnoses:  Post-tonsillectomy hemorrhage, initial encounter    Patient presents with postop hemorrhage. ABCs intact at this time. She does have evidence of clot and active bleeding.  IV was placed. Patient was given pain medication and fluids. Discussed with Dr. Lazarus Salines on call for Dr. core. Patient will need to go back to the OR for cautery.      Shon Baton, MD 12/20/14 [phone]

## 2014-12-20 NOTE — Anesthesia Procedure Notes (Signed)
Procedure Name: Intubation Date/Time: 12/20/2014 7:16 AM Performed by: Fransisca KaufmannMEYER, Tavio Biegel E Pre-anesthesia Checklist: Patient identified, Emergency Drugs available, Suction available, Patient being monitored and Timeout performed Patient Re-evaluated:Patient Re-evaluated prior to inductionOxygen Delivery Method: Circle system utilized Preoxygenation: Pre-oxygenation with 100% oxygen Intubation Type: IV induction and Rapid sequence Laryngoscope Size: Mac and 3 Grade View: Grade I Tube type: Oral Tube size: 7.5 mm Number of attempts: 1 Airway Equipment and Method: Stylet Placement Confirmation: ETT inserted through vocal cords under direct vision,  positive ETCO2 and breath sounds checked- equal and bilateral Secured at: 22 cm Tube secured with: Tape Dental Injury: Teeth and Oropharynx as per pre-operative assessment

## 2014-12-20 NOTE — Consult Note (Signed)
Taylor Flowers, Taylor Flowers 24 y.o., female [id number]     Chief Complaint: bleeding from throat  HPI: 24 yo bf, underwent tonsillectomy per Dr. Emeline Darling yesterday AM.  Onset bleeding 2345.  Stopped temporarily with ice water gargles.  Resumed on way into ED.  No known bleeding issues, but had "hemorrhage" following D&C  Last year with Dr. Jackelyn Flowers.  No work up at that time.  PMH: Past Medical History  Diagnosis Date  . Migraines   . History of anemia     no current med.  Marland Kitchen Spinal headache   . Metacarpal bone fracture 12/2013    left small  . History of MRSA infection 2012    buttock  . Anxiety   . Depression   . PTSD (post-traumatic stress disorder)   . Miscarriage     Surg Hx: Past Surgical History  Procedure Laterality Date  . Dilation and evacuation N/A 01/24/2013    Procedure: DILATATION AND EVACUATION;  Surgeon: Taylor Antigua, MD;  Location: [facility];  Service: Gynecology;  Laterality: N/A;  . Cervical cerclage N/A 06/13/2013    Procedure: CERCLAGE CERVICAL;  Surgeon: Taylor Cosier, MD;  Location: [facility];  Service: Gynecology;  Laterality: N/A;  . Dilation and curettage of uterus N/A 08/23/2013    Procedure: Dilatation and Currettage with Ultrasound Guidance;  Surgeon: Taylor Cosier, MD;  Location: [facility];  Service: Gynecology;  Laterality: N/A;  . Dilation and curettage of uterus  06/2007  . Closed reduction metacarpal with percutaneous pinning Left 12/25/2013    Procedure: LEFT SMALL METACARPAL CLOSED REDUCTION PERCUTANEOUS PINNING ;  Surgeon: Taylor Loa, MD;  Location: [facility];  Service: Orthopedics;  Laterality: Left;  . Dilation and evacuation N/A 09/11/2014    Procedure: DILATATION AND EVACUATION;  Surgeon: Taylor Hamman, MD;  Location: [facility];  Service: Gynecology;  Laterality: N/A;  . Tonsillectomy      FHx:   Family History  Problem Relation Age of Onset  . Hypertension Mother   . Hypertension Father   . Diabetes Father   . Vision loss Father     SocHx:  reports that she has been smoking Cigars.  She has never used smokeless tobacco. She reports that she drinks alcohol. She reports that she uses illicit drugs (Marijuana) about 7 times per week.  ALLERGIES:  Allergies  Allergen Reactions  . Flagyl [Metronidazole] Shortness Of Breath  . Darvocet [Propoxyphene N-Acetaminophen] Other (See Comments)    JOINT PAIN  . Latex Rash     (Not in a hospital admission)  Results for orders placed or performed during the hospital encounter of 12/20/14 (from the past 48 hour(s))  I-Stat Chem 8, ED     Status: Abnormal   Collection Time: 12/20/14  5:08 AM  Result Value Ref Range   Sodium 139 135 - 145 mmol/L   Potassium 3.8 3.5 - 5.1 mmol/L   Chloride 101 101 - 111 mmol/L   BUN 10 6 - 20 mg/dL   Creatinine, Ser 6.96 0.44 - 1.00 mg/dL   Glucose, Bld 295 (H) 65 - 99 mg/dL   Calcium, Ion 2.84 1.32 - 1.23 mmol/L   TCO2 26 0 - 100 mmol/L   Hemoglobin 15.3 (H) 12.0 - 15.0 g/dL   HCT 44.0 10.2 - 72.5 %   No results found.  ROS: non contrib  Blood pressure 138/84, pulse 79, temperature 98.6 F (37 C), resp. rate 18, weight 132.7 kg (292 lb 8.8 oz), last menstrual period 11/27/2014, SpO2 100 %,  unknown if currently breastfeeding.  PHYSICAL EXAM: Overall appearance: heavyset.  Muffled voice. Breathing well.   Head:  NCAT Ears: not examined Nose: not examined Oral Cavity: moist. Teeth in good repair Oral Pharynx/Hypopharynx/Larynx: large clot RIGHT tonsil fossa without active bleeding Neuro: grossly intact Neck:  clear   Assessment/Plan Post tonsillectomy hemorrhage  To OR for evacuation of clot and cautery.  Discussed with patient.  Informed consent obtained.  [name], Taylor Flowers 12/20/2014, 6:37 AM

## 2014-12-20 NOTE — Discharge Instructions (Signed)
See Dr. Ellyn HackGore's discharge instructions

## 2014-12-20 NOTE — Anesthesia Preprocedure Evaluation (Addendum)
Anesthesia Evaluation  Patient identified by MRN, date of birth, ID band Patient awake    Reviewed: Allergy & Precautions, NPO status , Patient's Chart, lab work & pertinent test results  History of Anesthesia Complications (+) POST - OP SPINAL HEADACHE  Airway Mallampati: II  TM Distance: <3 FB Neck ROM: Full    Dental no notable dental hx. (+) Teeth Intact, Dental Advisory Given   Pulmonary Current Smoker,    Pulmonary exam normal breath sounds clear to auscultation       Cardiovascular negative cardio ROS Normal cardiovascular exam Rhythm:Regular Rate:Normal     Neuro/Psych Anxiety Depression negative neurological ROS  negative psych ROS   GI/Hepatic negative GI ROS, Neg liver ROS,   Endo/Other  Morbid obesity  Renal/GU negative Renal ROS  negative genitourinary   Musculoskeletal negative musculoskeletal ROS (+)   Abdominal (+) + obese,   Peds negative pediatric ROS (+)  Hematology negative hematology ROS (+)   Anesthesia Other Findings   Reproductive/Obstetrics negative OB ROS LMP 11/27/14                            Anesthesia Physical Anesthesia Plan  ASA: II and emergent  Anesthesia Plan: General   Post-op Pain Management:    Induction: Intravenous and Rapid sequence  Airway Management Planned: Oral ETT and Video Laryngoscope Planned  Additional Equipment:   Intra-op Plan:   Post-operative Plan: Extubation in OR  Informed Consent: I have reviewed the patients History and Physical, chart, labs and discussed the procedure including the risks, benefits and alternatives for the proposed anesthesia with the patient or authorized representative who has indicated his/her understanding and acceptance.   Dental advisory given  Plan Discussed with: CRNA and Surgeon  Anesthesia Plan Comments: (Plan routine monitors, GETA with RSI)       Anesthesia Quick Evaluation

## 2014-12-20 NOTE — ED Notes (Signed)
Dr. Horton at bedside at this time.  

## 2014-12-20 NOTE — Transfer of Care (Signed)
Immediate Anesthesia Transfer of Care Note  Patient: Taylor Flowers  Procedure(s) Performed: Procedure(s): CONTROL TONSIL  BLEED (N/A)  Patient Location: PACU  Anesthesia Type:General  Level of Consciousness: awake, alert , oriented and sedated  Airway & Oxygen Therapy: Patient Spontanous Breathing and Patient connected to nasal cannula oxygen  Post-op Assessment: Report given to RN, Post -op Vital signs reviewed and stable and Patient moving all extremities  Post vital signs: Reviewed and stable  Last Vitals:  Filed Vitals:   12/20/14 0530 12/20/14 0545  BP: 136/87 138/84  Pulse: 86 79  Temp:    Resp:      Complications: No apparent anesthesia complications

## 2014-12-20 NOTE — Anesthesia Postprocedure Evaluation (Signed)
Anesthesia Post Note  Patient: Taylor Flowers  Procedure(s) Performed: Procedure(s) (LRB): CONTROL TONSIL  BLEED (N/A)  Patient location during evaluation: PACU Anesthesia Type: General Level of consciousness: awake and alert Pain management: pain level controlled Vital Signs Assessment: post-procedure vital signs reviewed and stable Respiratory status: spontaneous breathing, nonlabored ventilation, respiratory function stable and patient connected to nasal cannula oxygen Cardiovascular status: blood pressure returned to baseline and stable Postop Assessment: no signs of nausea or vomiting Anesthetic complications: no    Last Vitals:  Filed Vitals:   12/20/14 0900 12/20/14 0915  BP: 98/48 98/48  Pulse: 90 93  Temp:    Resp:      Last Pain:  Filed Vitals:   12/20/14 0917  PainSc: 6                  Kein Carlberg S

## 2014-12-20 NOTE — ED Notes (Signed)
Spoke with Darlene in or, patient ready for OR transport.

## 2014-12-20 NOTE — Progress Notes (Signed)
Report given to jamie rn as caregiver 

## 2014-12-20 NOTE — Op Note (Signed)
12/20/2014  7:49 AM    Taylor Flowers, Taylor Flowers  [id number]   Pre-Op Dx:  Post tonsillectomy hemorrhage  Post-op Dx: Same  Proc: Control right tonsil hemorrhage   Surg:  [name], [name] MD  Anes:  GOT  EBL:  Minimal  Comp:  None  Findings:  Tonsil fossae consistent with tonsillectomy yesterday with moderate to large uvular edema. Small arteriolar bleeding from the superior pole of the right tonsil fossa.  Procedure: With the patient in a comfortable supine position, general orotracheal anesthesia was induced without difficulty. At an appropriate level, the table was turned 90 the patient placed in Trendelenburg. A surgical timeout was performed in the standard fashion.  Taking care to protect lips, teeth, and endotracheal tube, the Crowe Davis mouth gag was introduced, expanded for visualization, and suspended from the Mayo stand in the standard fashion. A large clot was noted in the superior pole of the right tonsil fossa. Otherwise the findings were as described above.  An 5918 French orogastric tube was passed and roughly 100 mL of old blood was evacuated. This tube was removed.   Using suction cautery, the clot was evacuated, and bleeding in the superior pole of the right tonsil fossa was controlled with cautery. After achieving control, mouthgag was relaxed for several minutes. Upon reexpansion, hemostasis was persisted. At this point the procedure was completed. The mouthgag was relaxed and removed. The dental status was intact. Patient was returned anesthesia, awakened, extubated, and transferred to recovery in stable condition.  Dispo:   PACU to home  Plan:  Resume analgesics. Advance diet as comfortable. Recheck Dr. Emeline DarlingGore one week.  As this is a second significant bleeding episode following surgery, it may be appropriate to have her evaluated for possible bleeding diathesis including von Willebrand's disease.  [name],  [name] MD

## 2014-12-21 ENCOUNTER — Encounter (HOSPITAL_COMMUNITY): Payer: Self-pay | Admitting: Otolaryngology

## 2014-12-24 ENCOUNTER — Emergency Department (HOSPITAL_COMMUNITY)
Admission: EM | Admit: 2014-12-24 | Discharge: 2014-12-24 | Disposition: A | Discharge status: Home / Self Care | Payer: Commercial Managed Care - HMO | Attending: Emergency Medicine | Admitting: Emergency Medicine

## 2014-12-24 ENCOUNTER — Encounter (HOSPITAL_COMMUNITY): Payer: Self-pay | Admitting: Family Medicine

## 2014-12-24 DIAGNOSIS — F17200 Nicotine dependence, unspecified, uncomplicated: Secondary | ICD-10-CM | POA: Diagnosis not present

## 2014-12-24 DIAGNOSIS — T888XXD Other specified complications of surgical and medical care, not elsewhere classified, subsequent encounter: Secondary | ICD-10-CM

## 2014-12-24 DIAGNOSIS — Z8679 Personal history of other diseases of the circulatory system: Secondary | ICD-10-CM | POA: Insufficient documentation

## 2014-12-24 DIAGNOSIS — Z79899 Other long term (current) drug therapy: Secondary | ICD-10-CM | POA: Insufficient documentation

## 2014-12-24 DIAGNOSIS — J029 Acute pharyngitis, unspecified: Secondary | ICD-10-CM | POA: Diagnosis present

## 2014-12-24 DIAGNOSIS — Z8781 Personal history of (healed) traumatic fracture: Secondary | ICD-10-CM | POA: Insufficient documentation

## 2014-12-24 DIAGNOSIS — Z862 Personal history of diseases of the blood and blood-forming organs and certain disorders involving the immune mechanism: Secondary | ICD-10-CM | POA: Insufficient documentation

## 2014-12-24 DIAGNOSIS — F4310 Post-traumatic stress disorder, unspecified: Secondary | ICD-10-CM | POA: Diagnosis not present

## 2014-12-24 DIAGNOSIS — Z792 Long term (current) use of antibiotics: Secondary | ICD-10-CM | POA: Insufficient documentation

## 2014-12-24 DIAGNOSIS — Z91040 Latex allergy status: Secondary | ICD-10-CM | POA: Insufficient documentation

## 2014-12-24 DIAGNOSIS — J95830 Postprocedural hemorrhage and hematoma of a respiratory system organ or structure following a respiratory system procedure: Secondary | ICD-10-CM | POA: Diagnosis not present

## 2014-12-24 DIAGNOSIS — F419 Anxiety disorder, unspecified: Secondary | ICD-10-CM | POA: Insufficient documentation

## 2014-12-24 DIAGNOSIS — Z8614 Personal history of Methicillin resistant Staphylococcus aureus infection: Secondary | ICD-10-CM | POA: Insufficient documentation

## 2014-12-24 DIAGNOSIS — F329 Major depressive disorder, single episode, unspecified: Secondary | ICD-10-CM | POA: Diagnosis not present

## 2014-12-24 LAB — CBC
HCT: 40.7 % (ref 36.0–46.0)
Hemoglobin: 13.5 g/dL (ref 12.0–15.0)
MCH: 29 pg (ref 26.0–34.0)
MCHC: 33.2 g/dL (ref 30.0–36.0)
MCV: 87.3 fL (ref 78.0–100.0)
PLATELETS: 313 10*3/uL (ref 150–400)
RBC: 4.66 MIL/uL (ref 3.87–5.11)
RDW: 13.7 % (ref 11.5–15.5)
WBC: 9.3 10*3/uL (ref 4.0–10.5)

## 2014-12-24 LAB — COMPREHENSIVE METABOLIC PANEL
ALT: 23 U/L (ref 14–54)
AST: 17 U/L (ref 15–41)
Albumin: 3.6 g/dL (ref 3.5–5.0)
Alkaline Phosphatase: 72 U/L (ref 38–126)
Anion gap: 7 (ref 5–15)
BILIRUBIN TOTAL: 0.5 mg/dL (ref 0.3–1.2)
BUN: 7 mg/dL (ref 6–20)
CHLORIDE: 102 mmol/L (ref 101–111)
CO2: 28 mmol/L (ref 22–32)
CREATININE: 0.8 mg/dL (ref 0.44–1.00)
Calcium: 9.2 mg/dL (ref 8.9–10.3)
GFR calc Af Amer: >60 mL/min (ref >60)
Glucose, Bld: 92 mg/dL (ref 65–99)
Potassium: 3.7 mmol/L (ref 3.5–5.1)
Sodium: 137 mmol/L (ref 135–145)
TOTAL PROTEIN: 7.3 g/dL (ref 6.5–8.1)

## 2014-12-24 NOTE — ED Notes (Signed)
Pt here due to bleeding from where she has a tonsillectomy. sts started today. sts hx of bleeding. sts 2nd time this has happened since surgery

## 2014-12-24 NOTE — Discharge Instructions (Signed)
Please monitor for any new or worsening signs or symptoms. If bleeding returns please return to the emergency room for further evaluation. Please call Dr. Emeline DarlingGore tomorrow morning and inform him of today's ED visit and all relevant data.

## 2014-12-24 NOTE — ED Provider Notes (Signed)
CSN: [id number]     Arrival date & time 12/24/14  1832 History  By signing my name below, I, Gonzella Lex, attest that this documentation has been prepared under the direction and in the presence of [employer], PA-C. Electronically Signed: Gonzella Lex, Scribe. 12/24/2014. 10:24 PM.    Chief Complaint  Patient presents with  . Sore Throat    The history is provided by the patient. No language interpreter was used.    HPI Comments:   Taylor Flowers is a 24 y.o. female who presents to the Emergency Department complaining of bleeding in the back, top right area of her mouth which has now resolved. She reports the bleeding began five hours ago and ceased about two and a half hours ago. She notes tonsillectomy and adenoidectomy five days ago and reports recauterization four days ago. She also notes that she has an appointment to see her physician coming up four days from now.   Past Medical History  Diagnosis Date  . Migraines   . History of anemia     no current med.  Marland Kitchen Spinal headache   . Metacarpal bone fracture 12/2013    left small  . History of MRSA infection 2012    buttock  . Anxiety   . Depression   . PTSD (post-traumatic stress disorder)   . Miscarriage    Past Surgical History  Procedure Laterality Date  . Dilation and evacuation N/A 01/24/2013    Procedure: DILATATION AND EVACUATION;  Surgeon: Catalina Antigua, MD;  Location: [facility];  Service: Gynecology;  Laterality: N/A;  . Cervical cerclage N/A 06/13/2013    Procedure: CERCLAGE CERVICAL;  Surgeon: Kathreen Cosier, MD;  Location: [facility];  Service: Gynecology;  Laterality: N/A;  . Dilation and curettage of uterus N/A 08/23/2013    Procedure: Dilatation and Currettage with Ultrasound Guidance;  Surgeon: Kathreen Cosier, MD;  Location: [facility];  Service: Gynecology;  Laterality: N/A;  . Dilation and curettage of uterus  06/2007  . Closed reduction metacarpal with percutaneous pinning Left 12/25/2013   Procedure: LEFT SMALL METACARPAL CLOSED REDUCTION PERCUTANEOUS PINNING ;  Surgeon: Betha Loa, MD;  Location: [facility];  Service: Orthopedics;  Laterality: Left;  . Dilation and evacuation N/A 09/11/2014    Procedure: DILATATION AND EVACUATION;  Surgeon: Lavina Hamman, MD;  Location: [facility];  Service: Gynecology;  Laterality: N/A;  . Tonsillectomy    . Tonsillectomy and adenoidectomy N/A 12/20/2014    Procedure: CONTROL TONSIL  BLEED;  Surgeon: Flo Shanks, MD;  [facility];  Service: ENT;  Laterality: N/A;   Family History  Problem Relation Age of Onset  . Hypertension Mother   . Hypertension Father   . Diabetes Father   . Vision loss Father    Social History  Substance Use Topics  . Smoking status: Current Some Day Smoker    Types: Cigars  . Smokeless tobacco: Never Used  . Alcohol Use: Yes     Comment: occ   OB History    Gravida Para Term Preterm AB TAB SAB Ectopic Multiple Living   0  0     Review of Systems  All other systems reviewed and are negative.  Allergies  Flagyl; Darvocet; and Latex  Home Medications   Prior to Admission medications   Medication Sig Start Date End Date Taking? Authorizing Provider  amoxicillin (AMOXIL) 400 MG/5ML suspension Take 6 mLs by mouth 2 (two)  times daily. 12/20/14  Yes Historical Provider, MD  busPIRone (BUSPAR) 10 MG tablet Take 10 mg by mouth 2 (two) times daily. 11/22/14  Yes Historical Provider, MD  ibuprofen (ADVIL,MOTRIN) 800 MG tablet Take 800 mg by mouth every 8 (eight) hours as needed for headache.   Yes Historical Provider, MD  loratadine (CLARITIN) 10 MG tablet Take 10 mg by mouth daily.   Yes Historical Provider, MD  sertraline (ZOLOFT) 100 MG tablet Take 50 mg by mouth daily.    Yes Historical Provider, MD   BP 124/96 mmHg  Pulse 80  Temp(Src) 97.8 F (36.6 C) (Oral)  Resp 16  SpO2 100%  LMP 11/27/2014   Physical Exam  Constitutional: She is oriented to person, place, and time.  She appears well-developed and well-nourished. No distress.  HENT:  Head: Normocephalic.  Clot to the right tonsillar pillar  No active bleeding Remainder of the mouth shows post tonsillectomy scarring Oropharynx clear with no signs of edema or infection    Eyes: Conjunctivae are normal.  Cardiovascular: Normal rate.   Pulmonary/Chest: Effort normal.  Abdominal: She exhibits no distension.  Neurological: She is alert and oriented to person, place, and time.  Skin: Skin is warm and dry.  Psychiatric: She has a normal mood and affect.  Nursing note and vitals reviewed.   ED Course  Procedures  DIAGNOSTIC STUDIES:  Oxygen Saturation is 100% on RA, normal by my interpretation.  COORDINATION OF CARE:  10:14 PM Advise pt to return to ED upon infection or recurrence of bleeding in the area. Discussed treatment plan with pt at bedside and pt agreed to plan.   Labs Review Labs Reviewed  COMPREHENSIVE METABOLIC PANEL  CBC    Imaging Review No results found. I have personally reviewed and evaluated these lab results as part of my medical decision-making.   MDM   Final diagnoses:  Post-tonsillectomy hemorrhage, subsequent encounter    Labs: Comprehensive metabolic panel, CBC- no significant findings  Imaging:  Consults:  Therapeutics:  Discharge Meds:  Assessment/plan: Patient presents with bleeding post tonsillectomy. She was seen on the first for the same. Patient reports today's symptoms are very mild compared to initial encounter which prompted her to come to the emergency room in the event symptoms worsen. At the time of my evaluation patient had been waiting in the waiting room for a a significant amount of time with no active bleeding. On exam she does have a clot in place with no bleeding. Patient will be instructed to return home monitor for return of bleeding, if presents to return to the emergency room for evaluation. Patient was very agreeable to this plan, and  she also has contact information for the ENT specialist to follow-up first thing Monday morning for reevaluation. Patient verbalized understanding and agreement for today's plan and had no further questions or concerns at the time of discharge.   I personally performed the services described in this documentation, which was scribed in my presence. The recorded information has been reviewed and is accurate.    Eyvonne MechanicJeffrey Ashleigh Arya, PA-C 12/25/14 0109  Pricilla LovelessScott Goldston, MD 12/27/14 [id number]

## 2014-12-30 ENCOUNTER — Emergency Department (HOSPITAL_COMMUNITY): Payer: Commercial Managed Care - HMO | Admitting: Anesthesiology

## 2014-12-30 ENCOUNTER — Encounter (HOSPITAL_COMMUNITY)
Admission: EM | Disposition: A | Discharge status: Home / Self Care | Payer: Self-pay | Source: Home / Self Care | Attending: Emergency Medicine

## 2014-12-30 ENCOUNTER — Encounter (HOSPITAL_COMMUNITY): Payer: Self-pay | Admitting: Emergency Medicine

## 2014-12-30 ENCOUNTER — Ambulatory Visit (HOSPITAL_COMMUNITY)
Admission: EM | Admit: 2014-12-30 | Discharge: 2014-12-30 | Disposition: A | Discharge status: Home / Self Care | Payer: Commercial Managed Care - HMO | Attending: Emergency Medicine | Admitting: Emergency Medicine

## 2014-12-30 DIAGNOSIS — F419 Anxiety disorder, unspecified: Secondary | ICD-10-CM | POA: Diagnosis not present

## 2014-12-30 DIAGNOSIS — F4310 Post-traumatic stress disorder, unspecified: Secondary | ICD-10-CM | POA: Insufficient documentation

## 2014-12-30 DIAGNOSIS — Z79899 Other long term (current) drug therapy: Secondary | ICD-10-CM | POA: Diagnosis not present

## 2014-12-30 DIAGNOSIS — Y836 Removal of other organ (partial) (total) as the cause of abnormal reaction of the patient, or of later complication, without mention of misadventure at the time of the procedure: Secondary | ICD-10-CM | POA: Diagnosis not present

## 2014-12-30 DIAGNOSIS — J358 Other chronic diseases of tonsils and adenoids: Secondary | ICD-10-CM

## 2014-12-30 DIAGNOSIS — F329 Major depressive disorder, single episode, unspecified: Secondary | ICD-10-CM | POA: Diagnosis not present

## 2014-12-30 DIAGNOSIS — J029 Acute pharyngitis, unspecified: Secondary | ICD-10-CM | POA: Diagnosis present

## 2014-12-30 DIAGNOSIS — Z6839 Body mass index (BMI) 39.0-39.9, adult: Secondary | ICD-10-CM | POA: Insufficient documentation

## 2014-12-30 DIAGNOSIS — J95830 Postprocedural hemorrhage and hematoma of a respiratory system organ or structure following a respiratory system procedure: Secondary | ICD-10-CM | POA: Diagnosis not present

## 2014-12-30 DIAGNOSIS — F17290 Nicotine dependence, other tobacco product, uncomplicated: Secondary | ICD-10-CM | POA: Insufficient documentation

## 2014-12-30 HISTORY — PX: TONSILLECTOMY: SHX5217

## 2014-12-30 LAB — BASIC METABOLIC PANEL
ANION GAP: 6 (ref 5–15)
BUN: 7 mg/dL (ref 6–20)
CALCIUM: 9.1 mg/dL (ref 8.9–10.3)
CO2: 29 mmol/L (ref 22–32)
Chloride: 104 mmol/L (ref 101–111)
Creatinine, Ser: 0.81 mg/dL (ref 0.44–1.00)
Glucose, Bld: 86 mg/dL (ref 65–99)
Potassium: 4 mmol/L (ref 3.5–5.1)
Sodium: 139 mmol/L (ref 135–145)

## 2014-12-30 LAB — CBC WITH DIFFERENTIAL/PLATELET
BASOS ABS: 0.1 10*3/uL (ref 0.0–0.1)
BASOS PCT: 1 %
Eosinophils Absolute: 0.4 10*3/uL (ref 0.0–0.7)
Eosinophils Relative: 5 %
HEMATOCRIT: 41.8 % (ref 36.0–46.0)
HEMOGLOBIN: 13.9 g/dL (ref 12.0–15.0)
Lymphocytes Relative: 37 %
Lymphs Abs: 2.9 10*3/uL (ref 0.7–4.0)
MCH: 29.4 pg (ref 26.0–34.0)
MCHC: 33.3 g/dL (ref 30.0–36.0)
MCV: 88.4 fL (ref 78.0–100.0)
Monocytes Absolute: 0.5 10*3/uL (ref 0.1–1.0)
Monocytes Relative: 7 %
NEUTROS ABS: 4 10*3/uL (ref 1.7–7.7)
NEUTROS PCT: 50 %
Platelets: 303 10*3/uL (ref 150–400)
RBC: 4.73 MIL/uL (ref 3.87–5.11)
RDW: 13.6 % (ref 11.5–15.5)
WBC: 7.8 10*3/uL (ref 4.0–10.5)

## 2014-12-30 SURGERY — TONSILLECTOMY
Anesthesia: General | Site: Throat

## 2014-12-30 MED ORDER — HYDROMORPHONE HCL 1 MG/ML IJ SOLN
0.2500 mg | INTRAMUSCULAR | Status: DC | PRN
  Administered 2014-12-30 (×2): 0.5 mg via INTRAVENOUS

## 2014-12-30 MED ORDER — DEXAMETHASONE SODIUM PHOSPHATE 4 MG/ML IJ SOLN
INTRAMUSCULAR | Status: DC | PRN
  Administered 2014-12-30: 10 mg via INTRAVENOUS

## 2014-12-30 MED ORDER — ONDANSETRON HCL 4 MG/2ML IJ SOLN
INTRAMUSCULAR | Status: DC | PRN
  Administered 2014-12-30: 4 mg via INTRAVENOUS

## 2014-12-30 MED ORDER — SUCCINYLCHOLINE CHLORIDE 20 MG/ML IJ SOLN
INTRAMUSCULAR | Status: DC | PRN
  Administered 2014-12-30: 120 mg via INTRAVENOUS

## 2014-12-30 MED ORDER — HYDROMORPHONE HCL 1 MG/ML IJ SOLN
INTRAMUSCULAR | Status: AC
  Filled 2014-12-30: qty 1

## 2014-12-30 MED ORDER — LIDOCAINE HCL (CARDIAC) 20 MG/ML IV SOLN
INTRAVENOUS | Status: DC | PRN
  Administered 2014-12-30: 80 mg via INTRAVENOUS

## 2014-12-30 MED ORDER — PROPOFOL 10 MG/ML IV BOLUS
INTRAVENOUS | Status: AC
  Filled 2014-12-30: qty 40

## 2014-12-30 MED ORDER — DIPHENHYDRAMINE HCL 50 MG/ML IJ SOLN
INTRAMUSCULAR | Status: DC | PRN
  Administered 2014-12-30: 12.5 mg via INTRAVENOUS

## 2014-12-30 MED ORDER — MIDAZOLAM HCL 5 MG/5ML IJ SOLN
INTRAMUSCULAR | Status: DC | PRN
  Administered 2014-12-30: 2 mg via INTRAVENOUS

## 2014-12-30 MED ORDER — SODIUM CHLORIDE 0.9 % IV SOLN
1000.0000 mL | Freq: Once | INTRAVENOUS | Status: AC
  Administered 2014-12-30: 1000 mL via INTRAVENOUS

## 2014-12-30 MED ORDER — PROMETHAZINE HCL 25 MG/ML IJ SOLN: 6.2500 mg | INTRAMUSCULAR | Status: DC | PRN

## 2014-12-30 MED ORDER — MIDAZOLAM HCL 2 MG/2ML IJ SOLN
INTRAMUSCULAR | Status: AC
  Filled 2014-12-30: qty 2

## 2014-12-30 MED ORDER — LACTATED RINGERS IV SOLN
INTRAVENOUS | Status: DC | PRN
  Administered 2014-12-30: 05:00:00 via INTRAVENOUS

## 2014-12-30 MED ORDER — PROPOFOL 10 MG/ML IV BOLUS
INTRAVENOUS | Status: DC | PRN
  Administered 2014-12-30: 40 mg via INTRAVENOUS
  Administered 2014-12-30: 200 mg via INTRAVENOUS

## 2014-12-30 MED ORDER — SODIUM CHLORIDE 0.9 % IV SOLN
1000.0000 mL | INTRAVENOUS | Status: DC
  Administered 2014-12-30 (×2): 1000 mL via INTRAVENOUS

## 2014-12-30 MED ORDER — FENTANYL CITRATE (PF) 250 MCG/5ML IJ SOLN
INTRAMUSCULAR | Status: AC
  Filled 2014-12-30: qty 5

## 2014-12-30 MED ORDER — FENTANYL CITRATE (PF) 250 MCG/5ML IJ SOLN
INTRAMUSCULAR | Status: DC | PRN
  Administered 2014-12-30: 100 ug via INTRAVENOUS
  Administered 2014-12-30 (×3): 50 ug via INTRAVENOUS

## 2014-12-30 MED ORDER — CEFAZOLIN SODIUM-DEXTROSE 2-3 GM-% IV SOLR
INTRAVENOUS | Status: DC | PRN
  Administered 2014-12-30: 2 g via INTRAVENOUS

## 2014-12-30 SURGICAL SUPPLY — 14 items
COAGULATOR SUCT 6 FR SWTCH (ELECTROSURGICAL) ×1
COAGULATOR SUCT SWTCH 10FR 6 (ELECTROSURGICAL) ×2 IMPLANT
ELECT REM PT RETURN 9FT ADLT (ELECTROSURGICAL)
ELECTRODE REM PT RTRN 9FT ADLT (ELECTROSURGICAL) IMPLANT
GLOVE BIOGEL M 7.0 STRL (GLOVE) ×6 IMPLANT
GLOVE SURG SS PI 7.5 STRL IVOR (GLOVE) ×6 IMPLANT
GOWN STRL REUS W/ TWL LRG LVL3 (GOWN DISPOSABLE) ×2 IMPLANT
GOWN STRL REUS W/TWL LRG LVL3 (GOWN DISPOSABLE) ×4
KIT BASIN OR (CUSTOM PROCEDURE TRAY) ×3 IMPLANT
KIT ROOM TURNOVER OR (KITS) ×3 IMPLANT
NS IRRIG 1000ML POUR BTL (IV SOLUTION) ×3 IMPLANT
PACK SURGICAL SETUP 50X90 (CUSTOM PROCEDURE TRAY) ×3 IMPLANT
PAD ARMBOARD 7.5X6 YLW CONV (MISCELLANEOUS) ×6 IMPLANT
TOWEL OR 17X24 6PK STRL BLUE (TOWEL DISPOSABLE) ×6 IMPLANT

## 2014-12-30 NOTE — Brief Op Note (Signed)
12/30/2014  5:39 AM  PATIENT:  Taylor Flowers  24 y.o. female  PRE-OPERATIVE DIAGNOSIS:  post tonsillar hemorrhage  POST-OPERATIVE DIAGNOSIS:  post tonsillar hemorrhage  PROCEDURE:  Procedure(s): CONTROL OF POST TONSILLAR BLEED (N/A)  SURGEON:  Surgeon(s) and Role:    * Osborn Cohoavid Makyle Eslick, MD - Primary  PHYSICIAN ASSISTANT:   ASSISTANTS: none   ANESTHESIA:   general  EBL:  Total I/O In: 500 [I.V.:500] Out: 275 [Other:200; Blood:75]  BLOOD ADMINISTERED:none  DRAINS: none   LOCAL MEDICATIONS USED:  NONE  SPECIMEN:  No Specimen  DISPOSITION OF SPECIMEN:  PATHOLOGY  COUNTS:  YES  TOURNIQUET:  * No tourniquets in log *  DICTATION: .Other Dictation: Dictation Number [id number]  PLAN OF CARE: Discharge to home after PACU  PATIENT DISPOSITION:  PACU - hemodynamically stable.   Delay start of Pharmacological VTE agent (>24hrs) due to surgical blood loss or risk of bleeding: yes

## 2014-12-30 NOTE — H&P (Signed)
Taylor Flowers is an 24 y.o. female.   Chief Complaint: tonsil hemorrhage HPI: recurrent post-tonsillectomy hemorrhage   Past Medical History  Diagnosis Date  . Migraines   . History of anemia     no current med.  Marland Kitchen. Spinal headache   . Metacarpal bone fracture 12/2013    left small  . History of MRSA infection 2012    buttock  . Anxiety   . Depression   . PTSD (post-traumatic stress disorder)   . Miscarriage     Past Surgical History  Procedure Laterality Date  . Dilation and evacuation N/A 01/24/2013    Procedure: DILATATION AND EVACUATION;  Surgeon: Catalina AntiguaPeggy Constant, MD;  Location: [facility];  Service: Gynecology;  Laterality: N/A;  . Cervical cerclage N/A 06/13/2013    Procedure: CERCLAGE CERVICAL;  Surgeon: Kathreen CosierBernard A Marshall, MD;  Location: [facility];  Service: Gynecology;  Laterality: N/A;  . Dilation and curettage of uterus N/A 08/23/2013    Procedure: Dilatation and Currettage with Ultrasound Guidance;  Surgeon: Kathreen CosierBernard A Marshall, MD;  Location: [facility];  Service: Gynecology;  Laterality: N/A;  . Dilation and curettage of uterus  06/2007  . Closed reduction metacarpal with percutaneous pinning Left 12/25/2013    Procedure: LEFT SMALL METACARPAL CLOSED REDUCTION PERCUTANEOUS PINNING ;  Surgeon: Betha LoaKevin Kuzma, MD;  Location: [facility];  Service: Orthopedics;  Laterality: Left;  . Dilation and evacuation N/A 09/11/2014    Procedure: DILATATION AND EVACUATION;  Surgeon: Lavina Hammanodd Meisinger, MD;  Location: [facility];  Service: Gynecology;  Laterality: N/A;  . Tonsillectomy    . Tonsillectomy and adenoidectomy N/A 12/20/2014    Procedure: CONTROL TONSIL  BLEED;  Surgeon: Flo ShanksKarol Wolicki, MD;  [facility];  Service: ENT;  Laterality: N/A;    Family History  Problem Relation Age of Onset  . Hypertension Mother   . Hypertension Father   . Diabetes Father   . Vision loss Father    Social History:  reports that she has been smoking Cigars.  She has never used smokeless tobacco. She  reports that she drinks alcohol. She reports that she uses illicit drugs (Marijuana) about 7 times per week.  Allergies:  Allergies  Allergen Reactions  . Flagyl [Metronidazole] Shortness Of Breath  . Darvocet [Propoxyphene N-Acetaminophen] Other (See Comments)    JOINT PAIN  . Latex Rash     (Not in a hospital admission)  No results found for this or any previous visit (from the past 48 hour(s)). No results found.  Review of Systems  Constitutional: Negative.   HENT: Positive for sore throat.   Respiratory: Negative.   Cardiovascular: Negative.   Gastrointestinal: Negative.     Blood pressure 122/92, pulse 99, temperature 98.7 F (37.1 C), temperature source Oral, resp. rate 16, height 6' (1.829 m), weight 131.543 kg (290 lb), last menstrual period 12/29/2014, SpO2 99 %, unknown if currently breastfeeding. Physical Exam  Constitutional: She appears well-developed and well-nourished.  HENT:  Clot in the right tonsil fossa  Neck: Normal range of motion. Neck supple.  Cardiovascular: Normal rate.   Respiratory: Effort normal.  GI: Soft.     Assessment/Plan Adm for control of post-tonsillectomy hemorrhage.  Taylor Flowers, Gottlieb 12/30/2014, 4:26 AM

## 2014-12-30 NOTE — Anesthesia Postprocedure Evaluation (Signed)
Anesthesia Post Note  Patient: Taylor Flowers  Procedure(s) Performed: Procedure(s) (LRB): CONTROL OF POST TONSILLAR BLEED (N/A)  Patient location during evaluation: PACU Anesthesia Type: General Level of consciousness: awake and alert Pain management: pain level controlled Vital Signs Assessment: post-procedure vital signs reviewed and stable Respiratory status: spontaneous breathing and respiratory function stable Cardiovascular status: blood pressure returned to baseline and stable Postop Assessment: no signs of nausea or vomiting Anesthetic complications: no    Last Vitals:  Filed Vitals:   12/30/14 0548 12/30/14 0600  BP: 141/101 140/100  Pulse: 98 92  Temp:    Resp: 15 12    Last Pain:  Filed Vitals:   12/30/14 0603  PainSc: 5                  Brenna Friesenhahn S

## 2014-12-30 NOTE — ED Provider Notes (Signed)
CSN: [id number]     Arrival date & time 12/30/14  0113 History   By signing my name below, I, Arlan Organ, attest that this documentation has been prepared under the direction and in the presence of Dione Booze, MD.  Electronically Signed: Arlan Organ, ED Scribe. 12/30/2014. 3:42 AM.   Chief Complaint  Patient presents with  . Sore Throat   The history is provided by the patient. No language interpreter was used.    HPI Comments: Taylor Flowers is a 24 y.o. female without any pertinent past medical history who presents to the Emergency Department complaining of constant, ongoing bleeding from the throat onset earlier this evening. In addition, pt states she spit out a blood clot at approximately 2:21 AM this morning. Ongoing pain to throat also reports. 2 doses of Percocet and 1 dose of Benadryl attempted prior to arrival without any improvement. No recent fever, chills, nausea, or vomiting. Pt was recently evaluated on 11/30 and 12/1 for same and was admitted/taken to surgery for a tonsillectomy and adenoidectomy. Taylor Flowers denies having any known bleeding issues but admits she hemorrhaged after delivering her last child in 50. No recent follow up with ENT.  PCP: Verlon Au, MD    Past Medical History  Diagnosis Date  . Migraines   . History of anemia     no current med.  Marland Kitchen Spinal headache   . Metacarpal bone fracture 12/2013    left small  . History of MRSA infection 2012    buttock  . Anxiety   . Depression   . PTSD (post-traumatic stress disorder)   . Miscarriage    Past Surgical History  Procedure Laterality Date  . Dilation and evacuation N/A 01/24/2013    Procedure: DILATATION AND EVACUATION;  Surgeon: Catalina Antigua, MD;  Location: [facility];  Service: Gynecology;  Laterality: N/A;  . Cervical cerclage N/A 06/13/2013    Procedure: CERCLAGE CERVICAL;  Surgeon: Kathreen Cosier, MD;  Location: [facility];  Service: Gynecology;  Laterality: N/A;  . Dilation and  curettage of uterus N/A 08/23/2013    Procedure: Dilatation and Currettage with Ultrasound Guidance;  Surgeon: Kathreen Cosier, MD;  Location: [facility];  Service: Gynecology;  Laterality: N/A;  . Dilation and curettage of uterus  06/2007  . Closed reduction metacarpal with percutaneous pinning Left 12/25/2013    Procedure: LEFT SMALL METACARPAL CLOSED REDUCTION PERCUTANEOUS PINNING ;  Surgeon: Betha Loa, MD;  Location: [facility];  Service: Orthopedics;  Laterality: Left;  . Dilation and evacuation N/A 09/11/2014    Procedure: DILATATION AND EVACUATION;  Surgeon: Lavina Hamman, MD;  Location: [facility];  Service: Gynecology;  Laterality: N/A;  . Tonsillectomy    . Tonsillectomy and adenoidectomy N/A 12/20/2014    Procedure: CONTROL TONSIL  BLEED;  Surgeon: Flo Shanks, MD;  Location: [facility] OR;  Service: ENT;  Laterality: N/A;   Family History  Problem Relation Age of Onset  . Hypertension Mother   . Hypertension Father   . Diabetes Father   . Vision loss Father    Social History  Substance Use Topics  . Smoking status: Current Some Day Smoker    Types: Cigars  . Smokeless tobacco: Never Used  . Alcohol Use: Yes     Comment: occ   OB History    Gravida Para Term Preterm AB TAB SAB Ectopic Multiple Living   0  0     Review of  Systems  Constitutional: Negative for fever and chills.  HENT: Positive for sore throat.        Bleeding from throat   Respiratory: Negative for cough and shortness of breath.   Gastrointestinal: Negative for nausea, vomiting and abdominal pain.  Musculoskeletal: Negative for back pain.  Neurological: Negative for headaches.  Psychiatric/Behavioral: Negative for confusion.  All other systems reviewed and are negative.     Allergies  Flagyl; Darvocet; and Latex  Home Medications   Prior to Admission medications   Medication Sig Start Date End Date Taking? Authorizing Provider  amoxicillin (AMOXIL) 400 MG/5ML suspension Take 6  mLs by mouth 2 (two) times daily. 12/20/14   Historical Provider, MD  busPIRone (BUSPAR) 10 MG tablet Take 10 mg by mouth 2 (two) times daily. 11/22/14   Historical Provider, MD  ibuprofen (ADVIL,MOTRIN) 800 MG tablet Take 800 mg by mouth every 8 (eight) hours as needed for headache.    Historical Provider, MD  loratadine (CLARITIN) 10 MG tablet Take 10 mg by mouth daily.    Historical Provider, MD  sertraline (ZOLOFT) 100 MG tablet Take 50 mg by mouth daily.     Historical Provider, MD   Triage Vitals: BP 122/70 mmHg  Pulse 79  Temp(Src) 98.7 F (37.1 C) (Oral)  Resp 18  Ht 6' (1.829 m)  Wt 290 lb (131.543 kg)  BMI 39.32 kg/m2  SpO2 98%  LMP 12/29/2014   Physical Exam  Constitutional: She is oriented to person, place, and time. She appears well-developed and well-nourished. No distress.  HENT:  Head: Normocephalic and atraumatic.  Moderate edema of uvula  Clot present in R tonsillar fossa with small amount of bright red blood coming from fossa  Eyes: EOM are normal. Pupils are equal, round, and reactive to light.  Neck: Normal range of motion. Neck supple. No JVD present.  Cardiovascular: Normal rate, regular rhythm and normal heart sounds.   No murmur heard. Pulmonary/Chest: Effort normal and breath sounds normal. She has no wheezes. She has no rales. She exhibits no tenderness.  Abdominal: Soft. Bowel sounds are normal. She exhibits no distension and no mass. There is no tenderness.  Musculoskeletal: Normal range of motion.  Lymphadenopathy:    She has no cervical adenopathy.  Neurological: She is alert and oriented to person, place, and time. No cranial nerve deficit. She exhibits normal muscle tone. Coordination normal.  Skin: Skin is warm and dry. No rash noted.  Psychiatric: She has a normal mood and affect. Her behavior is normal. Judgment and thought content normal.  Nursing note and vitals reviewed.   ED Course  Procedures (including critical care time)  DIAGNOSTIC  STUDIES: Oxygen Saturation is 98% on RA, Normal by my interpretation.    COORDINATION OF CARE: 3:13 AM- Will consult with ENT. Discussed treatment plan with pt at bedside and pt agreed to plan.     Labs Review Results for orders placed or performed during the hospital encounter of 12/30/14  Basic metabolic panel  Result Value Ref Range   Sodium 139 135 - 145 mmol/L   Potassium 4.0 3.5 - 5.1 mmol/L   Chloride 104 101 - 111 mmol/L   CO2 29 22 - 32 mmol/L   Glucose, Bld 86 65 - 99 mg/dL   BUN 7 6 - 20 mg/dL   Creatinine, Ser 4.540.81 0.44 - 1.00 mg/dL   Calcium 9.1 8.9 - 09.810.3 mg/dL   GFR calc non Af Amer >60 >60 mL/min   GFR calc Af Amer >  60 >60 mL/min   Anion gap 6 5 - 15  CBC with Differential  Result Value Ref Range   WBC 7.8 4.0 - 10.5 K/uL   RBC 4.73 3.87 - 5.11 MIL/uL   Hemoglobin 13.9 12.0 - 15.0 g/dL   HCT 16.1 09.6 - 04.5 %   MCV 88.4 78.0 - 100.0 fL   MCH 29.4 26.0 - 34.0 pg   MCHC 33.3 30.0 - 36.0 g/dL   RDW 40.9 81.1 - 91.4 %   Platelets 303 150 - 400 K/uL   Neutrophils Relative % 50 %   Neutro Abs 4.0 1.7 - 7.7 K/uL   Lymphocytes Relative 37 %   Lymphs Abs 2.9 0.7 - 4.0 K/uL   Monocytes Relative 7 %   Monocytes Absolute 0.5 0.1 - 1.0 K/uL   Eosinophils Relative 5 %   Eosinophils Absolute 0.4 0.0 - 0.7 K/uL   Basophils Relative 1 %   Basophils Absolute 0.1 0.0 - 0.1 K/uL   I have personally reviewed and evaluated these lab results as part of my medical decision-making.  MDM   Final diagnoses:  Tonsillar bleed    Post tonsillectomy bleed from the right tonsillar fossa. Old records are reviewed confirming tonsillectomy on November 30 and taken to the operating room for post tonsillectomy bleed from the right tonsillar fossa on December 1. Also, ED visit on December 5 for post tonsillectomy bleeding. Case is discussed with Dr. Annalee Genta, on-call for ENT who agrees to take the patient back to the OR to try to obtain hemostasis.  I personally performed the  services described in this documentation, which was scribed in my presence. The recorded information has been reviewed and is accurate.      Dione Booze, MD 12/30/14 [phone]

## 2014-12-30 NOTE — Transfer of Care (Signed)
Immediate Anesthesia Transfer of Care Note  Patient: Taylor Flowers  Procedure(s) Performed: Procedure(s): CONTROL OF POST TONSILLAR BLEED (N/A)  Patient Location: PACU  Anesthesia Type:General  Level of Consciousness: sedated  Airway & Oxygen Therapy: Patient Spontanous Breathing  Post-op Assessment: Report given to RN and Post -op Vital signs reviewed and stable  Post vital signs: Reviewed and stable  Last Vitals:  Filed Vitals:   12/30/14 0347 12/30/14 0350  BP: 122/92 122/92  Pulse: 90 99  Temp:    Resp:  16    Complications: No apparent anesthesia complications

## 2014-12-30 NOTE — Anesthesia Procedure Notes (Signed)
Procedure Name: Intubation Date/Time: 12/30/2014 5:11 AM Performed by: Brien MatesMAHONY, Natlie Asfour D Pre-anesthesia Checklist: Patient identified, Emergency Drugs available, Suction available, Patient being monitored and Timeout performed Patient Re-evaluated:Patient Re-evaluated prior to inductionOxygen Delivery Method: Circle system utilized Preoxygenation: Pre-oxygenation with 100% oxygen Intubation Type: IV induction, Rapid sequence and Cricoid Pressure applied Laryngoscope Size: Miller and 2 Grade View: Grade I Tube type: Oral Tube size: 7.0 mm Number of attempts: 1 Airway Equipment and Method: Stylet Placement Confirmation: ETT inserted through vocal cords under direct vision,  positive ETCO2 and breath sounds checked- equal and bilateral Secured at: 22 cm Tube secured with: Tape Dental Injury: Teeth and Oropharynx as per pre-operative assessment

## 2014-12-30 NOTE — ED Notes (Signed)
Pt states she took 2 6191m percoset's and benedryl at 2230. Pt also states she has had some coughing prior to visit.

## 2014-12-30 NOTE — ED Notes (Signed)
Bedside report to or nurse.

## 2014-12-30 NOTE — ED Notes (Signed)
Pt had tonsils removed on nov 30. This is the third time she has returned for bleeding. sts this is worse than the previous 2 visits. Airway intact.

## 2014-12-30 NOTE — Anesthesia Preprocedure Evaluation (Signed)
Anesthesia Evaluation  Patient identified by MRN, date of birth, ID band Patient awake    Reviewed: Allergy & Precautions, NPO status , Patient's Chart, lab work & pertinent test results  Airway Mallampati: II  TM Distance: >3 FB Neck ROM: Full    Dental no notable dental hx.    Pulmonary Current Smoker,    Pulmonary exam normal breath sounds clear to auscultation       Cardiovascular negative cardio ROS Normal cardiovascular exam Rhythm:Regular Rate:Normal     Neuro/Psych negative neurological ROS  negative psych ROS   GI/Hepatic negative GI ROS, Neg liver ROS,   Endo/Other  Morbid obesity  Renal/GU negative Renal ROS  negative genitourinary   Musculoskeletal negative musculoskeletal ROS (+)   Abdominal   Peds negative pediatric ROS (+)  Hematology negative hematology ROS (+)   Anesthesia Other Findings   Reproductive/Obstetrics negative OB ROS                             Anesthesia Physical Anesthesia Plan  ASA: II and emergent  Anesthesia Plan: General   Post-op Pain Management:    Induction: Intravenous  Airway Management Planned: Oral ETT  Additional Equipment:   Intra-op Plan:   Post-operative Plan: Extubation in OR  Informed Consent: I have reviewed the patients History and Physical, chart, labs and discussed the procedure including the risks, benefits and alternatives for the proposed anesthesia with the patient or authorized representative who has indicated his/her understanding and acceptance.   Dental advisory given  Plan Discussed with: CRNA and Surgeon  Anesthesia Plan Comments:         Anesthesia Quick Evaluation

## 2014-12-30 NOTE — Op Note (Signed)
NAME:  Taylor Flowers, Taylor Flowers NO.:  [id number]  MEDICAL RECORD NO.:  [id number]  LOCATION:  [facility]                         FACILITY:  [facility]  PHYSICIAN:  Kinnie Scales. Annalee Genta, M.D.DATE OF BIRTH:  1990-11-15  DATE OF PROCEDURE:  12/30/2014 DATE OF DISCHARGE:                              OPERATIVE REPORT   PREOPERATIVE DIAGNOSIS:  Post tonsillectomy hemorrhage.  POSTOPERATIVE DIAGNOSIS:  Post tonsillectomy hemorrhage.  INDICATION FOR SURGERY:  Post tonsillectomy hemorrhage.  SURGICAL PROCEDURE:  Exploration and control of tonsillar hemorrhage.  ANESTHESIA:  General endotracheal.  SURGEON:  Kinnie Scales. Annalee Genta, M.D.  COMPLICATIONS:  None.  ESTIMATED BLOOD LOSS:  Less than 50 mL.  DISPOSITION:  The patient transferred from the operating room to the recovery room in stable condition.  BRIEF HISTORY:  The patient is a 24 year old black female, who underwent tonsillectomy on December 19, 2014, subsequent to her uneventful tonsillectomy.  She has had several episodes of post tonsillectomy hemorrhage and has required return to the operating room, 1 time for cautery of her tonsillectomy site.  She was stable and doing well with significant improvement in symptoms of discomfort, tolerating normal oral diet.  She noted a sudden sensation of bleeding late in the evening, on December 29, 2014, who presented to the [facility] Emergency Department for evaluation.  The patient had a clot in the right tonsillar fossa and a small amount of active bleeding.  ENT Service was consulted for evaluation.  Given her history and findings, I recommended return to the operating room for examination and cautery of bleeding site.  The risks and benefits were discussed in detail and the patient understood and agreed to our plan for surgery, which is scheduled on an emergency basis at [facility].  DESCRIPTION OF PROCEDURE:  The patient was brought to the operating room, placed  in a supine position on the operating table.  General endotracheal anesthesia was established without difficulty.  A surgical time-out was then performed with correct identification of the patient, and the surgical procedure, with the patient positioned, prepped, and draped.  A Crowe-Davis mouth gag was inserted without difficulty.  There was a moderate amount of clotted blood in the oropharynx, which was thoroughly suctioned.  The patient had an organized clot in the right tonsillar fossa.  This was carefully debrided and there were 1 point hemorrhage in the mid aspect of the healing tonsillar fossa, which was cauterized with suction cautery.  The patient's oral cavity and oropharynx were then thoroughly irrigated and suctioned.  The patient appeared to be healing well, with minimal eschar and healing granulation tissue bilaterally.  The tonsillar fossa were then gently abraded with a dry tonsil sponge.  The mid aspect of the right tonsil was re cauterized.  There was also a small bleeding spot on the left tonsillar fossa, which was cauterized with monopolar suction cautery.  Mouth gag was released and reapplied.  There was no active bleeding.  Orogastric tube was passed and a moderate amount of clotted material was suctioned from the patient's stomach.  Mouth gag was released and removed.  The patient was then awakened from her anesthetic.  She was extubated. There was  no further bleeding.  She was then transferred from the operating room to the recovery room in stable condition.          ______________________________ Kinnie Scalesavid L. Annalee GentaShoemaker, M.D.     DLS/MEDQ  D:  16/10/960412/11/2014  T:  12/30/2014  Job:  [id number]

## 2014-12-31 ENCOUNTER — Encounter (HOSPITAL_COMMUNITY): Payer: Self-pay | Admitting: Otolaryngology

## 2015-01-05 ENCOUNTER — Emergency Department (HOSPITAL_COMMUNITY)
Admission: EM | Admit: 2015-01-05 | Discharge: 2015-01-05 | Disposition: A | Discharge status: Home / Self Care | Payer: Commercial Managed Care - HMO | Attending: Emergency Medicine | Admitting: Emergency Medicine

## 2015-01-05 ENCOUNTER — Encounter (HOSPITAL_COMMUNITY): Payer: Self-pay | Admitting: Emergency Medicine

## 2015-01-05 DIAGNOSIS — F17210 Nicotine dependence, cigarettes, uncomplicated: Secondary | ICD-10-CM | POA: Diagnosis not present

## 2015-01-05 DIAGNOSIS — Z8679 Personal history of other diseases of the circulatory system: Secondary | ICD-10-CM | POA: Diagnosis not present

## 2015-01-05 DIAGNOSIS — Z91040 Latex allergy status: Secondary | ICD-10-CM | POA: Diagnosis not present

## 2015-01-05 DIAGNOSIS — Z862 Personal history of diseases of the blood and blood-forming organs and certain disorders involving the immune mechanism: Secondary | ICD-10-CM | POA: Diagnosis not present

## 2015-01-05 DIAGNOSIS — Z8614 Personal history of Methicillin resistant Staphylococcus aureus infection: Secondary | ICD-10-CM | POA: Diagnosis not present

## 2015-01-05 DIAGNOSIS — J95830 Postprocedural hemorrhage and hematoma of a respiratory system organ or structure following a respiratory system procedure: Secondary | ICD-10-CM | POA: Insufficient documentation

## 2015-01-05 DIAGNOSIS — F419 Anxiety disorder, unspecified: Secondary | ICD-10-CM | POA: Insufficient documentation

## 2015-01-05 DIAGNOSIS — J358 Other chronic diseases of tonsils and adenoids: Secondary | ICD-10-CM

## 2015-01-05 LAB — CBC WITH DIFFERENTIAL/PLATELET
BASOS PCT: 0 %
Basophils Absolute: 0 10*3/uL (ref 0.0–0.1)
EOS ABS: 0.3 10*3/uL (ref 0.0–0.7)
Eosinophils Relative: 4 %
HEMATOCRIT: 34 % — AB (ref 36.0–46.0)
HEMOGLOBIN: 11.3 g/dL — AB (ref 12.0–15.0)
Lymphocytes Relative: 33 %
Lymphs Abs: 2.3 10*3/uL (ref 0.7–4.0)
MCH: 29.1 pg (ref 26.0–34.0)
MCHC: 33.2 g/dL (ref 30.0–36.0)
MCV: 87.6 fL (ref 78.0–100.0)
MONOS PCT: 9 %
Monocytes Absolute: 0.6 10*3/uL (ref 0.1–1.0)
NEUTROS PCT: 54 %
Neutro Abs: 3.8 10*3/uL (ref 1.7–7.7)
Platelets: 251 10*3/uL (ref 150–400)
RBC: 3.88 MIL/uL (ref 3.87–5.11)
RDW: 13.5 % (ref 11.5–15.5)
WBC: 7 10*3/uL (ref 4.0–10.5)

## 2015-01-05 MED ORDER — SODIUM CHLORIDE 0.9 % IV SOLN
20.0000 ug | Freq: Once | INTRAVENOUS | Status: AC
  Administered 2015-01-05: 20 ug via INTRAVENOUS
  Filled 2015-01-05: qty 5

## 2015-01-05 MED ORDER — TRANEXAMIC ACID 1000 MG/10ML IV SOLN
500.0000 mg | Freq: Once | INTRAVENOUS | Status: AC
  Administered 2015-01-05: 500 mg via TOPICAL
  Filled 2015-01-05: qty 10

## 2015-01-05 NOTE — ED Notes (Signed)
Pt stable, ambulatory, states understanding of discharge instructions 

## 2015-01-05 NOTE — ED Notes (Signed)
Pt reports multiple return visits for bleeding s/p tonsillectomy on 11/30.  States she has been bleeding a moderate amount of blood since 8:30pm tonight.

## 2015-01-05 NOTE — ED Provider Notes (Signed)
CSN: [id number]     Arrival date & time 01/05/15  0311 History   First MD Initiated Contact with Patient 01/05/15 0455     Chief Complaint  Patient presents with  . bleeding s/p tonsillectomy      (Consider location/radiation/quality/duration/timing/severity/associated sxs/prior Treatment) HPI  Taylor Flowers is a 24yo female, PMH of tonsillectomy on Nov 30 and von Willebrands disease, presenting today with recurrent tonsillar bleed for the 3rd time.  She had been to the OR 2 times for repeat cauterization which have failed over the next 3 days afterwards.  She began bleeding tonight at work and coughed up a large blood clot.  She presents to the ED for treatment.  She states she is currently having mild active bleeding currently.  She has a follow up appt with ENT.  There are no further complaints.  10 Systems reviewed and are negative for acute change except as noted in the HPI.    Past Medical History  Diagnosis Date  . Migraines   . History of anemia     no current med.  Marland Kitchen Spinal headache   . Metacarpal bone fracture 12/2013    left small  . History of MRSA infection 2012    buttock  . Anxiety   . Depression   . PTSD (post-traumatic stress disorder)   . Miscarriage    Past Surgical History  Procedure Laterality Date  . Dilation and evacuation N/A 01/24/2013    Procedure: DILATATION AND EVACUATION;  Surgeon: Catalina Antigua, MD;  Location: [facility];  Service: Gynecology;  Laterality: N/A;  . Cervical cerclage N/A 06/13/2013    Procedure: CERCLAGE CERVICAL;  Surgeon: Kathreen Cosier, MD;  Location: [facility];  Service: Gynecology;  Laterality: N/A;  . Dilation and curettage of uterus N/A 08/23/2013    Procedure: Dilatation and Currettage with Ultrasound Guidance;  Surgeon: Kathreen Cosier, MD;  Location: [facility];  Service: Gynecology;  Laterality: N/A;  . Dilation and curettage of uterus  06/2007  . Closed reduction metacarpal with percutaneous pinning Left 12/25/2013    Procedure:  LEFT SMALL METACARPAL CLOSED REDUCTION PERCUTANEOUS PINNING ;  Surgeon: Betha Loa, MD;  Location: [facility];  Service: Orthopedics;  Laterality: Left;  . Dilation and evacuation N/A 09/11/2014    Procedure: DILATATION AND EVACUATION;  Surgeon: Lavina Hamman, MD;  Location: [facility];  Service: Gynecology;  Laterality: N/A;  . Tonsillectomy    . Tonsillectomy and adenoidectomy N/A 12/20/2014    Procedure: CONTROL TONSIL  BLEED;  Surgeon: Flo Shanks, MD;  Location: [facility];  Service: ENT;  Laterality: N/A;  . Tonsillectomy N/A 12/30/2014    Procedure: CONTROL OF POST TONSILLAR BLEED;  Surgeon: Osborn Coho, MD;  Location: [facility] OR;  Service: ENT;  Laterality: N/A;   Family History  Problem Relation Age of Onset  . Hypertension Mother   . Hypertension Father   . Diabetes Father   . Vision loss Father    Social History  Substance Use Topics  . Smoking status: Current Some Day Smoker    Types: Cigars  . Smokeless tobacco: Never Used  . Alcohol Use: Yes     Comment: occ   OB History    Gravida Para Term Preterm AB TAB SAB Ectopic Multiple Living   0  0     Review of Systems    Allergies  Flagyl; Darvocet; and Latex  Home Medications   Prior to Admission medications  Medication Sig Start Date End Date Taking? Authorizing Provider  busPIRone (BUSPAR) 10 MG tablet Take 10 mg by mouth 2 (two) times daily. 11/22/14  Yes Historical Provider, MD   BP 125/84 mmHg  Pulse 81  Temp(Src) 98.3 F (36.8 C) (Oral)  Resp 18  Wt 291 lb (131.997 kg)  SpO2 100%  LMP 12/29/2014 Physical Exam  Constitutional: She is oriented to person, place, and time. She appears well-developed and well-nourished. No distress.  HENT:  Head: Normocephalic and atraumatic.  Nose: Nose normal.  Mouth/Throat: Oropharynx is clear and moist. No oropharyngeal exudate.  R tonsillar bed with nice hemostasis. L tonsillar bed with blood clot in place, no active hemorrhage seen.  Eyes:  Conjunctivae and EOM are normal. Pupils are equal, round, and reactive to light. No scleral icterus.  Neck: Normal range of motion. Neck supple. No JVD present. No tracheal deviation present. No thyromegaly present.  Cardiovascular: Normal rate, regular rhythm and normal heart sounds.  Exam reveals no gallop and no friction rub.   No murmur heard. Pulmonary/Chest: Effort normal and breath sounds normal. No respiratory distress. She has no wheezes. She exhibits no tenderness.  Abdominal: Soft. Bowel sounds are normal. She exhibits no distension and no mass. There is no tenderness. There is no rebound and no guarding.  Musculoskeletal: Normal range of motion. She exhibits no edema or tenderness.  Lymphadenopathy:    She has no cervical adenopathy.  Neurological: She is alert and oriented to person, place, and time. No cranial nerve deficit. She exhibits normal muscle tone.  Skin: Skin is warm and dry. No rash noted. No erythema. No pallor.  Nursing note and vitals reviewed.   ED Course  Procedures (including critical care time) Labs Review Labs Reviewed  CBC WITH DIFFERENTIAL/PLATELET    Imaging Review No results found. I have personally reviewed and evaluated these images and lab results as part of my medical decision-making.   EKG Interpretation None      MDM   Final diagnoses:  None    Patient presents to the ED for evaluation of post tonsillar bleeding.  There is no active bleeding on exam.  She has a history of von Villebrands disease, so I think a dose of DDAVP will benefit her.  She was also given a TXA neb for prevention.  Will obtain CBC to evaluate for anemia.    Upon repeat evaluation, she continues to not have any active bleeding.  She is safe for DC.  She was encouraged to go to follow up ENT appt and return for any worsening.  She appears well and in NAD.  VS remain within her normal limits and she is safe for DC.    Tomasita CrumbleAdeleke Sydney Hasten, MD 01/05/15 [phone]

## 2015-01-05 NOTE — Discharge Instructions (Signed)
Tonsillectomy, Adult, Care After [name], be sure to make your follow up appointment with ENT.  For any worsening symptoms, come back to the ED immediately.  Thank you. These instructions give you information on caring for yourself after your procedure. Your doctor may also give you more specific instructions. Call your doctor if you have any problems or questions after your procedure. HOME CARE  Get proper rest and keep your head raised at all times.  Drink lots of fluids.  Take medicines only as told by your doctor.  Eat soft and cold foods, such as ice cream, frozen ice pops, and cold drinks.  Avoid mouthwashes and gargles.  Avoid people with colds and sore throats. GET HELP IF:  Your pain does not go away after you take pain medicine.  You have a rash.  You have a fever.  You feel light-headed or pass out (faint).  You cannot swallow even a little liquid or spit.  Your pee is getting very dark. GET HELP RIGHT AWAY IF:  You have trouble breathing.  You have any allergy problems because of your medicines.  You have increased bleeding, you throw up (vomit), or you cough or spit up bright red blood.   This information is not intended to replace advice given to you by your health care provider. Make sure you discuss any questions you have with your health care provider.   Document Released: 02/07/2010 Document Revised: 05/22/2014 Document Reviewed: 08/02/2012 Elsevier Interactive Patient Education [employer]2016 Elsevier Inc.

## 2015-01-07 ENCOUNTER — Encounter (HOSPITAL_COMMUNITY): Payer: Self-pay | Admitting: Family Medicine

## 2015-01-07 ENCOUNTER — Encounter (HOSPITAL_COMMUNITY)
Admission: EM | Disposition: A | Discharge status: Home / Self Care | Payer: Self-pay | Source: Home / Self Care | Attending: Emergency Medicine

## 2015-01-07 ENCOUNTER — Emergency Department (HOSPITAL_COMMUNITY): Payer: Commercial Managed Care - HMO | Admitting: Anesthesiology

## 2015-01-07 ENCOUNTER — Observation Stay (HOSPITAL_COMMUNITY)
Admission: EM | Admit: 2015-01-07 | Discharge: 2015-01-08 | Disposition: A | Discharge status: Home / Self Care | Payer: Commercial Managed Care - HMO | Attending: Otolaryngology | Admitting: Otolaryngology

## 2015-01-07 ENCOUNTER — Inpatient Hospital Stay: Admit: 2015-01-07 | Payer: Self-pay | Admitting: Otolaryngology

## 2015-01-07 DIAGNOSIS — Y838 Other surgical procedures as the cause of abnormal reaction of the patient, or of later complication, without mention of misadventure at the time of the procedure: Secondary | ICD-10-CM | POA: Diagnosis not present

## 2015-01-07 DIAGNOSIS — Y836 Removal of other organ (partial) (total) as the cause of abnormal reaction of the patient, or of later complication, without mention of misadventure at the time of the procedure: Secondary | ICD-10-CM | POA: Insufficient documentation

## 2015-01-07 DIAGNOSIS — Z720 Tobacco use: Secondary | ICD-10-CM | POA: Diagnosis not present

## 2015-01-07 DIAGNOSIS — J358 Other chronic diseases of tonsils and adenoids: Secondary | ICD-10-CM

## 2015-01-07 DIAGNOSIS — R041 Hemorrhage from throat: Secondary | ICD-10-CM | POA: Diagnosis present

## 2015-01-07 DIAGNOSIS — F17290 Nicotine dependence, other tobacco product, uncomplicated: Secondary | ICD-10-CM | POA: Insufficient documentation

## 2015-01-07 DIAGNOSIS — J95830 Postprocedural hemorrhage and hematoma of a respiratory system organ or structure following a respiratory system procedure: Secondary | ICD-10-CM | POA: Diagnosis not present

## 2015-01-07 DIAGNOSIS — D680 Von Willebrand's disease: Secondary | ICD-10-CM | POA: Diagnosis not present

## 2015-01-07 HISTORY — DX: Anemia, unspecified: D64.9

## 2015-01-07 HISTORY — DX: Von Willebrand disease, unspecified: D68.00

## 2015-01-07 HISTORY — PX: TONSILLECTOMY: SHX5217

## 2015-01-07 HISTORY — DX: Postprocedural hemorrhage of a respiratory system organ or structure following a respiratory system procedure: J95.830

## 2015-01-07 HISTORY — DX: Von Willebrand's disease: D68.0

## 2015-01-07 HISTORY — PX: TONSILLECTOMY: SUR1361

## 2015-01-07 LAB — CBC WITH DIFFERENTIAL/PLATELET
Basophils Absolute: 0 10*3/uL (ref 0.0–0.1)
Basophils Relative: 1 %
Eosinophils Absolute: 0.3 10*3/uL (ref 0.0–0.7)
Eosinophils Relative: 4 %
HEMATOCRIT: 34.4 % — AB (ref 36.0–46.0)
HEMOGLOBIN: 11.2 g/dL — AB (ref 12.0–15.0)
LYMPHS ABS: 2.5 10*3/uL (ref 0.7–4.0)
Lymphocytes Relative: 38 %
MCH: 28.8 pg (ref 26.0–34.0)
MCHC: 32.6 g/dL (ref 30.0–36.0)
MCV: 88.4 fL (ref 78.0–100.0)
MONOS PCT: 7 %
Monocytes Absolute: 0.5 10*3/uL (ref 0.1–1.0)
NEUTROS ABS: 3.3 10*3/uL (ref 1.7–7.7)
NEUTROS PCT: 50 %
Platelets: 272 10*3/uL (ref 150–400)
RBC: 3.89 MIL/uL (ref 3.87–5.11)
RDW: 13.5 % (ref 11.5–15.5)
WBC: 6.6 10*3/uL (ref 4.0–10.5)

## 2015-01-07 LAB — APTT: APTT: 31 s (ref 24–37)

## 2015-01-07 LAB — I-STAT BETA HCG BLOOD, ED (NOT ORDERABLE)

## 2015-01-07 SURGERY — TONSILLECTOMY
Anesthesia: General | Site: Throat

## 2015-01-07 MED ORDER — ANTIHEMOPHILIC FACTOR-VWF 250-600 UNITS IV SOLR
3400.0000 [IU] | Freq: Once | INTRAVENOUS | Status: DC
  Filled 2015-01-07: qty 3400

## 2015-01-07 MED ORDER — MORPHINE SULFATE (PF) 2 MG/ML IV SOLN: 1.0000 mg | INTRAVENOUS | Status: DC | PRN

## 2015-01-07 MED ORDER — OXYCODONE-ACETAMINOPHEN 5-325 MG PO TABS: 1.0000 | ORAL_TABLET | ORAL | Status: DC | PRN

## 2015-01-07 MED ORDER — ENSURE ENLIVE PO LIQD
237.0000 mL | Freq: Two times a day (BID) | ORAL | Status: DC
  Administered 2015-01-08: 237 mL via ORAL

## 2015-01-07 MED ORDER — DESMOPRESSIN ACE SPRAY REFRIG 0.01 % NA SOLN
2.0000 | Freq: Two times a day (BID) | NASAL | Status: DC
  Administered 2015-01-07: 2 via NASAL
  Filled 2015-01-07: qty 5

## 2015-01-07 MED ORDER — DEXTROSE-NACL 5-0.45 % IV SOLN
INTRAVENOUS | Status: DC
  Administered 2015-01-07: 19:00:00 via INTRAVENOUS

## 2015-01-07 MED ORDER — HYDROMORPHONE HCL 1 MG/ML IJ SOLN
0.2500 mg | INTRAMUSCULAR | Status: DC | PRN
  Administered 2015-01-07 (×2): 0.5 mg via INTRAVENOUS

## 2015-01-07 MED ORDER — FENTANYL CITRATE (PF) 250 MCG/5ML IJ SOLN
INTRAMUSCULAR | Status: AC
  Filled 2015-01-07: qty 5

## 2015-01-07 MED ORDER — FENTANYL CITRATE (PF) 100 MCG/2ML IJ SOLN
INTRAMUSCULAR | Status: DC | PRN
  Administered 2015-01-07: 150 ug via INTRAVENOUS
  Administered 2015-01-07 (×2): 50 ug via INTRAVENOUS

## 2015-01-07 MED ORDER — DESMOPRESSIN ACE SPRAY REFRIG 0.01 % NA SOLN
2.0000 | Freq: Once | NASAL | Status: AC
  Administered 2015-01-07: 2 via NASAL
  Filled 2015-01-07: qty 5

## 2015-01-07 MED ORDER — LIDOCAINE HCL (CARDIAC) 20 MG/ML IV SOLN
INTRAVENOUS | Status: AC
  Filled 2015-01-07: qty 5

## 2015-01-07 MED ORDER — PHENYLEPHRINE HCL 10 MG/ML IJ SOLN
INTRAMUSCULAR | Status: DC | PRN
  Administered 2015-01-07: 40 ug via INTRAVENOUS

## 2015-01-07 MED ORDER — SUCCINYLCHOLINE CHLORIDE 20 MG/ML IJ SOLN
INTRAMUSCULAR | Status: DC | PRN
  Administered 2015-01-07: 120 mg via INTRAVENOUS

## 2015-01-07 MED ORDER — BUSPIRONE HCL 10 MG PO TABS
10.0000 mg | ORAL_TABLET | Freq: Two times a day (BID) | ORAL | Status: DC
  Filled 2015-01-07 (×3): qty 1

## 2015-01-07 MED ORDER — ANTIHEMOPHILIC FACTOR-VWF 250-600 UNITS IV SOLR
3114.0000 [IU] | Freq: Once | INTRAVENOUS | Status: AC
  Administered 2015-01-07: 3114 [IU] via INTRAVENOUS
  Filled 2015-01-07: qty 3114

## 2015-01-07 MED ORDER — AMINOCAPROIC ACID SOLUTION 5% (50 MG/ML)
5.0000 mL | ORAL | Status: DC
  Administered 2015-01-07 – 2015-01-08 (×4): 5 mL via ORAL
  Filled 2015-01-07: qty 100

## 2015-01-07 MED ORDER — BOOST / RESOURCE BREEZE PO LIQD
1.0000 | Freq: Three times a day (TID) | ORAL | Status: DC
  Administered 2015-01-07 – 2015-01-08 (×2): 1 via ORAL

## 2015-01-07 MED ORDER — MAGNESIUM HYDROXIDE 400 MG/5ML PO SUSP: 30.0000 mL | Freq: Every day | ORAL | Status: DC | PRN

## 2015-01-07 MED ORDER — CETIRIZINE HCL 5 MG/5ML PO SYRP
5.0000 mg | ORAL_SOLUTION | Freq: Every day | ORAL | Status: DC | PRN
  Filled 2015-01-07: qty 5

## 2015-01-07 MED ORDER — ANTIHEMOPHILIC FACTOR-VWF 250-600 UNITS IV SOLR
3407.0000 [IU] | Freq: Once | INTRAVENOUS | Status: AC
  Administered 2015-01-07: 3407 [IU] via INTRAVENOUS
  Filled 2015-01-07: qty 3407

## 2015-01-07 MED ORDER — PROPOFOL 10 MG/ML IV BOLUS
INTRAVENOUS | Status: DC | PRN
  Administered 2015-01-07: 200 mg via INTRAVENOUS
  Administered 2015-01-07: 50 mg via INTRAVENOUS

## 2015-01-07 MED ORDER — ESMOLOL HCL 100 MG/10ML IV SOLN
INTRAVENOUS | Status: DC | PRN
  Administered 2015-01-07: 30 mg via INTRAVENOUS

## 2015-01-07 MED ORDER — HYDROMORPHONE HCL 1 MG/ML IJ SOLN
INTRAMUSCULAR | Status: AC
  Filled 2015-01-07: qty 1

## 2015-01-07 MED ORDER — ACETAMINOPHEN 160 MG/5ML PO SOLN: 650.0000 mg | ORAL | Status: DC | PRN

## 2015-01-07 MED ORDER — ANTIHEMOPHILIC FACTOR-VWF 250-600 UNITS IV SOLR
3186.0000 [IU] | Freq: Once | INTRAVENOUS | Status: DC
  Filled 2015-01-07: qty 3186

## 2015-01-07 MED ORDER — LIDOCAINE HCL (CARDIAC) 20 MG/ML IV SOLN
INTRAVENOUS | Status: DC | PRN
  Administered 2015-01-07: 30 mg via INTRAVENOUS
  Administered 2015-01-07: 70 mg via INTRAVENOUS

## 2015-01-07 MED ORDER — ONDANSETRON HCL 4 MG/2ML IJ SOLN
4.0000 mg | Freq: Four times a day (QID) | INTRAMUSCULAR | Status: DC | PRN
  Administered 2015-01-07: 4 mg via INTRAVENOUS
  Filled 2015-01-07: qty 2

## 2015-01-07 MED ORDER — PHENYLEPHRINE 40 MCG/ML (10ML) SYRINGE FOR IV PUSH (FOR BLOOD PRESSURE SUPPORT)
PREFILLED_SYRINGE | INTRAVENOUS | Status: AC
  Filled 2015-01-07: qty 10

## 2015-01-07 MED ORDER — ESMOLOL HCL 100 MG/10ML IV SOLN
INTRAVENOUS | Status: AC
  Filled 2015-01-07: qty 10

## 2015-01-07 MED ORDER — 0.9 % SODIUM CHLORIDE (POUR BTL) OPTIME
TOPICAL | Status: DC | PRN
  Administered 2015-01-07: 1000 mL

## 2015-01-07 MED ORDER — LACTATED RINGERS IV SOLN
INTRAVENOUS | Status: DC | PRN
  Administered 2015-01-07 (×2): via INTRAVENOUS

## 2015-01-07 MED ORDER — MIDAZOLAM HCL 2 MG/2ML IJ SOLN
INTRAMUSCULAR | Status: AC
  Filled 2015-01-07: qty 2

## 2015-01-07 MED ORDER — MIDAZOLAM HCL 5 MG/5ML IJ SOLN
INTRAMUSCULAR | Status: DC | PRN
  Administered 2015-01-07: 2 mg via INTRAVENOUS

## 2015-01-07 MED ORDER — ONDANSETRON 4 MG PO TBDP: 4.0000 mg | ORAL_TABLET | Freq: Four times a day (QID) | ORAL | Status: DC | PRN

## 2015-01-07 MED ORDER — ONDANSETRON HCL 4 MG/2ML IJ SOLN
INTRAMUSCULAR | Status: DC | PRN
  Administered 2015-01-07: 4 mg via INTRAVENOUS

## 2015-01-07 MED ORDER — SILVER NITRATE-POT NITRATE 75-25 % EX MISC
CUTANEOUS | Status: DC | PRN
  Administered 2015-01-07: 1

## 2015-01-07 MED ORDER — TRANEXAMIC ACID 650 MG PO TABS
1300.0000 mg | ORAL_TABLET | Freq: Three times a day (TID) | ORAL | Status: DC
  Administered 2015-01-07 – 2015-01-08 (×2): 1300 mg via ORAL
  Filled 2015-01-07 (×5): qty 2

## 2015-01-07 MED ORDER — ZOLPIDEM TARTRATE 5 MG PO TABS: 5.0000 mg | ORAL_TABLET | Freq: Every evening | ORAL | Status: DC | PRN

## 2015-01-07 SURGICAL SUPPLY — 33 items
BLADE SURG 15 STRL LF DISP TIS (BLADE) IMPLANT
BLADE SURG 15 STRL SS (BLADE)
CANISTER SUCTION 2500CC (MISCELLANEOUS) ×3 IMPLANT
CATH ROBINSON RED A/P 10FR (CATHETERS) IMPLANT
CLEANER TIP ELECTROSURG 2X2 (MISCELLANEOUS) ×3 IMPLANT
COAGULATOR SUCT 6 FR SWTCH (ELECTROSURGICAL) ×1
COAGULATOR SUCT SWTCH 10FR 6 (ELECTROSURGICAL) ×2 IMPLANT
DRAPE PROXIMA HALF (DRAPES) IMPLANT
ELECT COATED BLADE 2.86 ST (ELECTRODE) ×3 IMPLANT
ELECT REM PT RETURN 9FT ADLT (ELECTROSURGICAL) ×3
ELECT REM PT RETURN 9FT PED (ELECTROSURGICAL)
ELECTRODE REM PT RETRN 9FT PED (ELECTROSURGICAL) IMPLANT
ELECTRODE REM PT RTRN 9FT ADLT (ELECTROSURGICAL) ×1 IMPLANT
GAUZE SPONGE 4X4 16PLY XRAY LF (GAUZE/BANDAGES/DRESSINGS) ×3 IMPLANT
GLOVE SURG SS PI 7.5 STRL IVOR (GLOVE) ×3 IMPLANT
GOWN STRL REUS W/ TWL LRG LVL3 (GOWN DISPOSABLE) ×1 IMPLANT
GOWN STRL REUS W/TWL LRG LVL3 (GOWN DISPOSABLE) ×2
KIT BASIN OR (CUSTOM PROCEDURE TRAY) ×3 IMPLANT
KIT ROOM TURNOVER OR (KITS) ×3 IMPLANT
NEEDLE HYPO 25GX1X1/2 BEV (NEEDLE) IMPLANT
NS IRRIG 1000ML POUR BTL (IV SOLUTION) ×3 IMPLANT
PACK SURGICAL SETUP 50X90 (CUSTOM PROCEDURE TRAY) ×3 IMPLANT
PAD ARMBOARD 7.5X6 YLW CONV (MISCELLANEOUS) ×6 IMPLANT
PENCIL BUTTON HOLSTER BLD 10FT (ELECTRODE) ×3 IMPLANT
SPECIMEN JAR SMALL (MISCELLANEOUS) IMPLANT
SPONGE TONSIL 1 RF SGL (DISPOSABLE) IMPLANT
SYR BULB 3OZ (MISCELLANEOUS) ×3 IMPLANT
TOWEL OR 17X24 6PK STRL BLUE (TOWEL DISPOSABLE) ×6 IMPLANT
TUBE CONNECTING 12'X1/4 (SUCTIONS) ×1
TUBE CONNECTING 12X1/4 (SUCTIONS) ×2 IMPLANT
TUBE SALEM SUMP 12R W/ARV (TUBING) IMPLANT
WATER STERILE IRR 1000ML POUR (IV SOLUTION) IMPLANT
YANKAUER SUCT BULB TIP NO VENT (SUCTIONS) IMPLANT

## 2015-01-07 NOTE — Transfer of Care (Signed)
Immediate Anesthesia Transfer of Care Note  Patient: Taylor Flowers  Procedure(s) Performed: Procedure(s): CONTROL TONSIL BLEED (N/A)  Patient Location: PACU  Anesthesia Type:General  Level of Consciousness: awake, oriented, sedated, patient cooperative and responds to stimulation  Airway & Oxygen Therapy: Patient Spontanous Breathing and Patient connected to face mask oxygen  Post-op Assessment: Report given to RN, Post -op Vital signs reviewed and stable, Patient moving all extremities and Patient moving all extremities X 4  Post vital signs: Reviewed and stable  Last Vitals:  Filed Vitals:   01/07/15 1330 01/07/15 1345  BP: 108/63 104/82  Pulse: 78 81  Temp:    Resp:      Complications: No apparent anesthesia complications

## 2015-01-07 NOTE — ED Provider Notes (Signed)
CSN: [id number]     Arrival date & time 01/07/15  0800 History   First MD Initiated Contact with Patient 01/07/15 [phone]     Chief Complaint  Patient presents with  . throat bleeding      (Consider location/radiation/quality/duration/timing/severity/associated sxs/prior Treatment) Patient is a 24 y.o. female presenting with wound check. The history is provided by the patient.  Wound Check This is a recurrent (Pt has tonsilectomy on 11/30 and has had recurrent bleeding from surgical site in setting of Von willebrands) problem. The current episode started today. The problem occurs intermittently. The problem has been gradually worsening. Associated symptoms include a sore throat. Pertinent negatives include no abdominal pain, anorexia, arthralgias, change in bowel habit, chest pain, chills, congestion, coughing, diaphoresis, fatigue, fever, headaches, joint swelling, myalgias, nausea, neck pain, numbness, rash, swollen glands, visual change, vomiting or weakness. Nothing aggravates the symptoms. She has tried nothing for the symptoms.    Past Medical History  Diagnosis Date  . Migraines   . History of anemia     no current med.  Marland Kitchen Spinal headache   . Metacarpal bone fracture 12/2013    left small  . History of MRSA infection 2012    buttock  . Anxiety   . Depression   . PTSD (post-traumatic stress disorder)   . Miscarriage    Past Surgical History  Procedure Laterality Date  . Dilation and evacuation N/A 01/24/2013    Procedure: DILATATION AND EVACUATION;  Surgeon: Catalina Antigua, MD;  Location: [facility];  Service: Gynecology;  Laterality: N/A;  . Cervical cerclage N/A 06/13/2013    Procedure: CERCLAGE CERVICAL;  Surgeon: Kathreen Cosier, MD;  Location: [facility];  Service: Gynecology;  Laterality: N/A;  . Dilation and curettage of uterus N/A 08/23/2013    Procedure: Dilatation and Currettage with Ultrasound Guidance;  Surgeon: Kathreen Cosier, MD;  Location: [facility];  Service:  Gynecology;  Laterality: N/A;  . Dilation and curettage of uterus  06/2007  . Closed reduction metacarpal with percutaneous pinning Left 12/25/2013    Procedure: LEFT SMALL METACARPAL CLOSED REDUCTION PERCUTANEOUS PINNING ;  Surgeon: Betha Loa, MD;  Location: [facility];  Service: Orthopedics;  Laterality: Left;  . Dilation and evacuation N/A 09/11/2014    Procedure: DILATATION AND EVACUATION;  Surgeon: Lavina Hamman, MD;  Location: [facility];  Service: Gynecology;  Laterality: N/A;  . Tonsillectomy    . Tonsillectomy and adenoidectomy N/A 12/20/2014    Procedure: CONTROL TONSIL  BLEED;  Surgeon: Flo Shanks, MD;  Location: [facility] OR;  Service: ENT;  Laterality: N/A;  . Tonsillectomy N/A 12/30/2014    Procedure: CONTROL OF POST TONSILLAR BLEED;  Surgeon: Osborn Coho, MD;  Location: [facility] OR;  Service: ENT;  Laterality: N/A;   Family History  Problem Relation Age of Onset  . Hypertension Mother   . Hypertension Father   . Diabetes Father   . Vision loss Father    Social History  Substance Use Topics  . Smoking status: Current Some Day Smoker    Types: Cigars  . Smokeless tobacco: Never Used  . Alcohol Use: Yes     Comment: occ   OB History    Gravida Para Term Preterm AB TAB SAB Ectopic Multiple Living   0  0     Review of Systems  Constitutional: Negative for fever, chills, diaphoresis and fatigue.  HENT: Positive for sore throat. Negative for congestion, dental problem, drooling, facial swelling,  nosebleeds, rhinorrhea and voice change.   Eyes: Negative for pain.  Respiratory: Negative for cough.   Cardiovascular: Negative for chest pain.  Gastrointestinal: Negative for nausea, vomiting, abdominal pain, anorexia and change in bowel habit.  Musculoskeletal: Negative for myalgias, joint swelling, arthralgias and neck pain.  Skin: Negative for rash.  Neurological: Negative for weakness, numbness and headaches.      Allergies  Flagyl; Darvocet; and  Latex  Home Medications   Prior to Admission medications   Medication Sig Start Date End Date Taking? Authorizing Provider  busPIRone (BUSPAR) 10 MG tablet Take 10 mg by mouth 2 (two) times daily. 11/22/14   Historical Provider, MD   BP 129/70 mmHg  Pulse 93  Temp(Src) 98.4 F (36.9 C) (Oral)  Resp 18  SpO2 97%  LMP 12/29/2014 Physical Exam  Constitutional: She is oriented to person, place, and time. She appears well-developed and well-nourished. No distress.  HENT:  Head: Normocephalic and atraumatic.  Mouth/Throat: Mucous membranes are normal. No oral lesions. No trismus in the jaw. Normal dentition. No posterior oropharyngeal edema.    Eyes: Conjunctivae and EOM are normal. Pupils are equal, round, and reactive to light.  Neck: Normal range of motion. Neck supple.  Cardiovascular: Normal rate, regular rhythm, normal heart sounds and intact distal pulses.  Exam reveals no gallop and no friction rub.   No murmur heard. Pulmonary/Chest: Effort normal and breath sounds normal. No respiratory distress. She has no wheezes. She has no rales. She exhibits no tenderness.  Abdominal: Soft. Bowel sounds are normal. She exhibits no distension. There is no tenderness. There is no guarding.  Neurological: She is alert and oriented to person, place, and time.  Skin: Skin is warm and dry. She is not diaphoretic.  Psychiatric: She has a normal mood and affect.    ED Course  Procedures (including critical care time) Labs Review Labs Reviewed  CBC WITH DIFFERENTIAL/PLATELET - Abnormal; Notable for the following:    Hemoglobin 11.2 (*)    HCT 34.4 (*)    All other components within normal limits  APTT  I-STAT BETA HCG BLOOD, ED (NOT ORDERABLE)    Imaging Review No results found. I have personally reviewed and evaluated these images and lab results as part of my medical decision-making.   EKG Interpretation None      MDM   Final diagnoses:  Tonsillar bleed    24 year old  [place] female with past medical history von Willebrand's disease and tonsillectomy on 11:30 present since setting of tonsillar bleed. Patient has been seen multiple times for this since her operation. She has had 3 cauterizations including initial surgery. She reports when she awoke today she noticed bleeding on the left tonsillar area.  On arrival patient was hemodyncamicall stable and did have clotting with mild oozing of blood from site. Due to this CBC was obtained as well as PTT. ENT was consult and further recommendations.  ENT recommended discussion with Heme.  Patient's CBC was not significantly decreased from previous blood counts. I have discussed the case with hematology who recommended humate P at this time. Patient given medicine without combination. Patient will have follow-up with hematology as an outpatient for further management of von Willebrand's.  ENT as seen and evaluate patient. Please see their note for details. Patient taken to the OR for repeat cauterization and stable at time of transfer of care. Patient remained hemodynamically stable while in the emergency department.  Attending has seen and evaluated patient and Dr. Rubin Payor is  in agreement with plan.    Stacy GardnerAndrew Michaelangelo Mittelman, MD 01/07/15 1536  Benjiman CoreNathan Pickering, MD 01/07/15 [phone]

## 2015-01-07 NOTE — Anesthesia Postprocedure Evaluation (Signed)
Anesthesia Post Note  Patient: [name]  Procedure(s) Performed: Procedure(s) (LRB): CONTROL TONSIL BLEED (N/A)  Patient location during evaluation: PACU Anesthesia Type: General Level of consciousness: awake and alert Pain management: pain level controlled Vital Signs Assessment: post-procedure vital signs reviewed and stable Respiratory status: spontaneous breathing, nonlabored ventilation and respiratory function stable Cardiovascular status: blood pressure returned to baseline and stable Postop Assessment: no signs of nausea or vomiting Anesthetic complications: no    Last Vitals:  Filed Vitals:   01/07/15 1600 01/07/15 1615  BP: 111/74 113/76  Pulse: 86 83  Temp:    Resp: 16 16    Last Pain:  Filed Vitals:   01/07/15 1616  PainSc: 2                  Burnell Hurta,W. EDMOND

## 2015-01-07 NOTE — Op Note (Signed)
DATE OF OPERATION: 01/07/2015 Surgeon: Melvenia BeamGore, Madie Cahn Procedure Performed: cautery of post-tonsillectomy bleed  PREOPERATIVE DIAGNOSIS: post-tonsillectomy bleed, von Willebrand's disease POSTOPERATIVE DIAGNOSIS: post-tonsillectomy bleed, von Willebrand's disease SURGEON: Melvenia BeamGore, Amrit Cress ANESTHESIA: General endotracheal.  ESTIMATED BLOOD LOSS: approximately 25mL intraoperatively and another 25mL suctioned from nasopharynx and stomach from pre-op bleeding DRAINS: none SPECIMENS: none INDICATIONS: The patient is a 24yo with a history of tonsillectomy on 12/19/14 and multiple post-tonsillectomy bleeds due to newly diagnosed von Willebrand's disease DESCRIPTION OF OPERATION: The patient was brought to the operating room and was placed in the supine position and was placed under general endotracheal anesthesia by anesthesiology. The bed was turned 90 degrees and the Crowe-Davis mouth retractor was placed over the endotracheal tube and suspended from the Mayo stand. The palate was inspected and palpated and noted to be intact with no submucous cleft. The uvula was normal. Tonsils were surgically absent. The right tonsillar fossa was essentially completely healed. The left tonsillar fossae was nearly healed except for a small vessel with active bleeding at the left superior tonsillar pole.The left superior tonsillar bleed was thoroughly cauterized using the suction Bovie until meticulous hemostasis was achieved. Several small areas of excoriation at the right lower and left mid-fossa were minimally cauterized with silver nitrate. After several minutes and copious saline irrigation hemostasis was noted with no further bleeding. The nasal cavity and oropharynx were irrigated out and then the the nose, oral cavity,  and stomach were suctioned out. The patient was turned back to anesthesia and awakened from anesthesia and extubated without difficulty. The patient tolerated the procedure well with no immediate  complications and was taken to the postoperative recovery area in good condition.   Dr. Melvenia BeamMitchell Evonte Prestage was present and performed the entire procedure. 01/07/2015  3:41 PM Melvenia BeamGore, Tracen Mahler   Plan: patient does not have a ride home until midnight so will admit and patient can be discharged when she has a ride and feels comfortable going home. Prescriptions for tranexamic acid, nasal DDAVP spray, and percocet on chart. Liquid diet for 1 week and follow up in 2 weeks or sooner if needed.

## 2015-01-07 NOTE — ED Notes (Signed)
Pt here for the left side of her throat bleeding. sts she has been here multiple times for the same post tonsillectomy.

## 2015-01-07 NOTE — ED Notes (Signed)
ED resident at bedside.

## 2015-01-07 NOTE — Anesthesia Procedure Notes (Signed)
Procedure Name: Intubation Date/Time: 01/07/2015 2:54 PM Performed by: Wray KearnsFOLEY, Mylan Lengyel A Pre-anesthesia Checklist: Patient identified, Emergency Drugs available, Suction available, Patient being monitored and Timeout performed Patient Re-evaluated:Patient Re-evaluated prior to inductionOxygen Delivery Method: Circle system utilized Preoxygenation: Pre-oxygenation with 100% oxygen Intubation Type: IV induction, Rapid sequence and Cricoid Pressure applied Laryngoscope Size: Mac and 4 Grade View: Grade I Tube type: Oral Tube size: 7.5 mm Number of attempts: 1 Airway Equipment and Method: Stylet Placement Confirmation: ETT inserted through vocal cords under direct vision,  positive ETCO2 and breath sounds checked- equal and bilateral Secured at: 23 cm Tube secured with: Tape Dental Injury: Teeth and Oropharynx as per pre-operative assessment

## 2015-01-07 NOTE — Consult Note (Signed)
Referral MD  Reason for Referral: Tonsillar hemorrhage and possible von Willebrand disease   Chief Complaint  Patient presents with  . throat bleeding   : I had my tonsils out and I have kept bleeding.  HPI: Ms. Escalante is a very charming 24 year old African-American female. She had a tonsillectomy back in late November. After the tonsillectomy, she was back to hospital with a tonsillar fossa hemorrhage.  She said that Dr. Melvenia Beam did lab work on her and found that she had von Willebrand disease. I will have to try and contact his office to get those lab reports.  She has "normal" menstrual cycles. She said that they are heavy the first day.  No one else in the family has any kind of bleeding issues. She has had multiple surgeries in the past. She has had 4 miscarriages which I find to be incredibly interesting.  She is quite tall and she says that most of her family are also tall.  She had a PTT in the ER today which is normal at 31 seconds. No prior PTTs were done.  She has multiple tattoos and piercings but said she never bled from these. She says that there are some nosebleeds and her brothers.  She has no ocular issues. She has no chest pain. She's never had a echocardiogram.  No one in the family has died of an early age because of cardiac issues or bleeding.  We were asked to see her to try to help with any bleeding management issues.  She is very very nice. She does smoke on occasion. She does have a drink on occasion.  She has no children. Again, she has had 4 miscarriages. I think she said that she only had bleeding from one of these.  Overall, her performance status is ECOG 0.               Past Medical History  Diagnosis Date  . Migraines   . History of anemia     no current med.  Marland Kitchen Spinal headache   . Metacarpal bone fracture 12/2013    left small  . History of MRSA infection 2012    buttock  . Anxiety   . Depression   . PTSD  (post-traumatic stress disorder)   . Miscarriage   :  Past Surgical History  Procedure Laterality Date  . Dilation and evacuation N/A 01/24/2013    Procedure: DILATATION AND EVACUATION;  Surgeon: Catalina Antigua, MD;  Location: [facility];  Service: Gynecology;  Laterality: N/A;  . Cervical cerclage N/A 06/13/2013    Procedure: CERCLAGE CERVICAL;  Surgeon: Kathreen Cosier, MD;  Location: [facility];  Service: Gynecology;  Laterality: N/A;  . Dilation and curettage of uterus N/A 08/23/2013    Procedure: Dilatation and Currettage with Ultrasound Guidance;  Surgeon: Kathreen Cosier, MD;  Location: [facility];  Service: Gynecology;  Laterality: N/A;  . Dilation and curettage of uterus  06/2007  . Closed reduction metacarpal with percutaneous pinning Left 12/25/2013    Procedure: LEFT SMALL METACARPAL CLOSED REDUCTION PERCUTANEOUS PINNING ;  Surgeon: Betha Loa, MD;  Location: [facility];  Service: Orthopedics;  Laterality: Left;  . Dilation and evacuation N/A 09/11/2014    Procedure: DILATATION AND EVACUATION;  Surgeon: Lavina Hamman, MD;  Location: [facility];  Service: Gynecology;  Laterality: N/A;  . Tonsillectomy    . Tonsillectomy and adenoidectomy N/A 12/20/2014    Procedure: CONTROL TONSIL  BLEED;  Surgeon: Flo Shanks, MD;  Location: [facility];  Service: ENT;  Laterality: N/A;  . Tonsillectomy N/A 12/30/2014    Procedure: CONTROL OF POST TONSILLAR BLEED;  Surgeon: Osborn Coho, MD;  Location: [facility] OR;  Service: ENT;  Laterality: N/A;  :   Current facility-administered medications:  .  acetaminophen (TYLENOL) solution 650 mg, 650 mg, Oral, Q4H PRN, Melvenia Beam, MD .  aminocaproic acid (AMICAR) oral solution 50 mg/mL (5%), 100 ml, 5 mL, Oral, Q4H, Melvenia Beam, MD .  antihemophilic factor-vwf (HUMATE-P) injection 3,400 Units, 3,400 Units, Intravenous, Once, Josph Macho, MD .  busPIRone (BUSPAR) tablet 10 mg, 10 mg, Oral, BID, Melvenia Beam, MD .  cetirizine HCl (Zyrtec) 5 MG/5ML  syrup 5 mg, 5 mg, Oral, Daily PRN, Melvenia Beam, MD .  desmopressin (DDAVP) 0.01 % (10 mcg/activation) spray 2 spray, 2 spray, Nasal, BID, Melvenia Beam, MD .  dextrose 5 %-0.45 % sodium chloride infusion, , Intravenous, Continuous, Melvenia Beam, MD, Last Rate: 100 mL/hr at 01/07/15 1854 .  HYDROmorphone (DILAUDID) 1 MG/ML injection, , , ,  .  magnesium hydroxide (MILK OF MAGNESIA) suspension 30 mL, 30 mL, Oral, Daily PRN, Melvenia Beam, MD .  morphine 2 MG/ML injection 1 mg, 1 mg, Intravenous, Q4H PRN, Melvenia Beam, MD .  ondansetron (ZOFRAN-ODT) disintegrating tablet 4 mg, 4 mg, Oral, Q6H PRN **OR** ondansetron (ZOFRAN) injection 4 mg, 4 mg, Intravenous, Q6H PRN, Melvenia Beam, MD .  oxyCODONE-acetaminophen (PERCOCET/ROXICET) 5-325 MG per tablet 1-2 tablet, 1-2 tablet, Oral, Q4H PRN, Melvenia Beam, MD .  tranexamic acid (LYSTEDA) tablet 1,300 mg, 1,300 mg, Oral, TID, Melvenia Beam, MD .  zolpidem (AMBIEN) tablet 5 mg, 5 mg, Oral, QHS PRN, Melvenia Beam, MD:  . aminocaproic acid  5 mL Oral Q4H  . antihemophilic factor-vwf  3,400 Units Intravenous Once  . busPIRone  10 mg Oral BID  . desmopressin  2 spray Nasal BID  . HYDROmorphone      . tranexamic acid  1,300 mg Oral TID  :  Allergies  Allergen Reactions  . Flagyl [Metronidazole] Shortness Of Breath  . Darvocet [Propoxyphene N-Acetaminophen] Other (See Comments)    JOINT PAIN  . Latex Rash  :  Family History  Problem Relation Age of Onset  . Hypertension Mother   . Hypertension Father   . Diabetes Father   . Vision loss Father   :  Social History   Social History  . Marital Status: Single    Spouse Name: N/A  . Number of Children: N/A  . Years of Education: N/A   Occupational History  . Not on file.   Social History Main Topics  . Smoking status: Current Some Day Smoker    Types: Cigars  . Smokeless tobacco: Never Used  . Alcohol Use: Yes     Comment: occ  . Drug Use: 7.00 per week    Special: Marijuana  .  Sexual Activity: Yes    Birth Control/ Protection: None   Other Topics Concern  . Not on file   Social History Narrative  :  Pertinent items are noted in HPI.  Exam: Patient Vitals for the past 24 hrs:  BP Temp Temp src Pulse Resp SpO2  01/07/15 1833 104/72 mmHg 98 F (36.7 C) Oral 84 18 100 %  01/07/15 1800 103/60 mmHg 97.2 F (36.2 C) - 76 13 100 %  01/07/15 1745 (!) 101/58 mmHg - - 78 12 100 %  01/07/15 1730 (!) 102/54 mmHg - - 80 13 100 %  01/07/15 1715  105/63 mmHg - - 79 13 100 %  01/07/15 1700 102/61 mmHg - - 87 15 100 %  01/07/15 1645 (!) 89/56 mmHg - - 100 17 100 %  01/07/15 1634 - - - 89 19 100 %  01/07/15 1630 124/84 mmHg - - 87 (!) 21 100 %  01/07/15 1615 113/76 mmHg - - 83 16 100 %  01/07/15 1600 111/74 mmHg - - 86 16 100 %  01/07/15 1545 120/83 mmHg - - (!) 106 17 100 %  01/07/15 1540 120/83 mmHg 97.6 F (36.4 C) - (!) 119 - 100 %  01/07/15 1345 104/82 mmHg - - 81 - 99 %  01/07/15 1330 108/63 mmHg - - 78 - 100 %  01/07/15 1315 (!) 114/54 mmHg - - 86 - 99 %  01/07/15 1300 106/62 mmHg - - 88 - 98 %  01/07/15 1215 113/63 mmHg - - 83 - 100 %  01/07/15 1145 104/60 mmHg - - 82 - 98 %  01/07/15 1130 118/86 mmHg - - 90 - 99 %  01/07/15 1122 122/85 mmHg - - - - -  01/07/15 0811 129/70 mmHg 98.4 F (36.9 C) Oral 93 18 97 %    well-developed and well-nourished Afro-American female in no obvious distress. Head and neck exam shows dry tonsillar fossa. She has cautery marks. No scleral icterus is noted. No adenopathy noted in neck. Lungs are clear. Cardiac exam regular rate and rhythm with no murmurs, rubs or bruits. Abdomen is soft. She has good bowel sounds. She has no fluid waves. There is no palpable liver or spleen tip. Extremities shows no clubbing, cyanosis or edema. She has good range of motion of her joints. She has not double-jointed. Neurological exam is nonfocal. Skin exam shows no rashes, ecchymoses or petechia.    Recent Labs  01/05/15 0530 01/07/15 0854   WBC 7.0 6.6  HGB 11.3* 11.2*  HCT 34.0* 34.4*  PLT 251 272   No results for input(s): NA, K, CL, CO2, GLUCOSE, BUN, CREATININE, CALCIUM in the last 72 hours.  Blood smear review:  None  Pathology: None     Assessment and Plan:  Ms. Onalee HuaDavid is a very charming 24 year old African-American female. She had hemorrhaging in the tonsillar fossa is after a tonsillectomy. She has had multiple surgeries in the past without any bleeding. She's never had any dental extractions.    I must say that in the 25 years that I have done this, I have never seen an African-American with von Willebrand's disease. I am sure that they can happen. The fact that her PTT was normal in the ER is somewhat troublesome.  Again, I have to get the lab results from Dr. Ellyn HackGore's office.  The one thing I do think of given her height is Marfan's syndrome. This is a connective tissue disorder that can lead to increased bleeding. As far as I can tell, she has no other manifestations of this however.  I will go and give her another dose of Humate-P tonight. This is just make sure that adequate hemostasis is given. If she truly has von Willebrand's disease, it will be interesting to see what type she has. If she has type II, then DDAVP probably would not help that much. I don't think she has type IIB as she has normal platelet counts.  This is a fascinating case.  If she truly has von Willebrand's disease, then I want to make sure that in the future, we follow her so that we  can prepare her for any upcoming surgery.  I spent about 45 minutes with her. She is very nice. I answered her questions.  Pete E.

## 2015-01-07 NOTE — Discharge Instructions (Signed)
Plan: patient does not have a ride home until midnight so will admit and patient can be discharged when she has a ride and feels comfortable going home. Prescriptions for tranexamic acid, nasal DDAVP spray, and percocet on chart. Liquid diet for 1 week and follow up in 2 weeks or sooner if needed.

## 2015-01-07 NOTE — ED Notes (Signed)
Surgery at bedside.

## 2015-01-07 NOTE — H&P (Signed)
01/07/2015  Taylor Flowers  PREOPERATIVE HISTORY AND PHYSICAL  CHIEF COMPLAINT: post-tonsillectomy bleed  HISTORY: This is a 24 year old with newly diagnosed von Willebrands disease who presents with her fourth post-tonsillectomy bleed (tonsillectomy was 12/19/2014), her third requiring operative intervention. She has not seen Hematology yet (referral was placed 12/31/14). She started bleeding at 6am and came to ER subsequently. Her hemoglobin/hematocrit are stable from ER visit 01/05/15 that was managed medically. She now presents for cautery of post-tonsillectomy bleed.  Dr. Emeline Darling, [place] Riley has discussed the risks (bleeding, infection, tongue numbness/taste changes, anesthesia risks, etc.), benefits, and alternatives of this procedure. The patient understands the risks and would like to proceed with the procedure. The chances of success of the procedure are >25% and the patient understands this. I personally performed an examination of the patient within 24 hours of the procedure.  PAST MEDICAL HISTORY: Past Medical History  Diagnosis Date  . Migraines   . History of anemia     no current med.  Marland Kitchen Spinal headache   . Metacarpal bone fracture 12/2013    left small  . History of MRSA infection 2012    buttock  . Anxiety   . Depression   . PTSD (post-traumatic stress disorder)   . Miscarriage     PAST SURGICAL HISTORY: Past Surgical History  Procedure Laterality Date  . Dilation and evacuation N/A 01/24/2013    Procedure: DILATATION AND EVACUATION;  Surgeon: Catalina Antigua, MD;  Location: [facility];  Service: Gynecology;  Laterality: N/A;  . Cervical cerclage N/A 06/13/2013    Procedure: CERCLAGE CERVICAL;  Surgeon: Kathreen Cosier, MD;  Location: [facility];  Service: Gynecology;  Laterality: N/A;  . Dilation and curettage of uterus N/A 08/23/2013    Procedure: Dilatation and Currettage with Ultrasound Guidance;  Surgeon: Kathreen Cosier, MD;  Location: [facility];  Service: Gynecology;   Laterality: N/A;  . Dilation and curettage of uterus  06/2007  . Closed reduction metacarpal with percutaneous pinning Left 12/25/2013    Procedure: LEFT SMALL METACARPAL CLOSED REDUCTION PERCUTANEOUS PINNING ;  Surgeon: Betha Loa, MD;  Location: [facility];  Service: Orthopedics;  Laterality: Left;  . Dilation and evacuation N/A 09/11/2014    Procedure: DILATATION AND EVACUATION;  Surgeon: Lavina Hamman, MD;  Location: [facility];  Service: Gynecology;  Laterality: N/A;  . Tonsillectomy    . Tonsillectomy and adenoidectomy N/A 12/20/2014    Procedure: CONTROL TONSIL  BLEED;  Surgeon: Flo Shanks, MD;  [facility];  Service: ENT;  Laterality: N/A;  . Tonsillectomy N/A 12/30/2014    Procedure: CONTROL OF POST TONSILLAR BLEED;  Surgeon: Osborn Coho, MD;  Location: [facility];  Service: ENT;  Laterality: N/A;    MEDICATIONS: No current facility-administered medications on file prior to encounter.   Current Outpatient Prescriptions on File Prior to Encounter  Medication Sig Dispense Refill  . busPIRone (BUSPAR) 10 MG tablet Take 10 mg by mouth 2 (two) times daily.       ALLERGIES: Allergies  Allergen Reactions  . Flagyl [Metronidazole] Shortness Of Breath  . Darvocet [Propoxyphene N-Acetaminophen] Other (See Comments)    JOINT PAIN  . Latex Rash     SOCIAL HISTORY: Social History   Social History  . Marital Status: Single    Spouse Name: N/A  . Number of Children: N/A  . Years of Education: N/A   Occupational History  . Not on file.   Social History Main Topics  . Smoking status: Current Some Day Smoker  Types: Cigars  . Smokeless tobacco: Never Used  . Alcohol Use: Yes     Comment: occ  . Drug Use: 7.00 per week    Special: Marijuana  . Sexual Activity: Yes    Birth Control/ Protection: None   Other Topics Concern  . Not on file   Social History Narrative    FAMILY HISTORY:  Family History  Problem Relation Age of Onset  . Hypertension  Mother   . Hypertension Father   . Diabetes Father   . Vision loss Father     REVIEW OF SYSTEMS:  HEENT: oral bleeding, otherwise negative x 12 systems except per HPI  PHYSICAL EXAM:  GENERAL:  NAD, lying in ER bed VITAL SIGNS:   Filed Vitals:   01/07/15 1145 01/07/15 1215  BP: 104/60 113/63  Pulse: 82 83  Temp:    Resp:     SKIN:  Warm, dry HEENT:  Right tonsillar fossa is hemostatic and completely mucosalized with surgically absent tonsil. Left tonsillar fossa is filled with sessile clot. Uvula is midline, airway is patent, voice is normal with no stridor or stertor.  NECK:  supple  ABDOMEN:  soft MUSCULOSKELETAL: normal strength PSYCH:  Normal affect NEUROLOGIC:  CN 2-12 intact and symmetric  DIAGNOSTIC STUDIES: CBC stable from 12/17 with very mild anemia  ASSESSMENT AND PLAN: Plan to proceed with third control of post-tonsillectomy hemorrhage due to newly diagnosed Von Willebrands disease post-tonsillectomy nearly 3 weeks ago. Patient understands the risks, benefits, and alternatives. Informed written consent signed, witnessed, and on chart. Hematology recommended Humate -P which was ordered in ER.  01/07/2015  12:35 PM Taylor BeamGore, March Joos

## 2015-01-07 NOTE — Anesthesia Preprocedure Evaluation (Addendum)
Anesthesia Evaluation  Patient identified by MRN, date of birth, ID band Patient awake    Reviewed: Allergy & Precautions, H&P , NPO status , Patient's Chart, lab work & pertinent test results  Airway Mallampati: I  TM Distance: >3 FB Neck ROM: Full    Dental no notable dental hx. (+) Teeth Intact, Dental Advisory Given   Pulmonary Current Smoker,    Pulmonary exam normal breath sounds clear to auscultation       Cardiovascular negative cardio ROS   Rhythm:Regular Rate:Normal     Neuro/Psych  Headaches, Anxiety Depression PTSDnegative psych ROS   GI/Hepatic negative GI ROS, Neg liver ROS,   Endo/Other  Morbid obesity  Renal/GU negative Renal ROS  negative genitourinary   Musculoskeletal   Abdominal   Peds  Hematology Von Willebrands dz   Anesthesia Other Findings   Reproductive/Obstetrics negative OB ROS                            Anesthesia Physical Anesthesia Plan  ASA: III  Anesthesia Plan: General   Post-op Pain Management:    Induction: Intravenous, Rapid sequence and Cricoid pressure planned  Airway Management Planned: Oral ETT  Additional Equipment:   Intra-op Plan:   Post-operative Plan: Extubation in OR  Informed Consent: I have reviewed the patients History and Physical, chart, labs and discussed the procedure including the risks, benefits and alternatives for the proposed anesthesia with the patient or authorized representative who has indicated his/her understanding and acceptance.   Dental advisory given  Plan Discussed with: CRNA  Anesthesia Plan Comments:        Anesthesia Quick Evaluation

## 2015-01-08 ENCOUNTER — Encounter (HOSPITAL_COMMUNITY): Payer: Self-pay | Admitting: Otolaryngology

## 2015-01-08 LAB — APTT: aPTT: 30 seconds (ref 24–37)

## 2015-01-08 NOTE — Discharge Summary (Signed)
Physician Discharge Summary  Patient ID: Taylor Flowers MRN: [id number] DOB/AGE: 25/07/1990 25 y.o.  Admit date: 01/07/2015 Discharge date: 01/08/2015  Admission Diagnoses: Post-tonsillectomy hemorrhage, von Willebrand's disease  Discharge Diagnoses:  Active Problems:   Post-tonsillectomy hemorrhage   Discharged Condition: good  Hospital Course: 24 year old female with von Willebrand's disease who underwent tonsillectomy 11/30 and has required additional cautery on two additional occasions.  She presented to the ER with bleeding yet again, this time from the opposite side.  Hematology recommended therapy from medical standpoint.  She was taken to the OR for cautery.  See operative note.  She was observed overnight and had no additional bleeding.  This morning, she is felt stable for discharge.  Consults: Hematology  Significant Diagnostic Studies: None  Treatments: surgery: Cautery of tonsil fossae  Discharge Exam: Blood pressure 111/62, pulse 84, temperature 99.1 F (37.3 C), temperature source Oral, resp. rate 17, last menstrual period 12/29/2014, SpO2 100 %, unknown if currently breastfeeding. General appearance: alert, cooperative and no distress Throat: no bleeding  Disposition: 01-Home or Self Care  Discharge Instructions    Diet - low sodium heart healthy    Complete by:  As directed      Increase activity slowly    Complete by:  As directed             Medication List    TAKE these medications        busPIRone 10 MG tablet  Commonly known as:  BUSPAR  Take 10 mg by mouth 2 (two) times daily.     diphenhydrAMINE 25 MG tablet  Commonly known as:  SOMINEX  Take 75 mg by mouth at bedtime as needed for sleep.     oxyCODONE-acetaminophen 5-325 MG tablet  Commonly known as:  PERCOCET/ROXICET  Take 1 tablet by mouth every 4 (four) hours as needed for severe pain.           Follow-up Information    Follow up with Josph MachoENNEVER,PETER R, MD In 1 week.   Specialty:   Oncology   Why:  Please call to make appointment for further management of Von Willebrands disease   Contact information:   [address], [address] [place] [address] [phone]       Follow up with Melvenia BeamGore, Mitchell, MD. Schedule an appointment as soon as possible for a visit in 2 weeks.   Specialty:  Otolaryngology   Contact information:   [address] [place] [address] [phone]       Signed: Christia ReadingBATES, Bailie Christenbury 01/08/2015, 8:33 AM

## 2015-01-09 LAB — COAG STUDIES INTERP REPORT: PDF Image: 0

## 2015-01-09 LAB — VON WILLEBRAND PANEL
COAGULATION FACTOR VIII: 162 % (ref 57–163)
Ristocetin Co-factor, Plasma: 89 % (ref 50–200)
Von Willebrand Antigen, Plasma: 143 % (ref 50–200)

## 2015-01-21 ENCOUNTER — Encounter: Payer: Self-pay | Admitting: Hematology & Oncology

## 2015-02-11 ENCOUNTER — Encounter: Payer: Self-pay | Admitting: Hematology & Oncology

## 2015-02-11 ENCOUNTER — Other Ambulatory Visit (HOSPITAL_BASED_OUTPATIENT_CLINIC_OR_DEPARTMENT_OTHER): Payer: Self-pay

## 2015-02-11 ENCOUNTER — Ambulatory Visit (HOSPITAL_BASED_OUTPATIENT_CLINIC_OR_DEPARTMENT_OTHER): Payer: Self-pay | Admitting: Hematology & Oncology

## 2015-02-11 ENCOUNTER — Ambulatory Visit: Payer: Self-pay

## 2015-02-11 VITALS — BP 132/88 | HR 65 | Temp 97.7°F | Resp 16 | Ht 72.0 in | Wt 287.0 lb

## 2015-02-11 DIAGNOSIS — N921 Excessive and frequent menstruation with irregular cycle: Secondary | ICD-10-CM

## 2015-02-11 DIAGNOSIS — D6801 Von willebrand disease, type 1: Secondary | ICD-10-CM

## 2015-02-11 DIAGNOSIS — D500 Iron deficiency anemia secondary to blood loss (chronic): Secondary | ICD-10-CM

## 2015-02-11 DIAGNOSIS — D680 Von Willebrand's disease: Secondary | ICD-10-CM

## 2015-02-11 HISTORY — DX: Von willebrand disease, type 1: D68.01

## 2015-02-11 HISTORY — DX: Iron deficiency anemia secondary to blood loss (chronic): D50.0

## 2015-02-11 HISTORY — DX: Excessive and frequent menstruation with irregular cycle: N92.1

## 2015-02-11 HISTORY — DX: Von Willebrand's disease: D68.0

## 2015-02-11 LAB — CMP (CANCER CENTER ONLY)
ALBUMIN: 3.7 g/dL (ref 3.3–5.5)
ALT(SGPT): 14 U/L (ref 10–47)
AST: 19 U/L (ref 11–38)
Alkaline Phosphatase: 54 U/L (ref 26–84)
BUN, Bld: 9 mg/dL (ref 7–22)
CALCIUM: 8.4 mg/dL (ref 8.0–10.3)
CHLORIDE: 102 meq/L (ref 98–108)
CO2: 26 meq/L (ref 18–33)
Creat: 0.9 mg/dl (ref 0.6–1.2)
GLUCOSE: 97 mg/dL (ref 73–118)
POTASSIUM: 3.7 meq/L (ref 3.3–4.7)
Sodium: 138 mEq/L (ref 128–145)
Total Bilirubin: 0.6 mg/dl (ref 0.20–1.60)
Total Protein: 7.5 g/dL (ref 6.4–8.1)

## 2015-02-11 LAB — CBC WITH DIFFERENTIAL (CANCER CENTER ONLY)
BASO#: 0 10*3/uL (ref 0.0–0.2)
BASO%: 0.7 % (ref 0.0–2.0)
EOS ABS: 0.2 10*3/uL (ref 0.0–0.5)
EOS%: 3.6 % (ref 0.0–7.0)
HCT: 34.6 % — ABNORMAL LOW (ref 34.8–46.6)
HGB: 11.2 g/dL — ABNORMAL LOW (ref 11.6–15.9)
LYMPH#: 1.8 10*3/uL (ref 0.9–3.3)
LYMPH%: 31.4 % (ref 14.0–48.0)
MCH: 27.2 pg (ref 26.0–34.0)
MCHC: 32.4 g/dL (ref 32.0–36.0)
MCV: 84 fL (ref 81–101)
MONO#: 0.4 10*3/uL (ref 0.1–0.9)
MONO%: 6.2 % (ref 0.0–13.0)
NEUT#: 3.4 10*3/uL (ref 1.5–6.5)
NEUT%: 58.1 % (ref 39.6–80.0)
PLATELETS: 482 10*3/uL — AB (ref 145–400)
RBC: 4.12 10*6/uL (ref 3.70–5.32)
RDW: 13.5 % (ref 11.1–15.7)
WBC: 5.8 10*3/uL (ref 3.9–10.0)

## 2015-02-11 LAB — CHCC SATELLITE - SMEAR

## 2015-02-11 MED ORDER — DESMOPRESSIN ACETATE 1.5 MG/ML NA SOLN: 1.0000 | Freq: Two times a day (BID) | NASAL | Status: DC | PRN

## 2015-02-11 NOTE — Progress Notes (Signed)
Hematology and Oncology Follow Up Visit  Taylor Flowers [id number] 1990-07-26 24 y.o. 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  Oral iron daily with vitamin C     Interim History:  Taylor Flowers is in for first office visit. Taylor saw her in the hospital in consultation after having her tonsils removed. She's had some bleeding issues. She was seen in the outpatient setting by her ENT specialist. He did a von Willebrand panel and was found to have a low Willebrand antigen.  In the hospital, she got Humate-P. She did well. We actually checked her von Willebrand levels. Her room Willebrand antigen was 143. Her factor VIII was 162. Her PTT was 30 seconds.  She is able to go home. She had some Stimate when she went home. She had a positive bleeding. She does have some heavy cycles. She does chew ice. Taylor suspect that she probably has little bit of iron deficiency. She is working. She's doing well at work.  She's had no nausea or vomiting. There's been no cough. She's had no change in bowel or bladder habits.  She was have some more tattoos. She has piercings. Taylor gave her a prescription for Stimate to take before she has her tattoos. Taylor did this should be helpful for any bleeding.  She's had no fever.  Her appetite has been good.  Overall, her performance status is ECOG is 0.                 Medications: No current outpatient prescriptions on file.  Allergies:  Allergies  Allergen Reactions  . Flagyl [Metronidazole] Shortness Of Breath  . Darvocet [Propoxyphene N-Acetaminophen] Other (See Comments)    JOINT PAIN  . Latex Rash    Past Medical History, Surgical history, Social history, and Family History were reviewed and updated.  Review of Systems: As above  Physical Exam:  height is 6' (1.829 m) and weight is 287 lb (130.182 kg). Her oral  temperature is 97.7 F (36.5 C). Her blood pressure is 132/88 and her pulse is 65. Her respiration is 16.   Wt Readings from Last 3 Encounters:  02/11/15 287 lb (130.182 kg)  01/05/15 291 lb (131.997 kg)  12/30/14 290 lb (131.543 kg)     Well-developed and well-nourished African-American female in no obvious distress. Head and neck exam shows no ocular or oral lesions. She has no petechial hemorrhages. She has no pharyngeal erythema or hemorrhages or hematomas. There is no adenopathy in her neck. Lungs are clear. Cardiac exam regular rate and rhythm with no murmurs, rubs or bruits. Abdomen is soft. She has good bowel sounds. There is no fluid wave. There is no palpable liver or spleen tip. Back exam shows no tenderness over the spine, ribs or hips. Extremity shows no clubbing, cyanosis or edema. Skin exam shows no rashes, ecchymoses or petechia. She has several piercings on her face and on her tongue. No ecchymoses are noted about these. Neurological exam shows no focal neurological deficits.  Lab Results  Component Value Date   WBC 5.8 02/11/2015   HGB 11.2* 02/11/2015   HCT 34.6* 02/11/2015   MCV 84 02/11/2015   PLT 482* 02/11/2015     Chemistry      Component Value Date/Time   NA 138 02/11/2015 1329   NA 139 12/30/2014 0145   K 3.7 02/11/2015 1329  K 4.0 12/30/2014 0145   CL 102 02/11/2015 1329   CL 104 12/30/2014 0145   CO2 26 02/11/2015 1329   CO2 29 12/30/2014 0145   BUN 9 02/11/2015 1329   BUN 7 12/30/2014 0145   CREATININE 0.9 02/11/2015 1329   CREATININE 0.81 12/30/2014 0145      Component Value Date/Time   CALCIUM 8.4 02/11/2015 1329   CALCIUM 9.1 12/30/2014 0145   ALKPHOS 54 02/11/2015 1329   ALKPHOS 72 12/24/2014 1953   AST 19 02/11/2015 1329   AST 17 12/24/2014 1953   ALT 14 02/11/2015 1329   ALT 23 12/24/2014 1953   BILITOT 0.60 02/11/2015 1329   BILITOT 0.5 12/24/2014 1953         Impression and Plan: Taylor Flowers is a 25 year old African-American  female. She had hemorrhaging after tonsillar resection. She had an abnormal bone Willebrand test in the ENT's office. She did well in the hospital left getting Humate -P .  For now, we will just watch her. She'll take oral iron. Taylor Flowers deficiency. Taylor will set her blood the microscope. She has a microcytic red cells. There is hypochromic red cells. She does chew ice. Her platelets are on the high side. Taylor think all this is consistent with iron deficiency. She has heavy cycles.  Taylor will Taylor her back in 3 months. Taylor told her that if she ever needs any surgery, that we'll have to give her some Humate-P before the procedure. This is worked well for her.  Taylor spent about 40 minutes with her. It was very nice to see her again.   Josph Macho, MD 1/23/20172:51 PM

## 2015-02-12 LAB — APTT: APTT: 28 s (ref 24–33)

## 2015-02-12 LAB — VON WILLEBRAND PANEL
FACTOR VIII ACTIVITY: 108 % (ref 57–163)
PDF Image: 0
VON WILLEBRAND FACTOR: 31 % — AB (ref 50–200)
von Willebrand Factor (vWF) Ag: 59 % (ref 50–200)

## 2015-02-12 LAB — RETICULOCYTES: Reticulocyte Count: 1.5 % (ref 0.6–2.6)

## 2015-05-13 ENCOUNTER — Other Ambulatory Visit: Payer: Medicaid Other

## 2015-05-13 ENCOUNTER — Ambulatory Visit: Payer: Medicaid Other | Admitting: Hematology & Oncology

## 2015-05-22 ENCOUNTER — Emergency Department (HOSPITAL_COMMUNITY)
Admission: EM | Admit: 2015-05-22 | Discharge: 2015-05-22 | Disposition: A | Discharge status: Home / Self Care | Payer: Medicaid Other | Attending: Emergency Medicine | Admitting: Emergency Medicine

## 2015-05-22 ENCOUNTER — Encounter (HOSPITAL_COMMUNITY): Payer: Self-pay | Admitting: Emergency Medicine

## 2015-05-22 DIAGNOSIS — Z8659 Personal history of other mental and behavioral disorders: Secondary | ICD-10-CM | POA: Insufficient documentation

## 2015-05-22 DIAGNOSIS — F17210 Nicotine dependence, cigarettes, uncomplicated: Secondary | ICD-10-CM | POA: Insufficient documentation

## 2015-05-22 DIAGNOSIS — W260XXA Contact with knife, initial encounter: Secondary | ICD-10-CM | POA: Insufficient documentation

## 2015-05-22 DIAGNOSIS — Z23 Encounter for immunization: Secondary | ICD-10-CM | POA: Insufficient documentation

## 2015-05-22 DIAGNOSIS — D680 Von Willebrand's disease: Secondary | ICD-10-CM | POA: Insufficient documentation

## 2015-05-22 DIAGNOSIS — Z8742 Personal history of other diseases of the female genital tract: Secondary | ICD-10-CM | POA: Insufficient documentation

## 2015-05-22 DIAGNOSIS — Z8781 Personal history of (healed) traumatic fracture: Secondary | ICD-10-CM | POA: Insufficient documentation

## 2015-05-22 DIAGNOSIS — Y998 Other external cause status: Secondary | ICD-10-CM | POA: Insufficient documentation

## 2015-05-22 DIAGNOSIS — Y9389 Activity, other specified: Secondary | ICD-10-CM | POA: Insufficient documentation

## 2015-05-22 DIAGNOSIS — Y9289 Other specified places as the place of occurrence of the external cause: Secondary | ICD-10-CM | POA: Insufficient documentation

## 2015-05-22 DIAGNOSIS — S61213A Laceration without foreign body of left middle finger without damage to nail, initial encounter: Secondary | ICD-10-CM

## 2015-05-22 DIAGNOSIS — Z8614 Personal history of Methicillin resistant Staphylococcus aureus infection: Secondary | ICD-10-CM | POA: Insufficient documentation

## 2015-05-22 DIAGNOSIS — Z8679 Personal history of other diseases of the circulatory system: Secondary | ICD-10-CM | POA: Insufficient documentation

## 2015-05-22 DIAGNOSIS — Z91040 Latex allergy status: Secondary | ICD-10-CM | POA: Insufficient documentation

## 2015-05-22 MED ORDER — TETANUS-DIPHTH-ACELL PERTUSSIS 5-2.5-18.5 LF-MCG/0.5 IM SUSP
0.5000 mL | Freq: Once | INTRAMUSCULAR | Status: AC
  Administered 2015-05-22: 0.5 mL via INTRAMUSCULAR
  Filled 2015-05-22: qty 0.5
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
## 2015-05-22 NOTE — Discharge Instructions (Signed)
Local wound care with bacitracin and dressing changes twice daily.  Sutures are to be removed in 7 days. Please follow-up with your primary Dr. for this. Return to the emergency department if you develop redness, increased pain, pus draining from the wound, or other new and concerning symptoms.   Laceration Care, Adult A laceration is a cut that goes through all of the layers of the skin and into the tissue that is right under the skin. Some lacerations heal on their own. Others need to be closed with stitches (sutures), staples, skin adhesive strips, or skin glue. Proper laceration care minimizes the risk of infection and helps the laceration to heal better. HOW TO CARE FOR YOUR LACERATION If sutures or staples were used:  Keep the wound clean and dry.  If you were given a bandage (dressing), you should change it at least one time per day or as told by your health care provider. You should also change it if it becomes wet or dirty.  Keep the wound completely dry for the first 24 hours or as told by your health care provider. After that time, you may shower or bathe. However, make sure that the wound is not soaked in water until after the sutures or staples have been removed.  Clean the wound one time each day or as told by your health care provider:  Wash the wound with soap and water.  Rinse the wound with water to remove all soap.  Pat the wound dry with a clean towel. Do not rub the wound.  After cleaning the wound, apply a thin layer of antibiotic ointmentas told by your health care provider. This will help to prevent infection and keep the dressing from sticking to the wound.  Have the sutures or staples removed as told by your health care provider. If skin adhesive strips were used:  Keep the wound clean and dry.  If you were given a bandage (dressing), you should change it at least one time per day or as told by your health care provider. You should also change it if it  becomes dirty or wet.  Do not get the skin adhesive strips wet. You may shower or bathe, but be careful to keep the wound dry.  If the wound gets wet, pat it dry with a clean towel. Do not rub the wound.  Skin adhesive strips fall off on their own. You may trim the strips as the wound heals. Do not remove skin adhesive strips that are still stuck to the wound. They will fall off in time. If skin glue was used:  Try to keep the wound dry, but you may briefly wet it in the shower or bath. Do not soak the wound in water, such as by swimming.  After you have showered or bathed, gently pat the wound dry with a clean towel. Do not rub the wound.  Do not do any activities that will make you sweat heavily until the skin glue has fallen off on its own.  Do not apply liquid, cream, or ointment medicine to the wound while the skin glue is in place. Using those may loosen the film before the wound has healed.  If you were given a bandage (dressing), you should change it at least one time per day or as told by your health care provider. You should also change it if it becomes dirty or wet.  If a dressing is placed over the wound, be careful not to apply tape  directly over the skin glue. Doing that may cause the glue to be pulled off before the wound has healed.  Do not pick at the glue. The skin glue usually remains in place for 5-10 days, then it falls off of the skin. General Instructions  Take over-the-counter and prescription medicines only as told by your health care provider.  If you were prescribed an antibiotic medicine or ointment, take or apply it as told by your doctor. Do not stop using it even if your condition improves.  To help prevent scarring, make sure to cover your wound with sunscreen whenever you are outside after stitches are removed, after adhesive strips are removed, or when glue remains in place and the wound is healed. Make sure to wear a sunscreen of at least 30 SPF.  Do  not scratch or pick at the wound.  Keep all follow-up visits as told by your health care provider. This is important.  Check your wound every day for signs of infection. Watch for:  Redness, swelling, or pain.  Fluid, blood, or pus.  Raise (elevate) the injured area above the level of your heart while you are sitting or lying down, if possible. SEEK MEDICAL CARE IF:  You received a tetanus shot and you have swelling, severe pain, redness, or bleeding at the injection site.  You have a fever.  A wound that was closed breaks open.  You notice a bad smell coming from your wound or your dressing.  You notice something coming out of the wound, such as wood or glass.  Your pain is not controlled with medicine.  You have increased redness, swelling, or pain at the site of your wound.  You have fluid, blood, or pus coming from your wound.  You notice a change in the color of your skin near your wound.  You need to change the dressing frequently due to fluid, blood, or pus draining from the wound.  You develop a new rash.  You develop numbness around the wound. SEEK IMMEDIATE MEDICAL CARE IF:  You develop severe swelling around the wound.  Your pain suddenly increases and is severe.  You develop painful lumps near the wound or on skin that is anywhere on your body.  You have a red streak going away from your wound.  The wound is on your hand or foot and you cannot properly move a finger or toe.  The wound is on your hand or foot and you notice that your fingers or toes look pale or bluish.   This information is not intended to replace advice given to you by your health care provider. Make sure you discuss any questions you have with your health care provider.   Document Released: 01/05/2005 Document Revised: 05/22/2014 Document Reviewed: 01/01/2014 Elsevier Interactive Patient Education [employer].
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
## 2015-07-28 ENCOUNTER — Encounter (HOSPITAL_COMMUNITY): Payer: Self-pay | Admitting: *Deleted

## 2015-07-28 ENCOUNTER — Inpatient Hospital Stay (HOSPITAL_COMMUNITY)
Admission: AD | Admit: 2015-07-28 | Discharge: 2015-07-28 | Disposition: A | Discharge status: Home / Self Care | Payer: Medicaid Other | Source: Ambulatory Visit | Attending: Obstetrics and Gynecology | Admitting: Obstetrics and Gynecology

## 2015-07-28 DIAGNOSIS — F329 Major depressive disorder, single episode, unspecified: Secondary | ICD-10-CM | POA: Insufficient documentation

## 2015-07-28 DIAGNOSIS — Z8614 Personal history of Methicillin resistant Staphylococcus aureus infection: Secondary | ICD-10-CM | POA: Insufficient documentation

## 2015-07-28 DIAGNOSIS — F17210 Nicotine dependence, cigarettes, uncomplicated: Secondary | ICD-10-CM | POA: Insufficient documentation

## 2015-07-28 DIAGNOSIS — N898 Other specified noninflammatory disorders of vagina: Secondary | ICD-10-CM | POA: Insufficient documentation

## 2015-07-28 DIAGNOSIS — R109 Unspecified abdominal pain: Secondary | ICD-10-CM | POA: Insufficient documentation

## 2015-07-28 DIAGNOSIS — R102 Pelvic and perineal pain: Secondary | ICD-10-CM

## 2015-07-28 DIAGNOSIS — G43909 Migraine, unspecified, not intractable, without status migrainosus: Secondary | ICD-10-CM | POA: Insufficient documentation

## 2015-07-28 DIAGNOSIS — D680 Von Willebrand's disease: Secondary | ICD-10-CM | POA: Insufficient documentation

## 2015-07-28 DIAGNOSIS — F4310 Post-traumatic stress disorder, unspecified: Secondary | ICD-10-CM | POA: Insufficient documentation

## 2015-07-28 LAB — WET PREP, GENITAL
Clue Cells Wet Prep HPF POC: NONE SEEN
Sperm: NONE SEEN
TRICH WET PREP: NONE SEEN
YEAST WET PREP: NONE SEEN

## 2015-07-28 LAB — CBC
HCT: 37.8 % (ref 36.0–46.0)
HEMOGLOBIN: 12.5 g/dL (ref 12.0–15.0)
MCH: 27.2 pg (ref 26.0–34.0)
MCHC: 33.1 g/dL (ref 30.0–36.0)
MCV: 82.2 fL (ref 78.0–100.0)
PLATELETS: 309 10*3/uL (ref 150–400)
RBC: 4.6 MIL/uL (ref 3.87–5.11)
RDW: 16.5 % — ABNORMAL HIGH (ref 11.5–15.5)
WBC: 6.4 10*3/uL (ref 4.0–10.5)

## 2015-07-28 LAB — URINALYSIS, ROUTINE W REFLEX MICROSCOPIC
Bilirubin Urine: NEGATIVE
Glucose, UA: NEGATIVE mg/dL
Hgb urine dipstick: NEGATIVE
KETONES UR: NEGATIVE mg/dL
LEUKOCYTES UA: NEGATIVE
NITRITE: NEGATIVE
PH: 6 (ref 5.0–8.0)
Protein, ur: NEGATIVE mg/dL
SPECIFIC GRAVITY, URINE: 1.02 (ref 1.005–1.030)

## 2015-07-28 LAB — POCT PREGNANCY, URINE: PREG TEST UR: NEGATIVE

## 2015-07-28 NOTE — Discharge Instructions (Signed)

## 2015-07-28 NOTE — MAU Note (Signed)
Patient present  C/o yeast infection or BV, hurt with intercourse, abdominal pain mid abdominal pain, LMP 07/15/2015 was much shorter than normal.

## 2015-07-28 NOTE — MAU Provider Note (Signed)
Chief Complaint:  Vaginitis and Possible Pregnancy   First Provider Initiated Contact with Patient 07/28/15 1549     HPI Taylor Flowers is a 25 y.o. [id number] who presents to maternity admissions reporting vaginal irritation and burning.  States is worse with intercourse.  Has some mid-abdominal pain, intermittent, crampy.  .She is Worried it might be from 3 D&Cs she had with miscarriages.  She reports vaginal bleeding, vaginal itching/burning, urinary symptoms, h/a, dizziness, n/v, or fever/chills.     RN Note: Patient present C/o yeast infection or BV, hurt with intercourse, abdominal pain mid abdominal pain, LMP 07/15/2015 was much shorter than normal  Past Medical History: Past Medical History  Diagnosis Date  . Metacarpal bone fracture 12/2013    left small  . History of MRSA infection 2012    buttock  . Anxiety   . Depression   . PTSD (post-traumatic stress disorder)   . Miscarriage   . Anemia   . Von Willebrand disease (HCC)   . Migraines     "a few times/yr" (01/07/2015)  . Spinal headache   . Von Willebrand disease, type 1a (HCC) 02/11/2015  . Iron deficiency anemia due to chronic blood loss 02/11/2015  . Menometrorrhagia 02/11/2015    Past obstetric history: OB History  Gravida Para Term Preterm AB SAB TAB Ectopic Multiple Living  4 1  1 2 2   0  0    # Outcome Date GA Lbr Len/2nd Weight Sex Delivery Anes PTL Lv  4 Preterm 08/23/13 [redacted]w[redacted]d 01:54 / 00:08 14.8 oz (0.42 kg) M Vag-Spont None  ND     Comments: previable  3 SAB 01/24/13 [redacted]w[redacted]d  4.2 oz (0.119 kg) M  None       Comments: incompetent cervix  2 SAB 06/25/07 [redacted]w[redacted]d            Comments: surgery  1 Gravida               Past Surgical History: Past Surgical History  Procedure Laterality Date  . Dilation and evacuation N/A 01/24/2013    Procedure: DILATATION AND EVACUATION;  Surgeon: Catalina Antigua, MD;  Location: [facility];  Service: Gynecology;  Laterality: N/A;  . Cervical cerclage N/A 06/13/2013    Procedure:  CERCLAGE CERVICAL;  Surgeon: Kathreen Cosier, MD;  Location: [facility];  Service: Gynecology;  Laterality: N/A;  . Closed reduction metacarpal with percutaneous pinning Left 12/25/2013    Procedure: LEFT SMALL METACARPAL CLOSED REDUCTION PERCUTANEOUS PINNING ;  Surgeon: Betha Loa, MD;  Location: [facility];  Service: Orthopedics;  Laterality: Left;  . Dilation and evacuation N/A 09/11/2014    Procedure: DILATATION AND EVACUATION;  Surgeon: Lavina Hamman, MD;  Location: [facility];  Service: Gynecology;  Laterality: N/A;  . Tonsillectomy and adenoidectomy N/A 12/20/2014    Procedure: CONTROL TONSIL  BLEED;  Surgeon: Flo Shanks, MD;  Location: [facility] OR;  Service: ENT;  Laterality: N/A;  . Tonsillectomy N/A 12/30/2014    Procedure: CONTROL OF POST TONSILLAR BLEED;  Surgeon: Osborn Coho, MD;  Location: [facility];  Service: ENT;  Laterality: N/A;  . Tonsillectomy  01/07/2015    control of post tonsillar bleed  . Fracture surgery    . Dilation and curettage of uterus N/A 08/23/2013    Procedure: Dilatation and Currettage with Ultrasound Guidance;  Surgeon: Kathreen Cosier, MD;  Location: [facility];  Service: Gynecology;  Laterality: N/A;  . Dilation and curettage of uterus  06/2007  . Tonsillectomy N/A 01/07/2015  Procedure: CONTROL TONSIL BLEED;  Surgeon: Melvenia Beam, MD;  Location: [facility];  Service: ENT;  Laterality: N/A;    Family History: Family History  Problem Relation Age of Onset  . Hypertension Mother   . Hypertension Father   . Diabetes Father   . Vision loss Father     Social History: Social History  Substance Use Topics  . Smoking status: Current Some Day Smoker -- 1 years    Types: Cigars    Last Attempt to Quit: 12/18/2014  . Smokeless tobacco: Never Used  . Alcohol Use: 0.0 oz/week    0 Standard drinks or equivalent per week     Comment: occ     Allergies:  Allergies  Allergen Reactions  . Flagyl [Metronidazole] Shortness Of Breath  . Darvocet  [Propoxyphene N-Acetaminophen] Other (See Comments)    JOINT PAIN  . Latex Rash    Meds:  Prescriptions prior to admission  Medication Sig Dispense Refill Last Dose  . HydrOXYzine HCl (ATARAX PO) Take 2 tablets by mouth as needed.   Past Week at Unknown time  . loratadine (CLARITIN) 10 MG tablet Take 10 mg by mouth daily.   Past Week at Unknown time  . sertraline (ZOLOFT) 100 MG tablet Take 100 mg by mouth 2 (two) times daily.   Past Week at Unknown time  . zolpidem (AMBIEN) 10 MG tablet Take 10 mg by mouth at bedtime as needed for sleep.   07/27/2015 at Unknown time    I have reviewed patient's Past Medical Hx, Surgical Hx, Family Hx, Social Hx, medications and allergies.  ROS:  Review of Systems  Constitutional: Negative for fever and chills.  Gastrointestinal: Positive for abdominal pain. Negative for nausea, vomiting, diarrhea and constipation.  Genitourinary: Negative for dysuria, vaginal bleeding and vaginal discharge.  Musculoskeletal: Negative for back pain.  Neurological: Negative for dizziness.   Other systems negative     Physical Exam  Patient Vitals for the past 24 hrs:  BP Temp Pulse Resp Height Weight  07/28/15 1454 132/79 mmHg 98.5 F (36.9 C) 79 18 6' (1.829 m) 289 lb 1 oz (131.117 kg)   Constitutional: Well-developed, well-nourished female in no acute distress.  Cardiovascular: normal rate and rhythm, no ectopy audible, S1 & S2 heard, no murmur Respiratory: normal effort, no distress. Lungs CTAB with no wheezes or crackles GI: Abd soft, non-tender.  Nondistended.  No rebound, No guarding.  Bowel Sounds audible  MS: Extremities nontender, no edema, normal ROM Neurologic: Alert and oriented x 4.   Grossly nonfocal. GU: Neg CVAT. Skin:  Warm and Dry Psych:  Affect appropriate.  PELVIC EXAM: Cervix pink, visually closed, without lesion, scant white creamy discharge, vaginal walls and external genitalia normal Bimanual exam: Cervix firm, anterior, neg CMT,  uterus nontender, nonenlarged, adnexa without tenderness, enlargement, or mass    Labs: Results for orders placed or performed during the hospital encounter of 07/28/15 (from the past 24 hour(s))  Urinalysis, Routine w reflex microscopic (not at [facility])     Status: None   Collection Time: 07/28/15  2:56 PM  Result Value Ref Range   Color, Urine YELLOW YELLOW   APPearance CLEAR CLEAR   Specific Gravity, Urine 1.020 1.005 - 1.030   pH 6.0 5.0 - 8.0   Glucose, UA NEGATIVE NEGATIVE mg/dL   Hgb urine dipstick NEGATIVE NEGATIVE   Bilirubin Urine NEGATIVE NEGATIVE   Ketones, ur NEGATIVE NEGATIVE mg/dL   Protein, ur NEGATIVE NEGATIVE mg/dL   Nitrite NEGATIVE NEGATIVE  Leukocytes, UA NEGATIVE NEGATIVE  Pregnancy, urine POC     Status: None   Collection Time: 07/28/15  3:02 PM  Result Value Ref Range   Preg Test, Ur NEGATIVE NEGATIVE  No results found for this or any previous visit (from the past 72 hour(s)).   Ref. Range 07/28/2015 16:08  WBC Latest Ref Range: 4.0-10.5 K/uL 6.4  RBC Latest Ref Range: 3.87-5.11 MIL/uL 4.60  Hemoglobin Latest Ref Range: 12.0-15.0 g/dL 40.912.5  HCT Latest Ref Range: 36.0-46.0 % 37.8  MCV Latest Ref Range: 78.0-100.0 fL 82.2  MCH Latest Ref Range: 26.0-34.0 pg 27.2  MCHC Latest Ref Range: 30.0-36.0 g/dL 81.133.1  RDW Latest Ref Range: 11.5-15.5 % 16.5 (H)  Platelets Latest Ref Range: 150-400 K/uL 309    Ref. Range 07/28/2015 15:47  Yeast Wet Prep HPF POC Latest Ref Range: NONE SEEN  NONE SEEN  Trich, Wet Prep Latest Ref Range: NONE SEEN  NONE SEEN  Clue Cells Wet Prep HPF POC Latest Ref Range: NONE SEEN  NONE SEEN  WBC, Wet Prep HPF POC Latest Ref Range: NONE SEEN  FEW (A)      Imaging:  No results found.  MAU Course/MDM: I have ordered labs as follows:  GC/Chlamydia, wet prep, UA Imaging ordered: none Results reviewed.   Consult Dr Senaida Oresichardson.  Discharge home and have her followup in office   Pt stable at time of discharge.  Assessment: Vaginal  discharge Intermittent abdominal pain  Plan: Discharge home Recommend avoid soaps with perfumes.  Push po fluids. High fiber diet. Follow up in office for further evaluation    Medication List    ASK your doctor about these medications        ATARAX PO  Take 2 tablets by mouth as needed.     loratadine 10 MG tablet  Commonly known as:  CLARITIN  Take 10 mg by mouth daily.     sertraline 100 MG tablet  Commonly known as:  ZOLOFT  Take 100 mg by mouth 2 (two) times daily.     zolpidem 10 MG tablet  Commonly known as:  AMBIEN  Take 10 mg by mouth at bedtime as needed for sleep.       Encouraged to return here or to other Urgent Care/ED if she develops worsening of symptoms, increase in pain, fever, or other concerning symptoms.   Wynelle BourgeoisMarie Euell Schiff CNM, MSN Certified Nurse-Midwife 07/28/2015 3:50 PM

## 2015-07-29 LAB — GC/CHLAMYDIA PROBE AMP (~~LOC~~) NOT AT ARMC
Chlamydia: NEGATIVE
NEISSERIA GONORRHEA: NEGATIVE

## 2015-07-30 LAB — HIV ANTIBODY (ROUTINE TESTING W REFLEX): HIV Screen 4th Generation wRfx: NONREACTIVE

## 2015-08-17 ENCOUNTER — Encounter (HOSPITAL_COMMUNITY): Payer: Self-pay

## 2015-08-17 ENCOUNTER — Inpatient Hospital Stay (HOSPITAL_COMMUNITY)
Admission: AD | Admit: 2015-08-17 | Discharge: 2015-08-17 | Disposition: A | Discharge status: Home / Self Care | Payer: Medicaid Other | Source: Ambulatory Visit | Attending: Obstetrics and Gynecology | Admitting: Obstetrics and Gynecology

## 2015-08-17 DIAGNOSIS — R102 Pelvic and perineal pain: Secondary | ICD-10-CM | POA: Insufficient documentation

## 2015-08-17 DIAGNOSIS — N923 Ovulation bleeding: Secondary | ICD-10-CM

## 2015-08-17 DIAGNOSIS — N939 Abnormal uterine and vaginal bleeding, unspecified: Secondary | ICD-10-CM

## 2015-08-17 DIAGNOSIS — N898 Other specified noninflammatory disorders of vagina: Secondary | ICD-10-CM | POA: Insufficient documentation

## 2015-08-17 DIAGNOSIS — Z87891 Personal history of nicotine dependence: Secondary | ICD-10-CM | POA: Insufficient documentation

## 2015-08-17 DIAGNOSIS — R109 Unspecified abdominal pain: Secondary | ICD-10-CM

## 2015-08-17 LAB — CBC
HCT: 37 % (ref 36.0–46.0)
HEMOGLOBIN: 12.3 g/dL (ref 12.0–15.0)
MCH: 27.5 pg (ref 26.0–34.0)
MCHC: 33.2 g/dL (ref 30.0–36.0)
MCV: 82.6 fL (ref 78.0–100.0)
Platelets: 280 10*3/uL (ref 150–400)
RBC: 4.48 MIL/uL (ref 3.87–5.11)
RDW: 15.6 % — AB (ref 11.5–15.5)
WBC: 5.8 10*3/uL (ref 4.0–10.5)

## 2015-08-17 LAB — URINALYSIS, ROUTINE W REFLEX MICROSCOPIC
BILIRUBIN URINE: NEGATIVE
GLUCOSE, UA: NEGATIVE mg/dL
Hgb urine dipstick: NEGATIVE
Ketones, ur: NEGATIVE mg/dL
Leukocytes, UA: NEGATIVE
NITRITE: NEGATIVE
PH: 7 (ref 5.0–8.0)
Protein, ur: NEGATIVE mg/dL
SPECIFIC GRAVITY, URINE: 1.01 (ref 1.005–1.030)

## 2015-08-17 LAB — POCT PREGNANCY, URINE: Preg Test, Ur: NEGATIVE

## 2015-08-17 NOTE — MAU Provider Note (Signed)
Chief Complaint: Vaginal Discharge and Hip Pain   First Provider Initiated Contact with Patient 08/17/15 1643     SUBJECTIVE HPI: Taylor Flowers is a 25 y.o. [id number] female who presents to Maternity Admissions reporting vaginal spotting since 08/12/2015. Was supposed to start her period around that time, the bleeding has not been normal for her. Patient also reports right lower quadrant pain times a few days different than normal menstrual cramps.  Location: [facility] Quality: cramping Severity: mild-mod Duration: 5 days Course: unchanged Timing: intermittent Modifying factors: Improves with ibuprofen  Associated signs and symptoms: Positive for spotting and absence of normal menstrual period this month. Negative for fever, chills, dyspareunia, vaginal discharge, urinary complaints, GI complaints.  Past Medical History:  Diagnosis Date  . Anemia   . Anxiety   . Depression   . History of MRSA infection 2012   buttock  . Iron deficiency anemia due to chronic blood loss 02/11/2015  . Menometrorrhagia 02/11/2015  . Metacarpal bone fracture 12/2013   left small  . Migraines    "a few times/yr" (01/07/2015)  . Miscarriage   . PTSD (post-traumatic stress disorder)   . Spinal headache   . Von Willebrand disease (HCC)   . Von Willebrand disease, type 1a (HCC) 02/11/2015   OB History  Gravida Para Term Preterm AB Living  4 1   1 3  0  SAB TAB Ectopic Multiple Live Births  3   0   1    # Outcome Date GA Lbr Len/2nd Weight Sex Delivery Anes PTL Lv  4 SAB 08/2014             Complications: Missed abortion  3 Preterm 08/23/13 [redacted]w[redacted]d 01:54 / 00:08 14.8 oz (0.42 kg) M Vag-Spont None Y ND     Complications: Cervical incompetence     Birth Comments: previable  2 SAB 01/24/13 [redacted]w[redacted]d  4.2 oz (0.119 kg) M  None       Birth Comments: incompetent cervix  1 SAB 06/25/07 [redacted]w[redacted]d            Birth Comments: surgery     Past Surgical History:  Procedure Laterality Date  . CERVICAL CERCLAGE N/A 06/13/2013    Procedure: CERCLAGE CERVICAL;  Surgeon: Kathreen Cosier, MD;  Location: [facility];  Service: Gynecology;  Laterality: N/A;  . CLOSED REDUCTION METACARPAL WITH PERCUTANEOUS PINNING Left 12/25/2013   Procedure: LEFT SMALL METACARPAL CLOSED REDUCTION PERCUTANEOUS PINNING ;  Surgeon: Betha Loa, MD;  Location:  SURGERY CENTER;  Service: Orthopedics;  Laterality: Left;  . DILATION AND CURETTAGE OF UTERUS N/A 08/23/2013   Procedure: Dilatation and Currettage with Ultrasound Guidance;  Surgeon: Kathreen Cosier, MD;  Location: [facility];  Service: Gynecology;  Laterality: N/A;  . DILATION AND CURETTAGE OF UTERUS  06/2007  . DILATION AND EVACUATION N/A 01/24/2013   Procedure: DILATATION AND EVACUATION;  Surgeon: Catalina Antigua, MD;  Location: [facility];  Service: Gynecology;  Laterality: N/A;  . DILATION AND EVACUATION N/A 09/11/2014   Procedure: DILATATION AND EVACUATION;  Surgeon: Lavina Hamman, MD;  Location: [facility];  Service: Gynecology;  Laterality: N/A;  . FRACTURE SURGERY    . TONSILLECTOMY N/A 12/30/2014   Procedure: CONTROL OF POST TONSILLAR BLEED;  Surgeon: Osborn Coho, MD;  Location: [facility];  Service: ENT;  Laterality: N/A;  . TONSILLECTOMY  01/07/2015   control of post tonsillar bleed  . TONSILLECTOMY N/A 01/07/2015   Procedure: CONTROL TONSIL BLEED;  Surgeon: Melvenia Beam, MD;  Location: [facility] OR;  Service: ENT;  Laterality: N/A;  . TONSILLECTOMY AND ADENOIDECTOMY N/A 12/20/2014   Procedure: CONTROL TONSIL  BLEED;  Surgeon: Flo Shanks, MD;  Location: [facility] OR;  Service: ENT;  Laterality: N/A;   Social History   Social History  . Marital status: Single    Spouse name: N/A  . Number of children: N/A  . Years of education: N/A   Occupational History  . Not on file.   Social History Main Topics  . Smoking status: Former Smoker    Years: 1.00    Types: Cigars  . Smokeless tobacco: Never Used  . Alcohol use 0.0 oz/week     Comment: occ   . Drug use:     Frequency: 7.0 times  per week    Types: Marijuana     Comment: last night  . Sexual activity: Yes    Birth control/ protection: None   Other Topics Concern  . Not on file   Social History Narrative  . No narrative on file   No current facility-administered medications on file prior to encounter.    Current Outpatient Prescriptions on File Prior to Encounter  Medication Sig Dispense Refill  . sertraline (ZOLOFT) 100 MG tablet Take 100 mg by mouth daily as needed (for anxiety).      Allergies  Allergen Reactions  . Flagyl [Metronidazole] Shortness Of Breath  . Darvocet [Propoxyphene N-Acetaminophen] Other (See Comments)    Reaction:  Joint pain   . Latex Rash    I have reviewed the past Medical Hx, Surgical Hx, Social Hx, Allergies and Medications.   Review of Systems  Constitutional: Negative for chills and fever.  Gastrointestinal: Positive for abdominal pain. Negative for abdominal distention, constipation, diarrhea, nausea and vomiting.  Genitourinary: Positive for vaginal bleeding. Negative for dysuria, hematuria, vaginal discharge and vaginal pain.    OBJECTIVE Patient Vitals for the past 24 hrs:  BP Temp Temp src Resp Height Weight  08/17/15 1516 133/87 98.3 F (36.8 C) Oral 16 6' (1.829 m) 285 lb (129.3 kg)   Constitutional: Well-developed, well-nourished female in no acute distress.  Cardiovascular: normal rate Respiratory: normal rate and effort.  GI: Abd soft, non-tender, gravid appropriate for gestational age. Pos BS x 4 MS: Extremities nontender, no edema, normal ROM Neurologic: Alert and oriented x 4.  GU: Neg CVAT.  SPECULUM EXAM: NEFG, physiologic discharge, Scant blood noted, cervix clean  BIMANUAL: cervix closed; uterus normal size, no adnexal tenderness or masses. No CMT.  LAB RESULTS Results for orders placed or performed during the hospital encounter of 08/17/15 (from the past 24 hour(s))  Urinalysis, Routine w reflex microscopic (not at [facility])     Status: None    Collection Time: 08/17/15  3:20 PM  Result Value Ref Range   Color, Urine YELLOW YELLOW   APPearance CLEAR CLEAR   Specific Gravity, Urine 1.010 1.005 - 1.030   pH 7.0 5.0 - 8.0   Glucose, UA NEGATIVE NEGATIVE mg/dL   Hgb urine dipstick NEGATIVE NEGATIVE   Bilirubin Urine NEGATIVE NEGATIVE   Ketones, ur NEGATIVE NEGATIVE mg/dL   Protein, ur NEGATIVE NEGATIVE mg/dL   Nitrite NEGATIVE NEGATIVE   Leukocytes, UA NEGATIVE NEGATIVE  Pregnancy, urine POC     Status: None   Collection Time: 08/17/15  4:23 PM  Result Value Ref Range   Preg Test, Ur NEGATIVE NEGATIVE  CBC     Status: Abnormal   Collection Time: 08/17/15  4:52 PM  Result Value Ref Range   WBC 5.8  4.0 - 10.5 K/uL   RBC 4.48 3.87 - 5.11 MIL/uL   Hemoglobin 12.3 12.0 - 15.0 g/dL   HCT 16.1 09.6 - 04.5 %   MCV 82.6 78.0 - 100.0 fL   MCH 27.5 26.0 - 34.0 pg   MCHC 33.2 30.0 - 36.0 g/dL   RDW 40.9 (H) 81.1 - 91.4 %   Platelets 280 150 - 400 K/uL    IMAGING No results found.  MAU COURSE UPT, UA, CBC. Declined wet prep, GC/chlamydia cultures due to negative result 07/28/2015.  MDM - Absence of normal menstrual period this month with negative urine pregnancy test. Explained that it can take 2 weeks for pregnancy test to show positive result. Recommend she follow-up with her gynecologist as she does not have a normal period in the next month. - Mild left lower quadrant pain well-controlled with ibuprofen, possibly dysmenorrhea.  ASSESSMENT 1. Spotting between menses   2. Abdominal pain in female     PLAN Discharge home in stable condition.  Follow-up Information    [facility] .   Why:  As needed if if you do not get your menstrual period in the next month Contact information: [address]  [address] [place] [address] [phone]            Medication List    TAKE these medications   diphenhydrAMINE 25 MG tablet Commonly known as:  BENADRYL Take 50 mg by mouth at bedtime as needed  for sleep.   ibuprofen 800 MG tablet Commonly known as:  ADVIL,MOTRIN Take 800 mg by mouth every 8 (eight) hours as needed for mild pain or moderate pain.   sertraline 100 MG tablet Commonly known as:  ZOLOFT Take 100 mg by mouth daily as needed (for anxiety).        [place], [place] 08/17/2015  5:43 PM

## 2015-08-17 NOTE — MAU Note (Signed)
Patient presents with brown discharge since the beginning of the week was to start her period, LMP 07/15/15, having pain on the lower right abdominal pain x 1 week, no dysuria, no constipation, tongue feels itchy.

## 2015-08-17 NOTE — Discharge Instructions (Signed)

## 2015-08-18 LAB — URINE CULTURE
Culture: <10000 — AB
SPECIAL REQUESTS: NORMAL

## 2015-10-05 IMAGING — CR DG WRIST COMPLETE 3+V*L*
4 series · 4 of 4 positions shown · non-contrast
Comparison: 12/17/2013.  Hand reported separately.

CLINICAL DATA: LEFT hand surgery, with pain in the wrist.

EXAM:
LEFT WRIST - COMPLETE 3+ VIEW

[wrist pa]
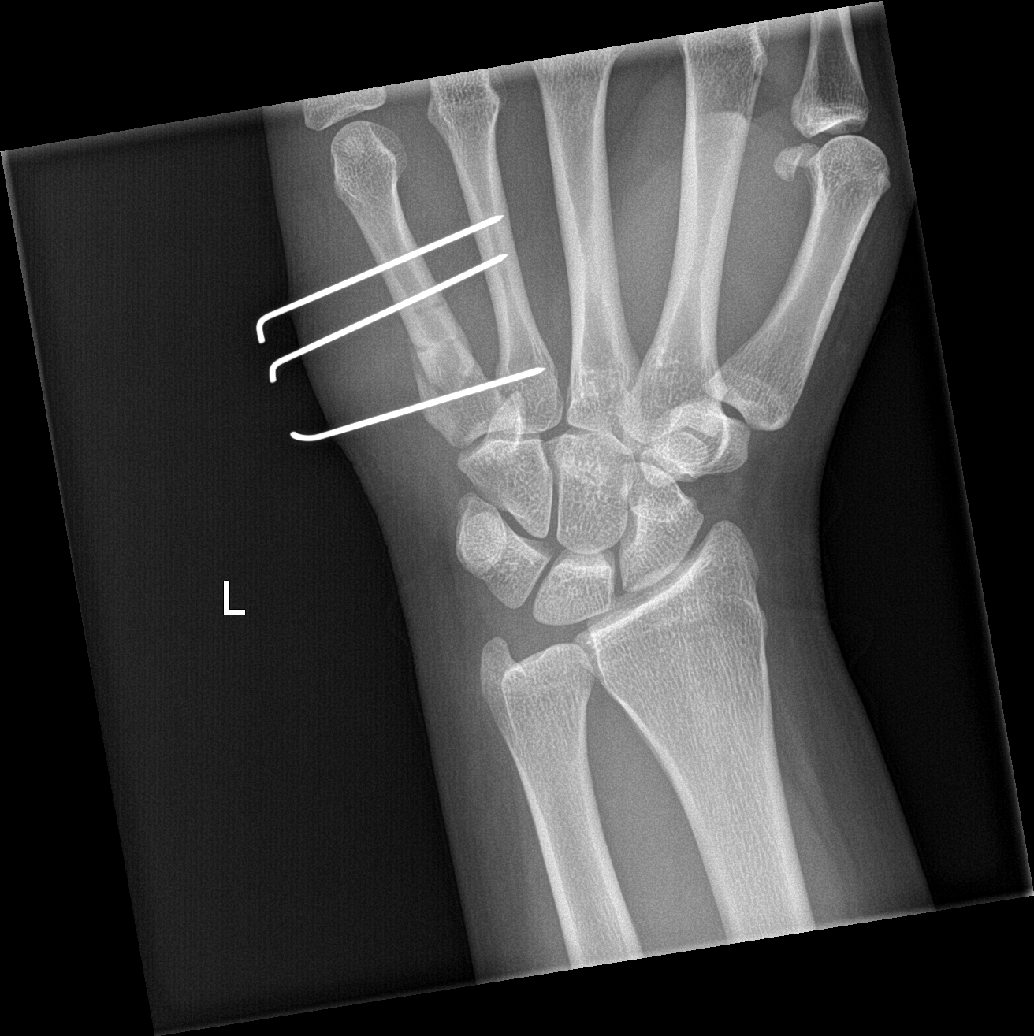

[wrist obl]
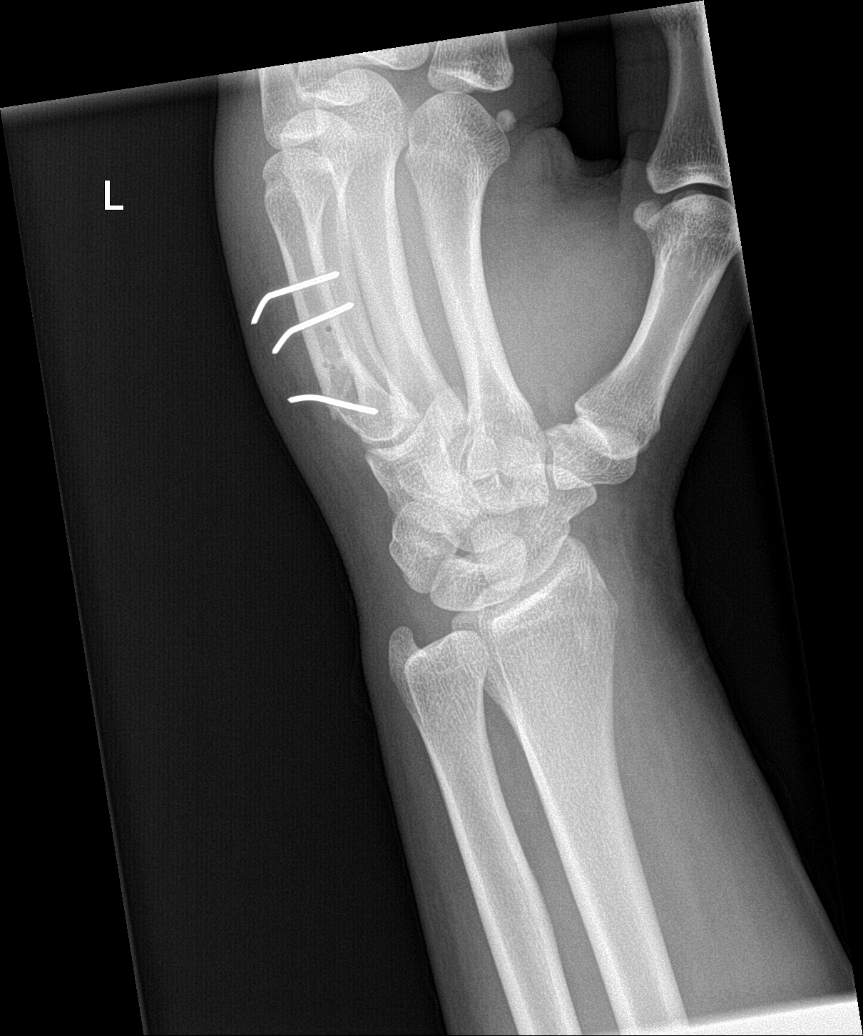

[wrist lat]
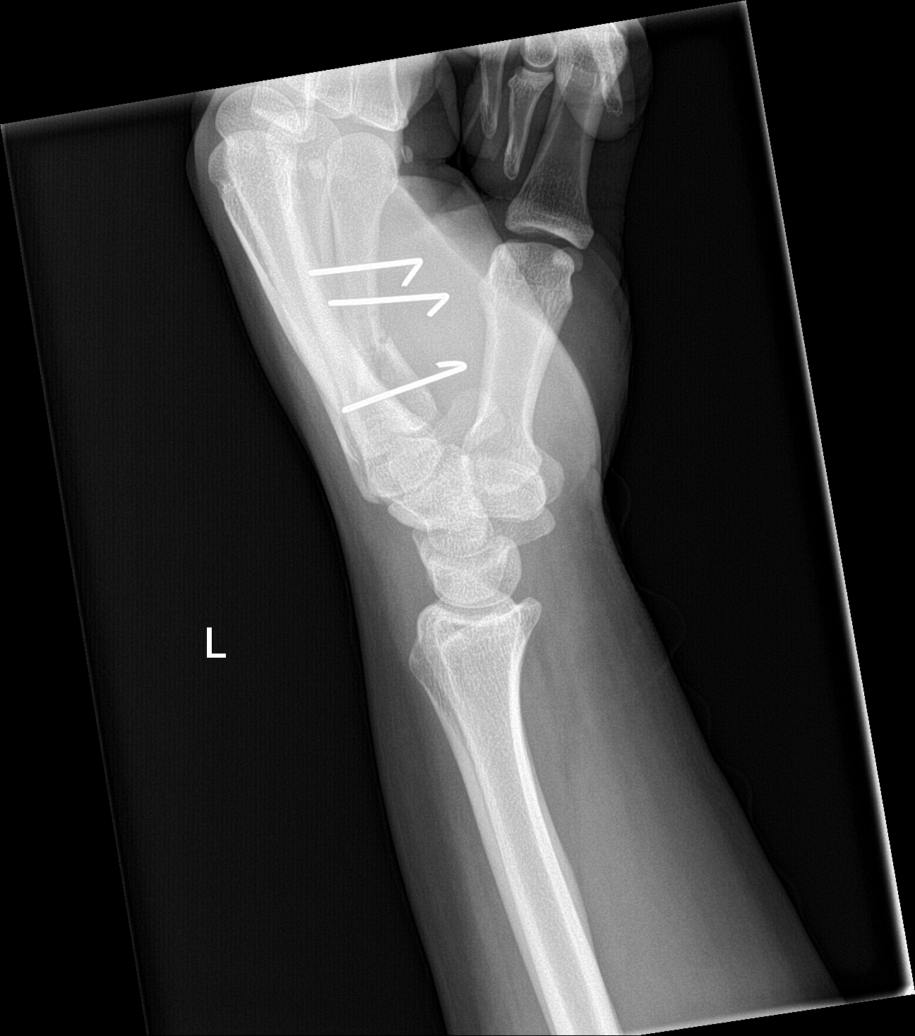

[wrist navicular]
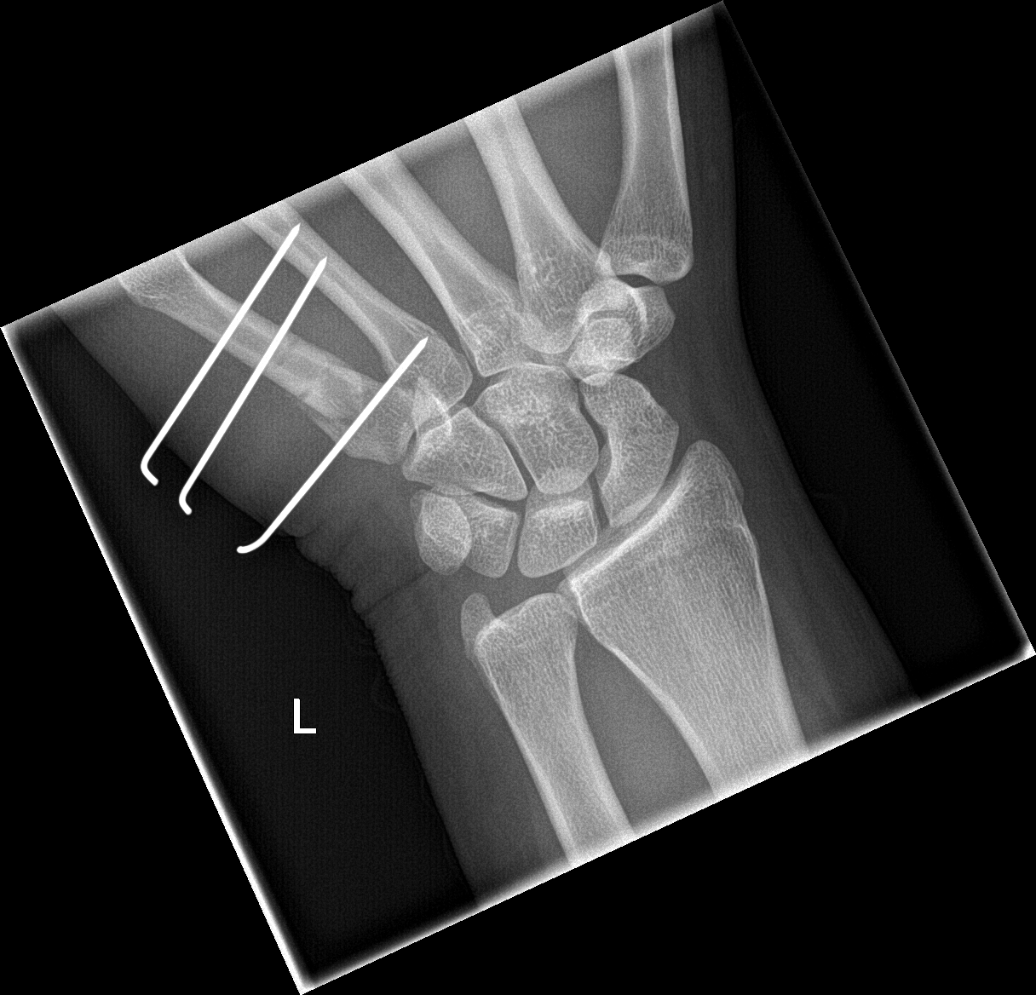

[4 of 4 positions shown; findings below may reference images not displayed]

FINDINGS: K-wire fixation of the fourth and fifth metacarpals. Early healing
of the fifth metacarpal fracture. No wrist injury is evident.
IMPRESSION: As above.

## 2015-12-26 ENCOUNTER — Encounter (HOSPITAL_COMMUNITY): Payer: Self-pay | Admitting: Nurse Practitioner

## 2015-12-26 ENCOUNTER — Emergency Department (HOSPITAL_COMMUNITY): Payer: Self-pay

## 2015-12-26 ENCOUNTER — Emergency Department (HOSPITAL_COMMUNITY)
Admission: EM | Admit: 2015-12-26 | Discharge: 2015-12-26 | Disposition: A | Discharge status: Home / Self Care | Payer: Self-pay | Attending: Emergency Medicine | Admitting: Emergency Medicine

## 2015-12-26 DIAGNOSIS — Z79899 Other long term (current) drug therapy: Secondary | ICD-10-CM | POA: Insufficient documentation

## 2015-12-26 DIAGNOSIS — S0990XA Unspecified injury of head, initial encounter: Secondary | ICD-10-CM | POA: Insufficient documentation

## 2015-12-26 DIAGNOSIS — Y929 Unspecified place or not applicable: Secondary | ICD-10-CM | POA: Insufficient documentation

## 2015-12-26 DIAGNOSIS — Z91040 Latex allergy status: Secondary | ICD-10-CM | POA: Insufficient documentation

## 2015-12-26 DIAGNOSIS — Y999 Unspecified external cause status: Secondary | ICD-10-CM | POA: Insufficient documentation

## 2015-12-26 DIAGNOSIS — Y939 Activity, unspecified: Secondary | ICD-10-CM | POA: Insufficient documentation

## 2015-12-26 DIAGNOSIS — Z87891 Personal history of nicotine dependence: Secondary | ICD-10-CM | POA: Insufficient documentation

## 2015-12-26 DIAGNOSIS — S0101XA Laceration without foreign body of scalp, initial encounter: Secondary | ICD-10-CM | POA: Insufficient documentation

## 2015-12-26 MED ORDER — IBUPROFEN 800 MG PO TABS
800.0000 mg | ORAL_TABLET | Freq: Once | ORAL | Status: AC
  Administered 2015-12-26: 800 mg via ORAL
  Filled 2015-12-26: qty 1
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
## 2015-12-26 NOTE — Discharge Instructions (Signed)
Take 800 mg ibuprofen every 8 hours as needed for headache. Have your staples removed by your primary care doctor in 1 week. You may return to the emergency department for staple removal if you are unable to see her primary doctor. Return for any new or concerning symptoms.

## 2015-12-26 NOTE — ED Notes (Signed)
Bed: WA03 Expected date:  Expected time:  Means of arrival:  Comments: 25 yo F  Head laceration
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
## 2016-10-24 ENCOUNTER — Encounter (HOSPITAL_COMMUNITY): Payer: Self-pay | Admitting: *Deleted

## 2016-10-24 ENCOUNTER — Inpatient Hospital Stay (HOSPITAL_COMMUNITY)
Admission: AD | Admit: 2016-10-24 | Discharge: 2016-10-24 | Disposition: A | Discharge status: Home / Self Care | Payer: Self-pay | Source: Ambulatory Visit | Attending: Obstetrics and Gynecology | Admitting: Obstetrics and Gynecology

## 2016-10-24 DIAGNOSIS — B9689 Other specified bacterial agents as the cause of diseases classified elsewhere: Secondary | ICD-10-CM | POA: Insufficient documentation

## 2016-10-24 DIAGNOSIS — N898 Other specified noninflammatory disorders of vagina: Secondary | ICD-10-CM

## 2016-10-24 DIAGNOSIS — N760 Acute vaginitis: Secondary | ICD-10-CM | POA: Insufficient documentation

## 2016-10-24 DIAGNOSIS — Z3202 Encounter for pregnancy test, result negative: Secondary | ICD-10-CM | POA: Insufficient documentation

## 2016-10-24 DIAGNOSIS — Z87891 Personal history of nicotine dependence: Secondary | ICD-10-CM | POA: Insufficient documentation

## 2016-10-24 LAB — URINALYSIS, ROUTINE W REFLEX MICROSCOPIC
BILIRUBIN URINE: NEGATIVE
GLUCOSE, UA: NEGATIVE mg/dL
HGB URINE DIPSTICK: NEGATIVE
Ketones, ur: NEGATIVE mg/dL
Leukocytes, UA: NEGATIVE
Nitrite: NEGATIVE
PH: 6 (ref 5.0–8.0)
Protein, ur: NEGATIVE mg/dL
SPECIFIC GRAVITY, URINE: 1.017 (ref 1.005–1.030)

## 2016-10-24 LAB — WET PREP, GENITAL
SPERM: NONE SEEN
Trich, Wet Prep: NONE SEEN
Yeast Wet Prep HPF POC: NONE SEEN

## 2016-10-24 LAB — POCT PREGNANCY, URINE: Preg Test, Ur: NEGATIVE

## 2016-10-24 MED ORDER — CLINDAMYCIN HCL 300 MG PO CAPS: 300.0000 mg | ORAL_CAPSULE | Freq: Three times a day (TID) | ORAL | 0 refills | Status: DC

## 2016-10-24 NOTE — Progress Notes (Signed)
Written and verbal d/c instructions given and understanding voiced. 

## 2016-10-24 NOTE — MAU Provider Note (Signed)
Chief Complaint:  Vaginal Pain   First Provider Initiated Contact with Patient 10/24/16 2148       HPI: Taylor Flowers is a 26 y.o. [id number] who presents to maternity admissions reporting burning and tingling of vagina for the past 2 weeks.  Got more noticeable today.  Worried about exposure to STDs. States has only 1 partner.  She reports no vaginal bleeding, urinary symptoms, h/a, dizziness, n/v, or fever/chills.    Vaginal Pain  The patient's primary symptoms include genital itching. The patient's pertinent negatives include no genital lesions, genital odor, genital rash, missed menses, pelvic pain or vaginal bleeding. This is a recurrent problem. The current episode started 1 to 4 weeks ago. The problem occurs intermittently. The patient is experiencing no pain. She is not pregnant. Pertinent negatives include no abdominal pain, back pain, chills, constipation, diarrhea, dysuria, fever, flank pain, nausea or vomiting. The symptoms are aggravated by tactile pressure. She has tried nothing for the symptoms. She is sexually active. It is possible that her partner has an STD.    RN note: My LMP was 10/11/16. Wk before that noticed burning, tingling in vaginal area after I wipe. No d/c or odor.   Past Medical History: Past Medical History:  Diagnosis Date  . Anemia   . Anxiety   . Depression   . History of MRSA infection 2012   buttock  . Iron deficiency anemia due to chronic blood loss 02/11/2015  . Menometrorrhagia 02/11/2015  . Metacarpal bone fracture 12/2013   left small  . Migraines    "a few times/yr" (01/07/2015)  . Miscarriage   . PTSD (post-traumatic stress disorder)   . Spinal headache   . Von Willebrand disease (HCC)   . Von Willebrand disease, type 1a (HCC) 02/11/2015    Past obstetric history: OB History  Gravida Para Term Preterm AB Living  0  SAB TAB Ectopic Multiple Live Births  3   0   1    # Outcome Date GA Lbr Len/2nd Weight Sex Delivery Anes PTL Lv   4 SAB 08/2014             Complications: Missed abortion  3 Preterm 08/23/13 [redacted]w[redacted]d 01:54 / 00:08 14.8 oz (0.42 kg) M Vag-Spont None Y ND     Complications: Cervical incompetence     Birth Comments: previable  2 SAB 01/24/13 [redacted]w[redacted]d  4.2 oz (0.119 kg) M  None       Birth Comments: incompetent cervix  1 SAB 06/25/07 [redacted]w[redacted]d            Birth Comments: surgery      Past Surgical History: Past Surgical History:  Procedure Laterality Date  . CERVICAL CERCLAGE N/A 06/13/2013   Procedure: CERCLAGE CERVICAL;  Surgeon: Kathreen Cosier, MD;  Location: [facility];  Service: Gynecology;  Laterality: N/A;  . CLOSED REDUCTION METACARPAL WITH PERCUTANEOUS PINNING Left 12/25/2013   Procedure: LEFT SMALL METACARPAL CLOSED REDUCTION PERCUTANEOUS PINNING ;  Surgeon: Betha Loa, MD;  Location: [facility];  Service: Orthopedics;  Laterality: Left;  . DILATION AND CURETTAGE OF UTERUS N/A 08/23/2013   Procedure: Dilatation and Currettage with Ultrasound Guidance;  Surgeon: Kathreen Cosier, MD;  Location: [facility];  Service: Gynecology;  Laterality: N/A;  . DILATION AND CURETTAGE OF UTERUS  06/2007  . DILATION AND EVACUATION N/A 01/24/2013   Procedure: DILATATION AND EVACUATION;  Surgeon: Catalina Antigua, MD;  Location: [facility];  Service: Gynecology;  Laterality: N/A;  . DILATION AND EVACUATION N/A 09/11/2014   Procedure: DILATATION AND EVACUATION;  Surgeon: Lavina Hamman, MD;  Location: [facility];  Service: Gynecology;  Laterality: N/A;  . FRACTURE SURGERY    . TONSILLECTOMY N/A 12/30/2014   Procedure: CONTROL OF POST TONSILLAR BLEED;  Surgeon: Osborn Coho, MD;  Location: [facility] OR;  Service: ENT;  Laterality: N/A;  . TONSILLECTOMY  01/07/2015   control of post tonsillar bleed  . TONSILLECTOMY N/A 01/07/2015   Procedure: CONTROL TONSIL BLEED;  Surgeon: Melvenia Beam, MD;  Location: [facility] OR;  Service: ENT;  Laterality: N/A;  . TONSILLECTOMY AND ADENOIDECTOMY N/A 12/20/2014   Procedure: CONTROL TONSIL  BLEED;   Surgeon: Flo Shanks, MD;  Location: [facility] OR;  Service: ENT;  Laterality: N/A;    Family History: Family History  Problem Relation Age of Onset  . Hypertension Mother   . Hypertension Father   . Diabetes Father   . Vision loss Father     Social History: Social History  Substance Use Topics  . Smoking status: Former Smoker    Years: 1.00    Types: Cigars  . Smokeless tobacco: Never Used  . Alcohol use 0.0 oz/week     Comment: occ     Allergies:  Allergies  Allergen Reactions  . Flagyl [Metronidazole] Shortness Of Breath  . Darvocet [Propoxyphene N-Acetaminophen] Other (See Comments)    Reaction:  Joint pain   . Latex Rash    Meds:  Prescriptions Prior to Admission  Medication Sig Dispense Refill Last Dose  . diphenhydrAMINE (BENADRYL) 25 MG tablet Take 50 mg by mouth at bedtime as needed for sleep.   08/16/2015 at Unknown time  . ibuprofen (ADVIL,MOTRIN) 800 MG tablet Take 800 mg by mouth every 8 (eight) hours as needed for mild pain or moderate pain.   Past Week at Unknown time  . sertraline (ZOLOFT) 100 MG tablet Take 100 mg by mouth daily as needed (for anxiety).    Past Month at Unknown time    I have reviewed patient's Past Medical Hx, Surgical Hx, Family Hx, Social Hx, medications and allergies.  ROS:  Review of Systems  Constitutional: Negative for chills and fever.  Gastrointestinal: Negative for abdominal pain, constipation, diarrhea, nausea and vomiting.  Genitourinary: Positive for vaginal pain. Negative for dysuria, flank pain, missed menses and pelvic pain.  Musculoskeletal: Negative for back pain.   Other systems negative     Physical Exam  Patient Vitals for the past 24 hrs:  BP Temp Pulse Resp Height Weight  10/24/16 1959 (!) 144/95 - - - - -  10/24/16 1957 - 98.6 F (37 C) 86 18 6' (1.829 m) 290 lb (131.5 kg)   Constitutional: Well-developed, well-nourished female in no acute distress.  Cardiovascular: normal rate and rhythm Respiratory:  normal effort, no distress.  GI: Abd soft, non-tender.  Nondistended.  No rebound, No guarding.  MS: Extremities nontender, no edema, normal ROM Neurologic: Alert and oriented x 4.   Grossly nonfocal. GU: Neg CVAT. Skin:  Warm and Dry Psych:  Affect appropriate.  PELVIC EXAM: Cervix pink, visually closed, without lesion, scant white creamy discharge, vaginal walls and external genitalia normal Bimanual exam: Cervix firm, anterior, neg CMT, uterus nontender, nonenlarged, adnexa without tenderness, enlargement, or mass    Labs: Results for orders placed or performed during the hospital encounter of 10/24/16 (from the past 24 hour(s))  Urinalysis, Routine w reflex microscopic     Status: None   Collection Time: 10/24/16  8:05 PM  Result Value Ref Range   Color, Urine YELLOW YELLOW   APPearance CLEAR CLEAR   Specific Gravity, Urine 1.017 1.005 - 1.030   pH 6.0 5.0 - 8.0   Glucose, UA NEGATIVE NEGATIVE mg/dL   Hgb urine dipstick NEGATIVE NEGATIVE   Bilirubin Urine NEGATIVE NEGATIVE   Ketones, ur NEGATIVE NEGATIVE mg/dL   Protein, ur NEGATIVE NEGATIVE mg/dL   Nitrite NEGATIVE NEGATIVE   Leukocytes, UA NEGATIVE NEGATIVE  Pregnancy, urine POC     Status: None   Collection Time: 10/24/16  8:51 PM  Result Value Ref Range   Preg Test, Ur NEGATIVE NEGATIVE      Imaging:  No results found.  MAU Course/MDM: I have ordered labs as follows: GC/Chlamydia, wet prep, HIV, Hbsag, RPR Imaging ordered: none Results reviewed. Discussed Bacterial vaginosis and treatment.  Allergic to Flagyl, so will prescribe clindamycin  Consult Dr Senaida Ores with presentation, exam findings and results.   Treatments in MAU included none.   Pt stable at time of discharge.  Assessment: Vaginal irritation - Plan: Discharge patient  Bacterial vaginosis - Plan: Discharge patient   Plan: Discharge home Recommend safe sex practices Rx sent for clindamycin for BV   Encouraged to return here or to other  Urgent Care/ED if she develops worsening of symptoms, increase in pain, fever, or other concerning symptoms.   Wynelle Bourgeois CNM, MSN Certified Nurse-Midwife 10/24/2016 9:50 PM

## 2016-10-24 NOTE — Discharge Instructions (Signed)
Bacterial Vaginosis Bacterial vaginosis is an infection of the vagina. It happens when too many germs (bacteria) grow in the vagina. This infection puts you at risk for infections from sex (STIs). Treating this infection can lower your risk for some STIs. You should also treat this if you are pregnant. It can cause your baby to be born early. Follow these instructions at home: Medicines  Take over-the-counter and prescription medicines only as told by your doctor.  Take or use your antibiotic medicine as told by your doctor. Do not stop taking or using it even if you start to feel better. General instructions  If you your sexual partner is a woman, tell her that you have this infection. She needs to get treatment if she has symptoms. If you have a female partner, he does not need to be treated.  During treatment: ? Avoid sex. ? Do not douche. ? Avoid alcohol as told. ? Avoid breastfeeding as told.  Drink enough fluid to keep your pee (urine) clear or pale yellow.  Keep your vagina and butt (rectum) clean. ? Wash the area with warm water every day. ? Wipe from front to back after you use the toilet.  Keep all follow-up visits as told by your doctor. This is important. Preventing this condition  Do not douche.  Use only warm water to wash around your vagina.  Use protection when you have sex. This includes: ? Latex condoms. ? Dental dams.  Limit how many people you have sex with. It is best to only have sex with the same person (be monogamous).  Get tested for STIs. Have your partner get tested.  Wear underwear that is cotton or lined with cotton.  Avoid tight pants and pantyhose. This is most important in summer.  Do not use any products that have nicotine or tobacco in them. These include cigarettes and e-cigarettes. If you need help quitting, ask your doctor.  Do not use illegal drugs.  Limit how much alcohol you drink. Contact a doctor if:  Your symptoms do not get  better, even after you are treated.  You have more discharge or pain when you pee (urinate).  You have a fever.  You have pain in your belly (abdomen).  You have pain with sex.  Your bleed from your vagina between periods. Summary  This infection happens when too many germs (bacteria) grow in the vagina.  Treating this condition can lower your risk for some infections from sex (STIs).  You should also treat this if you are pregnant. It can cause early (premature) birth.  Do not stop taking or using your antibiotic medicine even if you start to feel better. This information is not intended to replace advice given to you by your health care provider. Make sure you discuss any questions you have with your health care provider. Document Released: 10/15/2007 Document Revised: 09/21/2015 Document Reviewed: 09/21/2015 Elsevier Interactive Patient Education  2017 [employer].  Safe Sex Practicing safe sex means taking steps before and during sex to reduce your risk of:  Getting an STD (sexually transmitted disease).  Giving your partner an STD.  Unwanted pregnancy.  How can I practice safe sex?  To practice safe sex:  Limit your sexual partners to only one partner who is having sex with only you.  Avoid using alcohol and recreational drugs before having sex. These substances can affect your judgment.  Before having sex with a new partner: ? Talk to your partner about past partners, past  STDs, and drug use. ? You and your partner should be screened for STDs and discuss the results with each other.  Check your body regularly for sores, blisters, rashes, or unusual discharge. If you notice any of these problems, visit your health care provider.  If you have symptoms of an infection or you are being treated for an STD, avoid sexual contact.  While having sex, use a condom. Make sure to: ? Use a condom every time you have vaginal, oral, or anal sex. Both females and males  should wear condoms during oral sex. ? Keep condoms in place from the beginning to the end of sexual activity. ? Use a latex condom, if possible. Latex condoms offer the best protection. ? Use only water-based lubricants or oils to lubricate a condom. Using petroleum-based lubricants or oils will weaken the condom and increase the chance that it will break.  See your health care provider for regular screenings, exams, and tests for STDs.  Talk with your health care provider about the form of birth control (contraception) that is best for you.  Get vaccinated against hepatitis B and human papillomavirus (HPV).  If you are at risk of being infected with HIV (human immunodeficiency virus), talk with your health care provider about taking a prescription medicine to prevent HIV infection. You are considered at risk for HIV if: ? You are a man who has sex with other men. ? You are a heterosexual man or woman who is sexually active with more than one partner. ? You take drugs by injection. ? You are sexually active with a partner who has HIV.  This information is not intended to replace advice given to you by your health care provider. Make sure you discuss any questions you have with your health care provider. Document Released: 02/13/2004 Document Revised: 05/22/2015 Document Reviewed: 11/25/2014 Elsevier Interactive Patient Education  [employer]2018 Elsevier Inc.

## 2016-10-24 NOTE — MAU Note (Signed)
My LMP was 10/11/16. Wk before that noticed burning, tingling in vaginal area after I wipe. No d/c or odor.

## 2016-10-25 LAB — HIV ANTIBODY (ROUTINE TESTING W REFLEX): HIV Screen 4th Generation wRfx: NONREACTIVE

## 2016-10-25 LAB — HEPATITIS B SURFACE ANTIGEN: HEP B S AG: NEGATIVE

## 2016-10-25 LAB — RPR: RPR: NONREACTIVE

## 2016-10-26 LAB — GC/CHLAMYDIA PROBE AMP (~~LOC~~) NOT AT ARMC
Chlamydia: NEGATIVE
NEISSERIA GONORRHEA: NEGATIVE

## 2017-07-12 ENCOUNTER — Emergency Department (HOSPITAL_COMMUNITY)
Admission: EM | Admit: 2017-07-12 | Discharge: 2017-07-12 | Disposition: A | Discharge status: Home / Self Care | Payer: No Typology Code available for payment source | Attending: Emergency Medicine | Admitting: Emergency Medicine

## 2017-07-12 ENCOUNTER — Other Ambulatory Visit: Payer: Self-pay

## 2017-07-12 ENCOUNTER — Encounter (HOSPITAL_COMMUNITY): Payer: Self-pay

## 2017-07-12 ENCOUNTER — Emergency Department (HOSPITAL_COMMUNITY): Payer: No Typology Code available for payment source

## 2017-07-12 DIAGNOSIS — I10 Essential (primary) hypertension: Secondary | ICD-10-CM | POA: Insufficient documentation

## 2017-07-12 DIAGNOSIS — Z041 Encounter for examination and observation following transport accident: Secondary | ICD-10-CM | POA: Diagnosis not present

## 2017-07-12 DIAGNOSIS — M7918 Myalgia, other site: Secondary | ICD-10-CM | POA: Diagnosis present

## 2017-07-12 DIAGNOSIS — Y9389 Activity, other specified: Secondary | ICD-10-CM | POA: Diagnosis not present

## 2017-07-12 DIAGNOSIS — Y999 Unspecified external cause status: Secondary | ICD-10-CM | POA: Insufficient documentation

## 2017-07-12 DIAGNOSIS — Y92410 Unspecified street and highway as the place of occurrence of the external cause: Secondary | ICD-10-CM | POA: Insufficient documentation

## 2017-07-12 DIAGNOSIS — Z91040 Latex allergy status: Secondary | ICD-10-CM | POA: Diagnosis not present

## 2017-07-12 MED ORDER — TRAMADOL HCL 50 MG PO TABS: ORAL_TABLET | ORAL | 0 refills | Status: DC

## 2017-07-12 MED ORDER — LISINOPRIL 20 MG PO TABS: 20.0000 mg | ORAL_TABLET | Freq: Every day | ORAL | 2 refills | Status: DC
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
## 2017-07-12 NOTE — ED Notes (Signed)
Discharge instructions reviewed with patient. Patient verbalizes understanding. VSS.   

## 2017-07-25 ENCOUNTER — Encounter (HOSPITAL_COMMUNITY): Payer: Self-pay

## 2017-07-25 ENCOUNTER — Other Ambulatory Visit: Payer: Self-pay

## 2017-07-25 ENCOUNTER — Emergency Department (HOSPITAL_COMMUNITY)
Admission: EM | Admit: 2017-07-25 | Discharge: 2017-07-25 | Disposition: A | Discharge status: Home / Self Care | Payer: Medicaid Other | Attending: Emergency Medicine | Admitting: Emergency Medicine

## 2017-07-25 DIAGNOSIS — Z87891 Personal history of nicotine dependence: Secondary | ICD-10-CM | POA: Insufficient documentation

## 2017-07-25 DIAGNOSIS — Z79899 Other long term (current) drug therapy: Secondary | ICD-10-CM | POA: Insufficient documentation

## 2017-07-25 DIAGNOSIS — N938 Other specified abnormal uterine and vaginal bleeding: Secondary | ICD-10-CM | POA: Insufficient documentation

## 2017-07-25 DIAGNOSIS — D6800 Von Willebrand disease, unspecified: Secondary | ICD-10-CM

## 2017-07-25 LAB — BASIC METABOLIC PANEL
Anion gap: 6 (ref 5–15)
BUN: 22 mg/dL — ABNORMAL HIGH (ref 6–20)
CALCIUM: 8.7 mg/dL — AB (ref 8.9–10.3)
CHLORIDE: 108 mmol/L (ref 98–111)
CO2: 28 mmol/L (ref 22–32)
Creatinine, Ser: 0.94 mg/dL (ref 0.44–1.00)
GFR calc Af Amer: >60 mL/min (ref >60)
GFR calc non Af Amer: >60 mL/min (ref >60)
GLUCOSE: 99 mg/dL (ref 70–99)
POTASSIUM: 3.7 mmol/L (ref 3.5–5.1)
Sodium: 142 mmol/L (ref 135–145)

## 2017-07-25 LAB — CBC
HEMATOCRIT: 41.5 % (ref 36.0–46.0)
HEMOGLOBIN: 13.9 g/dL (ref 12.0–15.0)
MCH: 30.5 pg (ref 26.0–34.0)
MCHC: 33.5 g/dL (ref 30.0–36.0)
MCV: 91 fL (ref 78.0–100.0)
Platelets: 307 10*3/uL (ref 150–400)
RBC: 4.56 MIL/uL (ref 3.87–5.11)
RDW: 13 % (ref 11.5–15.5)
WBC: 9.1 10*3/uL (ref 4.0–10.5)

## 2017-07-25 LAB — I-STAT BETA HCG BLOOD, ED (MC, WL, AP ONLY): I-stat hCG, quantitative: <5 m[IU]/mL (ref <5)

## 2017-07-25 MED ORDER — DESMOPRESSIN ACETATE 1.5 MG/ML NA SOLN: 2.0000 | Freq: Every day | NASAL | 0 refills | Status: AC

## 2017-07-25 MED ORDER — SODIUM CHLORIDE 0.9 % IV SOLN
20.0000 ug | Freq: Once | INTRAVENOUS | Status: AC
  Administered 2017-07-25: 20 ug via INTRAVENOUS
  Filled 2017-07-25: qty 5

## 2017-07-25 MED ORDER — DESMOPRESSIN ACETATE 1.5 MG/ML NA SOLN: 2.0000 | Freq: Once | NASAL | Status: DC

## 2017-07-25 NOTE — ED Triage Notes (Addendum)
Hx of Vonn Willebrands. Pt states that she has been having menstrual bleeding since 6/29. Pt states that she has been changing her ultra tampons 2-3 times per hour. Pt states that her gums have been bleeding from her gums as well.  Pt states that she had a similar issue when she had her tonsils out.

## 2017-07-25 NOTE — ED Provider Notes (Signed)
[facility]-EMERGENCY DEPT Provider Note   CSN: [id number] Arrival date & time: 07/25/17  4098     History   Chief Complaint Chief Complaint  Patient presents with  . Coagulation Disorder    HPI Taylor Flowers is a 27 y.o. female.  HPI Patient is a 27 year old female with a history of von Willebrand's disease, type Ia, who presents the emergency department with complaints of bleeding of her gums with brushing and heavy vaginal bleeding over the past several days.  She has normal menstrual cycles that usually last 6 days.  Her last menstrual cycle began at a normal time but is been heavier than usual.  She denies abdominal pain.  No lightheadedness.  She has had significant vaginal bleeding before with miscarriages requiring D&C x2.  No back pain.  No other complaints.  Symptoms are mild to moderate in severity.  She has taken DDAVP before in the past with tonsillectomy and post tonsillectomy bleeding but is never had it other than that time.   Past Medical History:  Diagnosis Date  . Anemia   . Anxiety   . Depression   . History of MRSA infection 2012   buttock  . Iron deficiency anemia due to chronic blood loss 02/11/2015  . Menometrorrhagia 02/11/2015  . Metacarpal bone fracture 12/2013   left small  . Migraines    "a few times/yr" (01/07/2015)  . Miscarriage   . PTSD (post-traumatic stress disorder)   . Spinal headache   . Von Willebrand disease (HCC)   . Von Willebrand disease, type 1a (HCC) 02/11/2015    Patient Active Problem List   Diagnosis Date Noted  . Von Willebrand disease, type 1a (HCC) 02/11/2015  . Iron deficiency anemia due to chronic blood loss 02/11/2015  . Menometrorrhagia 02/11/2015  . Post-tonsillectomy hemorrhage 01/07/2015  . Secondary post tonsillectomy hemorrhage 12/30/2014    Class: Acute  . Missed abortion 09/11/2014  . Spontaneous abortion in second trimester 08/23/2013  . 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 Short  cervix with cervical cerclage in second trimester, antepartum 08/13/2013  . PROM (premature rupture of membranes) 01/24/2013  . Premature rupture of membranes in pregnancy, antepartum-at 17 wks 01/21/2013  . Cervical incompetence, antepartum 01/20/2013    Past Surgical History:  Procedure Laterality Date  . CERVICAL CERCLAGE N/A 06/13/2013   Procedure: CERCLAGE CERVICAL;  Surgeon: Kathreen Cosier, MD;  Location: [facility];  Service: Gynecology;  Laterality: N/A;  . CLOSED REDUCTION METACARPAL WITH PERCUTANEOUS PINNING Left 12/25/2013   Procedure: LEFT SMALL METACARPAL CLOSED REDUCTION PERCUTANEOUS PINNING ;  Surgeon: Betha Loa, MD;  Location: [facility];  Service: Orthopedics;  Laterality: Left;  . DILATION AND CURETTAGE OF UTERUS N/A 08/23/2013   Procedure: Dilatation and Currettage with Ultrasound Guidance;  Surgeon: Kathreen Cosier, MD;  Location: [facility];  Service: Gynecology;  Laterality: N/A;  . DILATION AND CURETTAGE OF UTERUS  06/2007  . DILATION AND EVACUATION N/A 01/24/2013   Procedure: DILATATION AND EVACUATION;  Surgeon: Catalina Antigua, MD;  Location: [facility];  Service: Gynecology;  Laterality: N/A;  . DILATION AND EVACUATION N/A 09/11/2014   Procedure: DILATATION AND EVACUATION;  Surgeon: Lavina Hamman, MD;  Location: [facility];  Service: Gynecology;  Laterality: N/A;  . FRACTURE SURGERY    . TONSILLECTOMY N/A 12/30/2014   Procedure: CONTROL OF POST TONSILLAR BLEED;  Surgeon: Osborn Coho, MD;  [facility];  Service: ENT;  Laterality: N/A;  . TONSILLECTOMY  01/07/2015   control  of post tonsillar bleed  . TONSILLECTOMY N/A 01/07/2015   Procedure: CONTROL TONSIL BLEED;  Surgeon: Melvenia BeamMitchell Gore, MD;  [facility];  Service: ENT;  Laterality: N/A;  . TONSILLECTOMY AND ADENOIDECTOMY N/A 12/20/2014   Procedure: CONTROL TONSIL  BLEED;  Surgeon: Flo ShanksKarol Wolicki, MD;  Location: [facility] OR;  Service: ENT;  Laterality: N/A;     OB History    Gravida  4   Para  1   Term        Preterm  1   AB  3   Living  0     SAB  3   TAB      Ectopic  0   Multiple      Live Births  1            Home Medications    Prior to Admission medications   Medication Sig Start Date End Date Taking? Authorizing Provider  ibuprofen (ADVIL,MOTRIN) 200 MG tablet Take 800 mg by mouth every 6 (six) hours as needed (For pain.).   Yes [provider]  traMADol (ULTRAM) 50 MG tablet Take 1 tramadol every 6 hours for pain not helped by Tylenol alone 07/12/17  Yes Bethann BerkshireZammit, Joseph, MD  lisinopril (PRINIVIL,ZESTRIL) 20 MG tablet Take 1 tablet (20 mg total) by mouth daily. 07/12/17   Bethann BerkshireZammit, Joseph, MD    Family History Family History  Problem Relation Age of Onset  . Hypertension Mother   . Hypertension Father   . Diabetes Father   . Vision loss Father     Social History Social History   Tobacco Use  . Smoking status: Former Smoker    Years: 1.00    Types: Cigars  . Smokeless tobacco: Never Used  Substance Use Topics  . Alcohol use: Yes    Alcohol/week: 0.0 oz    Comment: occ   . Drug use: Yes    Frequency: 7.0 times per week    Types: Marijuana    Comment: last night     Allergies   Flagyl [metronidazole]; Darvocet [propoxyphene n-acetaminophen]; and Latex   Review of Systems Review of Systems  All other systems reviewed and are negative.    Physical Exam Updated Vital Signs BP 125/90 (BP Location: Left Arm)   Pulse 84   Temp 98.1 F (36.7 C) (Oral)   Resp 17   Ht 6' (1.829 m)   Wt 131.5 kg (290 lb)   LMP 07/17/2017   SpO2 99%   BMI 39.33 kg/m   Physical Exam  Constitutional: She is oriented to person, place, and time. She appears well-developed and well-nourished.  HENT:  Head: Normocephalic.  Eyes: EOM are normal.  Neck: Normal range of motion.  Cardiovascular: Normal rate.  Pulmonary/Chest: Effort normal.  Abdominal: Soft. She exhibits no distension. There is no tenderness.  Musculoskeletal: Normal range of motion.   Neurological: She is alert and oriented to person, place, and time.  Psychiatric: She has a normal mood and affect.  Nursing note and vitals reviewed.    ED Treatments / Results  Labs (all labs ordered are listed, but only abnormal results are displayed) Labs Reviewed  CBC  BASIC METABOLIC PANEL  I-STAT BETA HCG BLOOD, ED ([facility], [facility], [facility] ONLY)    EKG None  Radiology No results found.  Procedures Procedures (including critical care time)  Medications Ordered in ED Medications  desmopressin (STIMATE) nasal spray 0.3 mg (has no administration in time range)     Initial Impression / Assessment and  Plan / ED Course  I have reviewed the triage vital signs and the nursing notes.  Pertinent labs & imaging results that were available during my care of the patient were reviewed by me and considered in my medical decision making (see chart for details).     We will check labs and pregnancy test.  Overall well-appearing.  Vital signs are stable.  DDAVP now.  She will need hematology follow-up and the likely plan will be discharged home with DDAVP once a day x1 week.  She will also need follow-up with gynecology in reference to her dysfunctional uterine bleeding.  Final Clinical Impressions(s) / ED Diagnoses   Final diagnoses:  DUB (dysfunctional uterine bleeding)  Von Willebrand disease [facility])    ED Discharge Orders        Ordered    desmopressin (STIMATE) 1.5 MG/ML SOLN  Daily     07/25/17 1016       Azalia Bilis, MD 07/25/17 1017

## 2017-07-26 ENCOUNTER — Other Ambulatory Visit: Payer: Self-pay | Admitting: Oncology

## 2017-07-27 ENCOUNTER — Other Ambulatory Visit: Payer: Self-pay | Admitting: Oncology

## 2017-08-20 ENCOUNTER — Other Ambulatory Visit: Payer: Self-pay | Admitting: Orthopaedic Surgery

## 2017-08-20 ENCOUNTER — Ambulatory Visit
Admission: RE | Admit: 2017-08-20 | Discharge: 2017-08-20 | Disposition: A | Discharge status: Home / Self Care | Payer: Medicaid Other | Source: Ambulatory Visit | Attending: Orthopaedic Surgery | Admitting: Orthopaedic Surgery

## 2017-08-20 DIAGNOSIS — M5412 Radiculopathy, cervical region: Secondary | ICD-10-CM

## 2017-08-27 ENCOUNTER — Emergency Department (HOSPITAL_COMMUNITY)
Admission: EM | Admit: 2017-08-27 | Discharge: 2017-08-27 | Disposition: A | Discharge status: Home / Self Care | Payer: No Typology Code available for payment source | Attending: Emergency Medicine | Admitting: Emergency Medicine

## 2017-08-27 ENCOUNTER — Encounter (HOSPITAL_COMMUNITY): Payer: Self-pay | Admitting: Emergency Medicine

## 2017-08-27 ENCOUNTER — Other Ambulatory Visit: Payer: Self-pay

## 2017-08-27 DIAGNOSIS — M546 Pain in thoracic spine: Secondary | ICD-10-CM | POA: Diagnosis not present

## 2017-08-27 DIAGNOSIS — F329 Major depressive disorder, single episode, unspecified: Secondary | ICD-10-CM | POA: Diagnosis not present

## 2017-08-27 DIAGNOSIS — M549 Dorsalgia, unspecified: Secondary | ICD-10-CM | POA: Diagnosis present

## 2017-08-27 DIAGNOSIS — Z87891 Personal history of nicotine dependence: Secondary | ICD-10-CM | POA: Diagnosis not present

## 2017-08-27 DIAGNOSIS — F419 Anxiety disorder, unspecified: Secondary | ICD-10-CM | POA: Insufficient documentation

## 2017-08-27 DIAGNOSIS — Z91040 Latex allergy status: Secondary | ICD-10-CM | POA: Insufficient documentation

## 2017-08-27 DIAGNOSIS — Z79899 Other long term (current) drug therapy: Secondary | ICD-10-CM | POA: Diagnosis not present

## 2017-08-27 DIAGNOSIS — M542 Cervicalgia: Secondary | ICD-10-CM | POA: Diagnosis not present
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
## 2017-08-27 NOTE — Discharge Instructions (Signed)
Please start Gabapentin and Meloxicam Take Tylenol as needed for headache  Please follow up with Dr. Madilyn FiremanHayes
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
## 2019-02-18 ENCOUNTER — Other Ambulatory Visit: Payer: Self-pay

## 2019-02-18 ENCOUNTER — Encounter (HOSPITAL_COMMUNITY): Payer: Self-pay | Admitting: Obstetrics & Gynecology

## 2019-02-18 ENCOUNTER — Inpatient Hospital Stay (HOSPITAL_COMMUNITY): Payer: Self-pay

## 2019-02-18 ENCOUNTER — Inpatient Hospital Stay (HOSPITAL_COMMUNITY)
Admission: AD | Admit: 2019-02-18 | Discharge: 2019-02-18 | Disposition: A | Discharge status: Home / Self Care | Payer: Self-pay | Attending: Obstetrics & Gynecology | Admitting: Obstetrics & Gynecology

## 2019-02-18 DIAGNOSIS — Z6790 Unspecified blood type, Rh positive: Secondary | ICD-10-CM

## 2019-02-18 DIAGNOSIS — Z3A08 8 weeks gestation of pregnancy: Secondary | ICD-10-CM | POA: Insufficient documentation

## 2019-02-18 DIAGNOSIS — Z8614 Personal history of Methicillin resistant Staphylococcus aureus infection: Secondary | ICD-10-CM | POA: Insufficient documentation

## 2019-02-18 DIAGNOSIS — R109 Unspecified abdominal pain: Secondary | ICD-10-CM | POA: Insufficient documentation

## 2019-02-18 DIAGNOSIS — R102 Pelvic and perineal pain: Secondary | ICD-10-CM

## 2019-02-18 DIAGNOSIS — O468X1 Other antepartum hemorrhage, first trimester: Secondary | ICD-10-CM

## 2019-02-18 DIAGNOSIS — O26899 Other specified pregnancy related conditions, unspecified trimester: Secondary | ICD-10-CM

## 2019-02-18 DIAGNOSIS — Z87891 Personal history of nicotine dependence: Secondary | ICD-10-CM | POA: Insufficient documentation

## 2019-02-18 DIAGNOSIS — O418X10 Other specified disorders of amniotic fluid and membranes, first trimester, not applicable or unspecified: Secondary | ICD-10-CM

## 2019-02-18 DIAGNOSIS — O208 Other hemorrhage in early pregnancy: Secondary | ICD-10-CM | POA: Insufficient documentation

## 2019-02-18 DIAGNOSIS — Z3A01 Less than 8 weeks gestation of pregnancy: Secondary | ICD-10-CM

## 2019-02-18 LAB — POCT PREGNANCY, URINE: Preg Test, Ur: POSITIVE — AB

## 2019-02-18 LAB — URINALYSIS, ROUTINE W REFLEX MICROSCOPIC
Bilirubin Urine: NEGATIVE
Glucose, UA: NEGATIVE mg/dL
Hgb urine dipstick: NEGATIVE
Ketones, ur: NEGATIVE mg/dL
Leukocytes,Ua: NEGATIVE
Nitrite: NEGATIVE
Protein, ur: NEGATIVE mg/dL
Specific Gravity, Urine: <1.005 — ABNORMAL LOW (ref 1.005–1.030)
pH: 7 (ref 5.0–8.0)

## 2019-02-18 LAB — CBC
HCT: 43.2 % (ref 36.0–46.0)
Hemoglobin: 14.9 g/dL (ref 12.0–15.0)
MCH: 30.6 pg (ref 26.0–34.0)
MCHC: 34.5 g/dL (ref 30.0–36.0)
MCV: 88.7 fL (ref 80.0–100.0)
Platelets: 292 10*3/uL (ref 150–400)
RBC: 4.87 MIL/uL (ref 3.87–5.11)
RDW: 12.6 % (ref 11.5–15.5)
WBC: 11.5 10*3/uL — ABNORMAL HIGH (ref 4.0–10.5)
nRBC: 0 % (ref 0.0–0.2)

## 2019-02-18 LAB — COMPREHENSIVE METABOLIC PANEL
ALT: 20 U/L (ref 0–44)
AST: 17 U/L (ref 15–41)
Albumin: 3.5 g/dL (ref 3.5–5.0)
Alkaline Phosphatase: 53 U/L (ref 38–126)
Anion gap: 9 (ref 5–15)
BUN: 7 mg/dL (ref 6–20)
CO2: 25 mmol/L (ref 22–32)
Calcium: 9.4 mg/dL (ref 8.9–10.3)
Chloride: 102 mmol/L (ref 98–111)
Creatinine, Ser: 0.84 mg/dL (ref 0.44–1.00)
GFR calc Af Amer: >60 mL/min (ref >60)
GFR calc non Af Amer: >60 mL/min (ref >60)
Glucose, Bld: 97 mg/dL (ref 70–99)
Potassium: 4.4 mmol/L (ref 3.5–5.1)
Sodium: 136 mmol/L (ref 135–145)
Total Bilirubin: 0.8 mg/dL (ref 0.3–1.2)
Total Protein: 6.9 g/dL (ref 6.5–8.1)

## 2019-02-18 LAB — HCG, QUANTITATIVE, PREGNANCY: hCG, Beta Chain, Quant, S: 83537 m[IU]/mL — ABNORMAL HIGH (ref <5)

## 2019-02-18 LAB — WET PREP, GENITAL
Sperm: NONE SEEN
Trich, Wet Prep: NONE SEEN
Yeast Wet Prep HPF POC: NONE SEEN

## 2019-02-18 NOTE — MAU Note (Signed)
Taylor Flowers is a 29 y.o. at [redacted]w[redacted]d here in MAU reporting: is having abdominal pressure since this AM. States it is only painful when she is sitting or laying for a long time. No bleeding. Has Von Willebrand disease and states she will need an abdominal cerclage.   LMP: 12/23/18  Onset of complaint: today  Pain score: 6/10  Vitals:   02/18/19 1452  BP: (!) 142/74  Pulse: 85  Resp: 16  Temp: 99.4 F (37.4 C)  SpO2: 99%      Lab orders placed from triage: UA, UPT

## 2019-02-18 NOTE — Discharge Instructions (Signed)
Abdominal Pain During Pregnancy  Abdominal pain is common during pregnancy, and has many possible causes. Some causes are more serious than others, and sometimes the cause is not known. Abdominal pain can be a sign that labor is starting. It can also be caused by normal growth and stretching of muscles and ligaments during pregnancy. Always tell your health care provider if you have any abdominal pain. Follow these instructions at home:  Do not have sex or put anything in your vagina until your pain goes away completely.  Get plenty of rest until your pain improves.  Drink enough fluid to keep your urine pale yellow.  Take over-the-counter and prescription medicines only as told by your health care provider.  Keep all follow-up visits as told by your health care provider. This is important. Contact a health care provider if:  Your pain continues or gets worse after resting.  You have lower abdominal pain that: ? Comes and goes at regular intervals. ? Spreads to your back. ? Is similar to menstrual cramps.  You have pain or burning when you urinate. Get help right away if:  You have a fever or chills.  You have vaginal bleeding.  You are leaking fluid from your vagina.  You are passing tissue from your vagina.  You have vomiting or diarrhea that lasts for more than 24 hours.  Your baby is moving less than usual.  You feel very weak or faint.  You have shortness of breath.  You develop severe pain in your upper abdomen. Summary  Abdominal pain is common during pregnancy, and has many possible causes.  If you experience abdominal pain during pregnancy, tell your health care provider right away.  Follow your health care provider's home care instructions and keep all follow-up visits as directed. This information is not intended to replace advice given to you by your health care provider. Make sure you discuss any questions you have with your health care  provider. Document Revised: 04/25/2018 Document Reviewed: 04/09/2016 Elsevier Patient Education  2020 [employer].  First Trimester of Pregnancy The first trimester of pregnancy is from week 1 until the end of week 13 (months 1 through 3). A week after a sperm fertilizes an egg, the egg will implant on the wall of the uterus. This embryo will begin to develop into a baby. Genes from you and your partner will form the baby. The female genes will determine whether the baby will be a boy or a girl. At 6-8 weeks, the eyes and face will be formed, and the heartbeat can be seen on ultrasound. At the end of 12 weeks, all the baby's organs will be formed. Now that you are pregnant, you will want to do everything you can to have a healthy baby. Two of the most important things are to get good prenatal care and to follow your health care provider's instructions. Prenatal care is all the medical care you receive before the baby's birth. This care will help prevent, find, and treat any problems during the pregnancy and childbirth. Body changes during your first trimester Your body goes through many changes during pregnancy. The changes vary from woman to woman.  You may gain or lose a couple of pounds at first.  You may feel sick to your stomach (nauseous) and you may throw up (vomit). If the vomiting is uncontrollable, call your health care provider.  You may tire easily.  You may develop headaches that can be relieved by medicines. All medicines should  be approved by your health care provider.  You may urinate more often. Painful urination may mean you have a bladder infection.  You may develop heartburn as a result of your pregnancy.  You may develop constipation because certain hormones are causing the muscles that push stool through your intestines to slow down.  You may develop hemorrhoids or swollen veins (varicose veins).  Your breasts may begin to grow larger and become tender. Your nipples  may stick out more, and the tissue that surrounds them (areola) may become darker.  Your gums may bleed and may be sensitive to brushing and flossing.  Dark spots or blotches (chloasma, mask of pregnancy) may develop on your face. This will likely fade after the baby is born.  Your menstrual periods will stop.  You may have a loss of appetite.  You may develop cravings for certain kinds of food.  You may have changes in your emotions from day to day, such as being excited to be pregnant or being concerned that something may go wrong with the pregnancy and baby.  You may have more vivid and strange dreams.  You may have changes in your hair. These can include thickening of your hair, rapid growth, and changes in texture. Some women also have hair loss during or after pregnancy, or hair that feels dry or thin. Your hair will most likely return to normal after your baby is born. What to expect at prenatal visits During a routine prenatal visit:  You will be weighed to make sure you and the baby are growing normally.  Your blood pressure will be taken.  Your abdomen will be measured to track your baby's growth.  The fetal heartbeat will be listened to between weeks 10 and 14 of your pregnancy.  Test results from any previous visits will be discussed. Your health care provider may ask you:  How you are feeling.  If you are feeling the baby move.  If you have had any abnormal symptoms, such as leaking fluid, bleeding, severe headaches, or abdominal cramping.  If you are using any tobacco products, including cigarettes, chewing tobacco, and electronic cigarettes.  If you have any questions. Other tests that may be performed during your first trimester include:  Blood tests to find your blood type and to check for the presence of any previous infections. The tests will also be used to check for low iron levels (anemia) and protein on red blood cells (Rh antibodies). Depending on  your risk factors, or if you previously had diabetes during pregnancy, you may have tests to check for high blood sugar that affects pregnant women (gestational diabetes).  Urine tests to check for infections, diabetes, or protein in the urine.  An ultrasound to confirm the proper growth and development of the baby.  Fetal screens for spinal cord problems (spina bifida) and Down syndrome.  HIV (human immunodeficiency virus) testing. Routine prenatal testing includes screening for HIV, unless you choose not to have this test.  You may need other tests to make sure you and the baby are doing well. Follow these instructions at home: Medicines  Follow your health care provider's instructions regarding medicine use. Specific medicines may be either safe or unsafe to take during pregnancy.  Take a prenatal vitamin that contains at least 600 micrograms (mcg) of folic acid.  If you develop constipation, try taking a stool softener if your health care provider approves. Eating and drinking   Eat a balanced diet that includes fresh fruits  and vegetables, whole grains, good sources of protein such as meat, eggs, or tofu, and low-fat dairy. Your health care provider will help you determine the amount of weight gain that is right for you.  Avoid raw meat and uncooked cheese. These carry germs that can cause birth defects in the baby.  Eating four or five small meals rather than three large meals a day may help relieve nausea and vomiting. If you start to feel nauseous, eating a few soda crackers can be helpful. Drinking liquids between meals, instead of during meals, also seems to help ease nausea and vomiting.  Limit foods that are high in fat and processed sugars, such as fried and sweet foods.  To prevent constipation: ? Eat foods that are high in fiber, such as fresh fruits and vegetables, whole grains, and beans. ? Drink enough fluid to keep your urine clear or pale  yellow. Activity  Exercise only as directed by your health care provider. Most women can continue their usual exercise routine during pregnancy. Try to exercise for 30 minutes at least 5 days a week. Exercising will help you: ? Control your weight. ? Stay in shape. ? Be prepared for labor and delivery.  Experiencing pain or cramping in the lower abdomen or lower back is a good sign that you should stop exercising. Check with your health care provider before continuing with normal exercises.  Try to avoid standing for long periods of time. Move your legs often if you must stand in one place for a long time.  Avoid heavy lifting.  Wear low-heeled shoes and practice good posture.  You may continue to have sex unless your health care provider tells you not to. Relieving pain and discomfort  Wear a good support bra to relieve breast tenderness.  Take warm sitz baths to soothe any pain or discomfort caused by hemorrhoids. Use hemorrhoid cream if your health care provider approves.  Rest with your legs elevated if you have leg cramps or low back pain.  If you develop varicose veins in your legs, wear support hose. Elevate your feet for 15 minutes, 3-4 times a day. Limit salt in your diet. Prenatal care  Schedule your prenatal visits by the twelfth week of pregnancy. They are usually scheduled monthly at first, then more often in the last 2 months before delivery.  Write down your questions. Take them to your prenatal visits.  Keep all your prenatal visits as told by your health care provider. This is important. Safety  Wear your seat belt at all times when driving.  Make a list of emergency phone numbers, including numbers for family, friends, the hospital, and police and fire departments. General instructions  Ask your health care provider for a referral to a local prenatal education class. Begin classes no later than the beginning of month 6 of your pregnancy.  Ask for help if  you have counseling or nutritional needs during pregnancy. Your health care provider can offer advice or refer you to specialists for help with various needs.  Do not use hot tubs, steam rooms, or saunas.  Do not douche or use tampons or scented sanitary pads.  Do not cross your legs for long periods of time.  Avoid cat litter boxes and soil used by cats. These carry germs that can cause birth defects in the baby and possibly loss of the fetus by miscarriage or stillbirth.  Avoid all smoking, herbs, alcohol, and medicines not prescribed by your health care provider. Chemicals in  these products affect the formation and growth of the baby.  Do not use any products that contain nicotine or tobacco, such as cigarettes and e-cigarettes. If you need help quitting, ask your health care provider. You may receive counseling support and other resources to help you quit.  Schedule a dentist appointment. At home, brush your teeth with a soft toothbrush and be gentle when you floss. Contact a health care provider if:  You have dizziness.  You have mild pelvic cramps, pelvic pressure, or nagging pain in the abdominal area.  You have persistent nausea, vomiting, or diarrhea.  You have a bad smelling vaginal discharge.  You have pain when you urinate.  You notice increased swelling in your face, hands, legs, or ankles.  You are exposed to fifth disease or chickenpox.  You are exposed to [place] measles (rubella) and have never had it. Get help right away if:  You have a fever.  You are leaking fluid from your vagina.  You have spotting or bleeding from your vagina.  You have severe abdominal cramping or pain.  You have rapid weight gain or loss.  You vomit blood or material that looks like coffee grounds.  You develop a severe headache.  You have shortness of breath.  You have any kind of trauma, such as from a fall or a car accident. Summary  The first trimester of pregnancy is  from week 1 until the end of week 13 (months 1 through 3).  Your body goes through many changes during pregnancy. The changes vary from woman to woman.  You will have routine prenatal visits. During those visits, your health care provider will examine you, discuss any test results you may have, and talk with you about how you are feeling. This information is not intended to replace advice given to you by your health care provider. Make sure you discuss any questions you have with your health care provider. Document Revised: 12/18/2016 Document Reviewed: 12/18/2015 Elsevier Patient Education  [place].  Cervical Cerclage  Cervical cerclage is a surgical procedure to correct a cervix that opens up and thins out before pregnancy is at term. This is also called cervical insufficiency, or incompetent cervix. This condition can cause labor to start early (prematurely). In this procedure, a health care provider uses stitches (sutures) to sew the cervix shut during pregnancy. Your health care provider may use ultrasound to help guide the procedure and monitor your baby. Ultrasound uses sound waves to take images of your cervix and uterus. The health care provider will assess these images on a monitor in the operating room. Tell a health care provider about:  Any allergies you have, especially any allergies related to prescribed medicine, stitches, or anesthetic medicines.  Any problems you or family members have had with anesthetic medicines.  Any blood disorders you have.  Any surgeries you have had, including prior cervical stitching.  Any medical conditions you have or have had. What are the risks? Generally, this is a safe procedure. However, problems may occur, including:  Infection, such as infection of the cervix or the bag of fluid that surrounds the baby (amniotic sac).  Vaginal bleeding.  Allergic reactions to medicines.  Damage to nearby structures or organs, such as  injury to the cervix or tearing of the amniotic sac.  Contractions that come too early, including going into early labor and delivery.  Cervical dystocia. This occurs when the cervix is unable to open normally during labor. What happens before  the procedure? Staying hydrated Follow instructions from your health care provider about hydration, which may include:  Up to 2 hours before the procedure - you may continue to drink clear liquids, such as water, clear fruit juice, black coffee, and plain tea.  Eating and drinking restrictions Follow instructions from your health care provider about eating and drinking, which may include:  8 hours before the procedure - stop eating heavy meals or foods, such as meat, fried foods, or fatty foods.  6 hours before the procedure - stop eating light meals or foods, such as toast or cereal.  6 hours before the procedure - stop drinking milk or drinks that contain milk.  2 hours before the procedure - stop drinking clear liquids. Medicines Ask your health care provider about:  Changing or stopping your regular medicines. This is especially important if you are taking diabetes medicines or blood thinners.  Taking medicines such as aspirin and ibuprofen. These medicines can thin your blood. Do not take these medicines unless your health care provider tells you to take them.  Taking over-the-counter medicines, vitamins, herbs, and supplements. Surgery safety Ask your health care provider:  How your surgery site will be marked.  What steps will be taken to help prevent infection. These may include: ? Removing hair at the surgery site. ? Washing skin with a germ-killing soap. ? Taking antibiotic medicine. General instructions  Do not put on any lotion, deodorant, or perfume.  Remove contact lenses and jewelry.  You may have an exam or testing, including blood or urine tests.  Plan to have someone take you home from the hospital or  clinic.  If you will be going home right after the procedure, plan to have someone with you for 24 hours. What happens during the procedure?  An IV will be inserted into one of your veins.  You may be given one or more of the following: ? A medicine to help you relax (sedative). ? A medicine to numb the area (local anesthetic). ? A medicine to make you fall asleep (general anesthetic). ? A medicine that is injected into your spine to numb the area below and slightly above the injection site (spinal anesthetic).  A lubricated instrument (speculum) will be inserted into your vagina. The speculum will be widened to open the walls of your vagina so your surgeon can see your cervix.  Your cervix will be grasped and tightly sutured to close it. To do this, your surgeon will stitch a strong band of thread around your cervix, then the thread will be tightened to hold your cervix shut. The procedure may vary among health care providers and hospitals. What happens after the procedure?  Your blood pressure, heart rate, breathing rate, and blood oxygen level will be monitored until you leave the hospital or clinic.  You will be monitored for premature contractions.  You may have light bleeding and mild cramping.  You may have to wear compression stockings. These stockings help to prevent blood clots and reduce swelling in your legs.  If you were given a sedative during the procedure, it can affect you for several hours. Do not drive or operate machinery until your health care provider says that it is safe.  You may be put on bed rest.  You may be given an injection of a hormone (progesterone) to prevent premature contractions. Summary  Cervical cerclage is a surgical procedure in which stitches are used to sew the cervix shut during pregnancy.  Before the  procedure, tell your health care provider about your medicines, or medical problems or blood disorders that you have.  This is a safe  procedure. However, problems may occur, including infection, bleeding, or premature labor.  Follow all instructions about eating and drinking before the procedure. Plan to have someone drive you home from the hospital or clinic. This information is not intended to replace advice given to you by your health care provider. Make sure you discuss any questions you have with your health care provider. Document Revised: 11/01/2018 Document Reviewed: 08/31/2018 Elsevier Patient Education  2020 [employer]  Subchorionic Hematoma  A subchorionic hematoma is a gathering of blood between the outer wall of the embryo (chorion) and the inner wall of the womb (uterus). This condition can cause vaginal bleeding. If they cause little or no vaginal bleeding, early small hematomas usually shrink on their own and do not affect your baby or pregnancy. When bleeding starts later in pregnancy, or if the hematoma is larger or occurs in older pregnant women, the condition may be more serious. Larger hematomas may get bigger, which increases the chances of miscarriage. This condition also increases the risk of:  Premature separation of the placenta from the uterus.  Premature (preterm) labor.  Stillbirth. What are the causes? The exact cause of this condition is not known. It occurs when blood is trapped between the placenta and the uterine wall because the placenta has separated from the original site of implantation. What increases the risk? You are more likely to develop this condition if:  You were treated with fertility medicines.  You conceived through in vitro fertilization (IVF). What are the signs or symptoms? Symptoms of this condition include:  Vaginal spotting or bleeding.  Contractions of the uterus. These cause abdominal pain. Sometimes you may have no symptoms and the bleeding may only be seen when ultrasound images are taken (transvaginal ultrasound). How is this diagnosed? This condition  is diagnosed based on a physical exam. This includes a pelvic exam. You may also have other tests, including:  Blood tests.  Urine tests.  Ultrasound of the abdomen. How is this treated? Treatment for this condition can vary. Treatment may include:  Watchful waiting. You will be monitored closely for any changes in bleeding. During this stage: ? The hematoma may be reabsorbed by the body. ? The hematoma may separate the fluid-filled space containing the embryo (gestational sac) from the wall of the womb (endometrium).  Medicines.  Activity restriction. This may be needed until the bleeding stops. Follow these instructions at home:  Stay on bed rest if told to do so by your health care provider.  Do not lift anything that is heavier than 10 lbs. (4.5 kg) or as told by your health care provider.  Do not use any products that contain nicotine or tobacco, such as cigarettes and e-cigarettes. If you need help quitting, ask your health care provider.  Track and write down the number of pads you use each day and how soaked (saturated) they are.  Do not use tampons.  Keep all follow-up visits as told by your health care provider. This is important. Your health care provider may ask you to have follow-up blood tests or ultrasound tests or both. Contact a health care provider if:  You have any vaginal bleeding.  You have a fever. Get help right away if:  You have severe cramps in your stomach, back, abdomen, or pelvis.  You pass large clots or tissue. Save any tissue  for your health care provider to look at.  You have more vaginal bleeding, and you faint or become lightheaded or weak. Summary  A subchorionic hematoma is a gathering of blood between the outer wall of the placenta and the uterus.  This condition can cause vaginal bleeding.  Sometimes you may have no symptoms and the bleeding may only be seen when ultrasound images are taken.  Treatment may include watchful  waiting, medicines, or activity restriction. This information is not intended to replace advice given to you by your health care provider. Make sure you discuss any questions you have with your health care provider. Document Revised: 12/18/2016 Document Reviewed: 03/03/2016 Elsevier Patient Education  2020 [employer]                   Safe Medications in Pregnancy    Acne: Benzoyl Peroxide Salicylic Acid  Backache/Headache: Tylenol: 2 regular strength every 4 hours OR              2 Extra strength every 6 hours  Colds/Coughs/Allergies: Benadryl (alcohol free) 25 mg every 6 hours as needed Breath right strips Claritin Cepacol throat lozenges Chloraseptic throat spray Cold-Eeze- up to three times per day Cough drops, alcohol free Flonase (by prescription only) Guaifenesin Mucinex Robitussin DM (plain only, alcohol free) Saline nasal spray/drops Sudafed (pseudoephedrine) & Actifed ** use only after [redacted] weeks gestation and if you do not have high blood pressure Tylenol Vicks Vaporub Zinc lozenges Zyrtec   Constipation: Colace Ducolax suppositories Fleet enema Glycerin suppositories Metamucil Milk of magnesia Miralax Senokot Smooth move tea  Diarrhea: Kaopectate Imodium A-D  *NO pepto Bismol  Hemorrhoids: Anusol Anusol HC Preparation H Tucks  Indigestion: Tums Maalox Mylanta Zantac  Pepcid  Insomnia: Benadryl (alcohol free) 25mg  every 6 hours as needed Tylenol PM Unisom, no Gelcaps  Leg Cramps: Tums MagGel  Nausea/Vomiting:  Bonine Dramamine Emetrol Ginger extract Sea bands Meclizine  Nausea medication to take during pregnancy:  Unisom (doxylamine succinate 25 mg tablets) Take one tablet daily at bedtime. If symptoms are not adequately controlled, the dose can be increased to a maximum recommended dose of two tablets daily (1/2 tablet in the morning, 1/2 tablet mid-afternoon and one at bedtime). Vitamin B6 100mg  tablets. Take one tablet  twice a day (up to 200 mg per day).  Skin Rashes: Aveeno products Benadryl cream or 25mg  every 6 hours as needed Calamine Lotion 1% cortisone cream  Yeast infection: Gyne-lotrimin 7 Monistat 7   **If taking multiple medications, please check labels to avoid duplicating the same active ingredients **take medication as directed on the label ** Do not exceed 4000 mg of tylenol in 24 hours **Do not take medications that contain aspirin or ibuprofen

## 2019-02-18 NOTE — MAU Provider Note (Signed)
History     CSN: [id number]  Arrival date and time: 02/18/19 1431   First Provider Initiated Contact with Patient 02/18/19 1536      Chief Complaint  Patient presents with  . Abdominal Pain   [name] is a 28 y.o. [id number] at [redacted]w[redacted]d who presents to MAU for pelvic pressure.  Onset: today AM Location: pelvis/vagina Duration: <24hrs Character: pressure, "feels like your tampon is inside" Aggravating/Associated: none/none Relieving: walking, drinking water Treatment: none Severity: 0/10  Pt denies VB, vaginal discharge/odor/itching. Pt denies N/V, abdominal pain, constipation, diarrhea, or urinary problems. Pt denies fever, chills, fatigue, sweating or changes in appetite. Pt denies SOB or chest pain. Pt denies dizziness, HA, light-headedness, weakness.  Problems this pregnancy include: hx of recurrent miscarriage, incompetent cervix. Allergies? Flagyl, Darvocet, Latex Current medications/supplements? none Prenatal care provider? [facility], does not yet have appt scheduled   OB History    Gravida  5   Para  1   Term      Preterm  1   AB  3   Living  0     SAB  3   TAB      Ectopic  0   Multiple      Live Births  1           Past Medical History:  Diagnosis Date  . Anemia   . Anxiety   . Depression   . History of MRSA infection 2012   buttock  . Iron deficiency anemia due to chronic blood loss 02/11/2015  . Menometrorrhagia 02/11/2015  . Metacarpal bone fracture 12/2013   left small  . Migraines    "a few times/yr" (01/07/2015)  . Miscarriage   . PTSD (post-traumatic stress disorder)   . Spinal headache   . Von Willebrand disease (HCC)   . Von Willebrand disease, type 1a (HCC) 02/11/2015    Past Surgical History:  Procedure Laterality Date  . CERVICAL CERCLAGE N/A 06/13/2013   Procedure: CERCLAGE CERVICAL;  Surgeon: Kathreen Cosier, MD;  Location: [facility];  Service: Gynecology;  Laterality: N/A;  . CLOSED REDUCTION  METACARPAL WITH PERCUTANEOUS PINNING Left 12/25/2013   Procedure: LEFT SMALL METACARPAL CLOSED REDUCTION PERCUTANEOUS PINNING ;  Surgeon: Betha Loa, MD;  Location: [facility];  Service: Orthopedics;  Laterality: Left;  . DILATION AND CURETTAGE OF UTERUS N/A 08/23/2013   Procedure: Dilatation and Currettage with Ultrasound Guidance;  Surgeon: Kathreen Cosier, MD;  Location: [facility];  Service: Gynecology;  Laterality: N/A;  . DILATION AND CURETTAGE OF UTERUS  06/2007  . DILATION AND EVACUATION N/A 01/24/2013   Procedure: DILATATION AND EVACUATION;  Surgeon: Catalina Antigua, MD;  Location: [facility];  Service: Gynecology;  Laterality: N/A;  . DILATION AND EVACUATION N/A 09/11/2014   Procedure: DILATATION AND EVACUATION;  Surgeon: Lavina Hamman, MD;  Location: [facility];  Service: Gynecology;  Laterality: N/A;  . FRACTURE SURGERY    . TONSILLECTOMY N/A 12/30/2014   Procedure: CONTROL OF POST TONSILLAR BLEED;  Surgeon: Osborn Coho, MD;  Location: [facility];  Service: ENT;  Laterality: N/A;  . TONSILLECTOMY  01/07/2015   control of post tonsillar bleed  . TONSILLECTOMY N/A 01/07/2015   Procedure: CONTROL TONSIL BLEED;  Surgeon: Melvenia Beam, MD;  Location: [facility] OR;  Service: ENT;  Laterality: N/A;  . TONSILLECTOMY AND ADENOIDECTOMY N/A 12/20/2014   Procedure: CONTROL TONSIL  BLEED;  Surgeon: Flo Shanks, MD;  Location: [facility] OR;  Service: ENT;  Laterality: N/A;  Family History  Problem Relation Age of Onset  . Hypertension Mother   . Hypertension Father   . Diabetes Father   . Vision loss Father     Social History   Tobacco Use  . Smoking status: Former Smoker    Years: 1.00    Types: Cigars  . Smokeless tobacco: Never Used  Substance Use Topics  . Alcohol use: Not Currently    Alcohol/week: 0.0 standard drinks    Comment: occ   . Drug use: Not Currently    Frequency: 7.0 times per week    Types: Marijuana    Comment: last night    Allergies:  Allergies  Allergen  Reactions  . Flagyl [Metronidazole] Shortness Of Breath  . Darvocet [Propoxyphene N-Acetaminophen] Other (See Comments)    Joint pain   . Latex Rash    Medications Prior to Admission  Medication Sig Dispense Refill Last Dose  . lisinopril (PRINIVIL,ZESTRIL) 20 MG tablet Take 1 tablet (20 mg total) by mouth daily. 30 tablet 2   . traMADol (ULTRAM) 50 MG tablet Take 1 tramadol every 6 hours for pain not helped by Tylenol alone 20 tablet 0     Review of Systems  Constitutional: Negative for chills, diaphoresis, fatigue and fever.  Eyes: Negative for visual disturbance.  Respiratory: Negative for shortness of breath.   Cardiovascular: Negative for chest pain.  Gastrointestinal: Negative for abdominal pain, constipation, diarrhea, nausea and vomiting.  Genitourinary: Positive for pelvic pain (pressure). Negative for dysuria, flank pain, frequency, urgency, vaginal bleeding and vaginal discharge.  Neurological: Negative for dizziness, weakness, light-headedness and headaches.   Physical Exam   Blood pressure 124/76, pulse 83, temperature 99.4 F (37.4 C), temperature source Oral, resp. rate 16, height 6' (1.829 m), weight 97.2 kg, last menstrual period 12/23/2018, SpO2 99 %, unknown if currently breastfeeding.  Patient Vitals for the past 24 hrs:  BP Temp Temp src Pulse Resp SpO2 Height Weight  02/18/19 1516 124/76 -- -- 83 -- -- -- --  02/18/19 1452 (!) 142/74 99.4 F (37.4 C) Oral 85 16 99 % 6' (1.829 m) 97.2 kg   Physical Exam  Constitutional: She is oriented to person, place, and time. She appears well-developed and well-nourished. No distress.  HENT:  Head: Normocephalic and atraumatic.  Respiratory: Effort normal.  GI: Soft. She exhibits no distension and no mass. There is no abdominal tenderness. There is no rebound and no guarding.  Genitourinary:    No vaginal discharge.     Genitourinary Comments: CE: long/closed/posterior   Neurological: She is alert and oriented to  person, place, and time.  Skin: Skin is warm and dry. She is not diaphoretic.  Psychiatric: She has a normal mood and affect. Her behavior is normal. Judgment and thought content normal.   Results for orders placed or performed during the hospital encounter of 02/18/19 (from the past 24 hour(s))  Urinalysis, Routine w reflex microscopic     Status: Abnormal   Collection Time: 02/18/19  2:45 PM  Result Value Ref Range   Color, Urine YELLOW YELLOW   APPearance CLEAR CLEAR   Specific Gravity, Urine <1.005 (L) 1.005 - 1.030   pH 7.0 5.0 - 8.0   Glucose, UA NEGATIVE NEGATIVE mg/dL   Hgb urine dipstick NEGATIVE NEGATIVE   Bilirubin Urine NEGATIVE NEGATIVE   Ketones, ur NEGATIVE NEGATIVE mg/dL   Protein, ur NEGATIVE NEGATIVE mg/dL   Nitrite NEGATIVE NEGATIVE   Leukocytes,Ua NEGATIVE NEGATIVE  Pregnancy, urine POC  Status: Abnormal   Collection Time: 02/18/19  2:45 PM  Result Value Ref Range   Preg Test, Ur POSITIVE (A) NEGATIVE  CBC     Status: Abnormal   Collection Time: 02/18/19  3:40 PM  Result Value Ref Range   WBC 11.5 (H) 4.0 - 10.5 K/uL   RBC 4.87 3.87 - 5.11 MIL/uL   Hemoglobin 14.9 12.0 - 15.0 g/dL   HCT 43.2 36.0 - 46.0 %   MCV 88.7 80.0 - 100.0 fL   MCH 30.6 26.0 - 34.0 pg   MCHC 34.5 30.0 - 36.0 g/dL   RDW 12.6 11.5 - 15.5 %   Platelets 292 150 - 400 K/uL   nRBC 0.0 0.0 - 0.2 %  Comprehensive metabolic panel     Status: None   Collection Time: 02/18/19  3:40 PM  Result Value Ref Range   Sodium 136 135 - 145 mmol/L   Potassium 4.4 3.5 - 5.1 mmol/L   Chloride 102 98 - 111 mmol/L   CO2 25 22 - 32 mmol/L   Glucose, Bld 97 70 - 99 mg/dL   BUN 7 6 - 20 mg/dL   Creatinine, Ser 0.84 0.44 - 1.00 mg/dL   Calcium 9.4 8.9 - 10.3 mg/dL   Total Protein 6.9 6.5 - 8.1 g/dL   Albumin 3.5 3.5 - 5.0 g/dL   AST 17 15 - 41 U/L   ALT 20 0 - 44 U/L   Alkaline Phosphatase 53 38 - 126 U/L   Total Bilirubin 0.8 0.3 - 1.2 mg/dL   GFR calc non Af Amer >60 >60 mL/min   GFR calc Af  Amer >60 >60 mL/min   Anion gap 9 5 - 15  hCG, quantitative, pregnancy     Status: Abnormal   Collection Time: 02/18/19  3:40 PM  Result Value Ref Range   hCG, Beta Chain, Quant, S 83,537 (H) <5 mIU/mL   US OB LESS THAN 14 WEEKS WITH OB TRANSVAGINAL  Result Date: 02/18/2019 CLINICAL DATA:  Pelvic pain and pressure. First trimester pregnancy. EXAM: OBSTETRIC <14 WK [place] AND TRANSVAGINAL OB US TECHNIQUE: Both transabdominal and transvaginal ultrasound examinations were performed for complete evaluation of the gestation as well as the maternal uterus, adnexal regions, and pelvic cul-de-sac. Transvaginal technique was performed to assess early pregnancy. COMPARISON:  None. FINDINGS: Intrauterine gestational sac: Single Yolk sac:  Visualized. Embryo:  Visualized. Cardiac Activity: Visualized. Heart Rate: 123 bpm CRL:  8 mm   6 w   5 d                  [place] EDC: 10/09/2019 Subchorionic hemorrhage:  Small subchorionic hemorrhage. Maternal uterus/adnexae: Retroflexed uterus. Normal appearance of both ovaries. Small right ovarian corpus luteum cyst noted. No mass or abnormal free fluid identified. IMPRESSION: Single living IUP with estimated gestational age of [redacted] weeks 5 days, and [place] EDC of 10/09/2019. Small subchorionic hemorrhage noted. Retroflexed uterus. Electronically Signed   By: Marlaine Hind M.D.   On: 02/18/2019 17:33   MAU Course  Procedures  MDM -r/o ectopic -UA: SG <1.005 -CBC: WNL -CMP: WNL -[place]: single IUP, FHR 123, [redacted]w[redacted]d, sm [place], retroflexed uterus -hCG: 54,627 -ABO: O Positive -WetPrep: pending at discharge -GC/CT collected -pt discharged to home in stable condition  Orders Placed This Encounter  Procedures  . Wet prep, genital    Standing Status:   Standing    Number of Occurrences:   1  . US OB LESS THAN 14 WEEKS WITH OB  TRANSVAGINAL    Standing Status:   Standing    Number of Occurrences:   1    Order Specific Question:   Symptom/Reason for Exam    Answer:   Pelvic pressure in  pregnancy [368500]  . Urinalysis, Routine w reflex microscopic    Standing Status:   Standing    Number of Occurrences:   1  . CBC    Standing Status:   Standing    Number of Occurrences:   1  . Comprehensive metabolic panel    Standing Status:   Standing    Number of Occurrences:   1  . hCG, quantitative, pregnancy    Standing Status:   Standing    Number of Occurrences:   1  . Pregnancy, urine POC    Standing Status:   Standing    Number of Occurrences:   1  . Discharge patient    Order Specific Question:   Discharge disposition    Answer:   01-Home or Self Care [1]    Order Specific Question:   Discharge patient date    Answer:   02/18/2019   Assessment and Plan   1. Subchorionic hemorrhage of placenta in first trimester, single or unspecified fetus   2. Pelvic pressure in pregnancy   3. [redacted] weeks gestation of pregnancy   4. Blood type, Rh positive    Allergies as of 02/18/2019      Reactions   Flagyl [metronidazole] Shortness Of Breath   Darvocet [propoxyphene N-acetaminophen] Other (See Comments)   Joint pain    Latex Rash      Medication List    TAKE these medications   lisinopril 20 MG tablet Commonly known as: ZESTRIL Take 1 tablet (20 mg total) by mouth daily.   traMADol 50 MG tablet Commonly known as: ULTRAM Take 1 tramadol every 6 hours for pain not helped by Tylenol alone      -will call with culture results, if positive -start [facility] ASAP to discuss cerclage -discussed normal expectations with [facility] -safe meds in pregnancy list given -return MAU precautions discussed -pt discharged to home in stable condition  Joni Reining E Shirla Hodgkiss 02/18/2019, 6:04 PM

## 2019-02-20 LAB — GC/CHLAMYDIA PROBE AMP (~~LOC~~) NOT AT ARMC
Chlamydia: NEGATIVE
Comment: NEGATIVE
Comment: NORMAL
Neisseria Gonorrhea: NEGATIVE

## 2019-02-22 ENCOUNTER — Inpatient Hospital Stay (HOSPITAL_COMMUNITY)
Admission: AD | Admit: 2019-02-22 | Discharge: 2019-02-22 | Disposition: A | Discharge status: Home / Self Care | Payer: Self-pay | Attending: Family Medicine | Admitting: Family Medicine

## 2019-02-22 ENCOUNTER — Inpatient Hospital Stay (HOSPITAL_COMMUNITY): Payer: Self-pay

## 2019-02-22 ENCOUNTER — Encounter (HOSPITAL_COMMUNITY): Payer: Self-pay | Admitting: Family Medicine

## 2019-02-22 ENCOUNTER — Other Ambulatory Visit: Payer: Self-pay

## 2019-02-22 DIAGNOSIS — Z87891 Personal history of nicotine dependence: Secondary | ICD-10-CM | POA: Insufficient documentation

## 2019-02-22 DIAGNOSIS — O99111 Other diseases of the blood and blood-forming organs and certain disorders involving the immune mechanism complicating pregnancy, first trimester: Secondary | ICD-10-CM | POA: Insufficient documentation

## 2019-02-22 DIAGNOSIS — O021 Missed abortion: Secondary | ICD-10-CM

## 2019-02-22 DIAGNOSIS — Z3A01 Less than 8 weeks gestation of pregnancy: Secondary | ICD-10-CM | POA: Insufficient documentation

## 2019-02-22 DIAGNOSIS — D680 Von Willebrand's disease: Secondary | ICD-10-CM | POA: Insufficient documentation

## 2019-02-22 DIAGNOSIS — Z8614 Personal history of Methicillin resistant Staphylococcus aureus infection: Secondary | ICD-10-CM | POA: Insufficient documentation

## 2019-02-22 DIAGNOSIS — D6801 Von willebrand disease, type 1: Secondary | ICD-10-CM

## 2019-02-22 DIAGNOSIS — O209 Hemorrhage in early pregnancy, unspecified: Secondary | ICD-10-CM | POA: Insufficient documentation

## 2019-02-22 NOTE — MAU Note (Signed)
States she started bleeding at 0945 yesterday-was told this weekend she should expect bleeding d/t a subchorionic hematoma.  Started having chills on Monday morning and feeling off-states it completely resolved after she started bleeding.  Abdominal cramping that was mild started with the bleeding yesterday.  Passed something that she isn't sure if it's tissue or clot.  No pain/pressure now.

## 2019-02-22 NOTE — Discharge Instructions (Signed)
Incomplete Miscarriage A miscarriage is the loss of an unborn baby (fetus) before the 20th week of pregnancy. In an incomplete miscarriage, parts of the fetus or placenta (afterbirth) remain in the body. Most miscarriages happen in the first 3 months of pregnancy. Sometimes, it happens before a woman even knows she is pregnant. Having a miscarriage can be an emotional experience. If you have had a miscarriage, talk with your health care provider about any questions you may have about miscarrying, the grieving process, and your future pregnancy plans. What are the causes? This condition may be caused by:  Problems with the genes or chromosomes that make it impossible for the baby to develop normally. These problems are most often the result of random errors that occur early in development, and are not passed from parent to child (not inherited).  Infection of the cervix or uterus.  Conditions that affect hormone balance in the body.  Problems with the cervix, such as the cervix opening and thinning before pregnancy is at term (cervical insufficiency).  Problems with the uterus, such as a uterus with an abnormal shape, fibroids in the uterus, or problems that were present from birth (congenital abnormalities).  Certain medical conditions.  Smoking, drinking alcohol, or using drugs.  Injury (trauma). Many times, the cause of a miscarriage is not known. What are the signs or symptoms? Symptoms of this condition include:  Vaginal bleeding or spotting, with or without cramps or pain.  Pain or cramping in the abdomen or lower back.  Passing fluid, tissue, or blood clots from the vagina. How is this diagnosed? This condition may be diagnosed based on:  A physical exam.  Ultrasound.  Blood tests.  Urine tests. How is this treated? An incomplete miscarriage may be treated with:  Dilation and curettage (D&C). This is a procedure in which the cervix is stretched open and the lining of  the uterus (endometrium) is scraped to remove any remaining tissue from the pregnancy.  Medicines, such as: ? Antibiotic medicine to treat infection. ? Medicine to help any remaining tissue pass out of your uterus. ? Medicine to reduce (contract) the size of the uterus. These medicines may be given if you have a lot of bleeding. If you have Rh negative blood and your baby was Rh positive, you will need a shot of medicine called Rh immunoglobulinto protect future babies from Rh blood problems. "Rh-negative" and "Rh-positive" refer to whether or not the blood has a specific protein found on the surface of red blood cells (Rh factor). Follow these instructions at home: Medicines   Take over-the-counter and prescription medicines only as told by your health care provider.  If you were prescribed antibiotic medicine, take your antibiotic as told by your health care provider. Do not stop taking the antibiotic even if you start to feel better.  Do not take NSAIDs, such as aspirin and ibuprofen, unless approved by your doctor. These medicines can cause bleeding. Activity  Rest as directed. Ask your health care provider what activities are safe for you.  Have someone help with home and family responsibilities during this time. General instructions  Keep track of the number of sanitary pads you use each day and how soaked (saturated) they are. Write down this information.  Monitor the amount of tissue or blood clots that you pass from your vagina. Save any large amounts of tissue for your health care provider to examine.  Do not use tampons, douche, or have sex until your health care provider   approves.  To help you and your partner with the process of grieving, talk with your health care provider or seek counseling to help cope with the pregnancy loss.  When you are ready, meet with your health care provider to discuss important steps you should take for your health, as well as steps to take in  order to have a healthy pregnancy in the future.  Keep all follow-up visits as told by your health care provider. This is important. Where to find more information  The American Congress of Obstetricians and Gynecologists: www.acog.org  [place] Department of Health and Human Services Office of Women's Health: www.womenshealth.gov Contact a health care provider if:  You have a fever or chills.  You have a foul smelling vaginal discharge. Get help right away if:  You have severe cramps or pain in your back or abdomen.  You pass walnut-sized (or larger) blood clots or tissue from your vagina.  You have heavy bleeding, soaking more than 1 regular sanitary pad in an hour.  You become lightheaded or weak.  You pass out.  You have feelings of sadness that take over your thoughts, or you have thoughts of hurting yourself. Summary  In an incomplete miscarriage, parts of the fetus or placenta (afterbirth) remain in the body.  There are multiple treatment options for an incomplete miscarriage, talk to your health care provider about the best option for you.  Follow your health care provider's instructions for follow-up care.  To help you and your partner with the process of grieving, talk with your health care provider or seek counseling to help cope with the pregnancy loss. This information is not intended to replace advice given to you by your health care provider. Make sure you discuss any questions you have with your health care provider. Document Revised: 02/11/2017 Document Reviewed: 02/12/2016 Elsevier Patient Education  2020 [employer]  

## 2019-02-22 NOTE — MAU Provider Note (Signed)
Chief Complaint: Vaginal Bleeding   First Provider Initiated Contact with Patient 02/22/19 0403        SUBJECTIVE HPI: Taylor Flowers is a 29 y.o. G5P0130 at [redacted]w[redacted]d by LMP who presents to maternity admissions reporting vaginal bleeding and passed a clot/tissue.  Has mild cramping.  Thinks it is a miscarriage.  Has a history of multiple losses.  Was diagnosed with a small subchorionic hemorrhage 5 days ago. She denies vaginal itching/burning, urinary symptoms, h/a, dizziness, n/v, or fever/chills.    Vaginal Bleeding The patient's primary symptoms include pelvic pain and vaginal bleeding. The patient's pertinent negatives include no genital itching, genital lesions or genital odor. This is a new problem. The current episode started today. The problem occurs intermittently. The problem has been unchanged. The pain is mild. She is pregnant. Associated symptoms include abdominal pain. Pertinent negatives include no chills, constipation, diarrhea, dysuria, fever, nausea or vomiting. The vaginal discharge was bloody. The vaginal bleeding is spotting. She has been passing clots. She has been passing tissue. Nothing aggravates the symptoms. She has tried nothing for the symptoms.   RN note: States she started bleeding at 0945 yesterday-was told this weekend she should expect bleeding d/t a subchorionic hematoma.  Started having chills on Monday morning and feeling off-states it completely resolved after she started bleeding.  Abdominal cramping that was mild started with the bleeding yesterday.  Passed something that she isn't sure if it's tissue or clot.  No pain/pressure now  Past Medical History:  Diagnosis Date  . Anemia   . Anxiety   . Depression   . History of MRSA infection 2012   buttock  . Iron deficiency anemia due to chronic blood loss 02/11/2015  . Menometrorrhagia 02/11/2015  . Metacarpal bone fracture 12/2013   left small  . Migraines    "a few times/yr" (01/07/2015)  . Miscarriage   .  PTSD (post-traumatic stress disorder)   . Spinal headache   . Von Willebrand disease (HCC)   . Von Willebrand disease, type 1a (HCC) 02/11/2015   Past Surgical History:  Procedure Laterality Date  . CERVICAL CERCLAGE N/A 06/13/2013   Procedure: CERCLAGE CERVICAL;  Surgeon: Kathreen Cosier, MD;  Location: [facility];  Service: Gynecology;  Laterality: N/A;  . CLOSED REDUCTION METACARPAL WITH PERCUTANEOUS PINNING Left 12/25/2013   Procedure: LEFT SMALL METACARPAL CLOSED REDUCTION PERCUTANEOUS PINNING ;  Surgeon: Betha Loa, MD;  Location: [facility];  Service: Orthopedics;  Laterality: Left;  . DILATION AND CURETTAGE OF UTERUS N/A 08/23/2013   Procedure: Dilatation and Currettage with Ultrasound Guidance;  Surgeon: Kathreen Cosier, MD;  Location: [facility];  Service: Gynecology;  Laterality: N/A;  . DILATION AND CURETTAGE OF UTERUS  06/2007  . DILATION AND EVACUATION N/A 01/24/2013   Procedure: DILATATION AND EVACUATION;  Surgeon: Catalina Antigua, MD;  Location: [facility];  Service: Gynecology;  Laterality: N/A;  . DILATION AND EVACUATION N/A 09/11/2014   Procedure: DILATATION AND EVACUATION;  Surgeon: Lavina Hamman, MD;  Location: [facility];  Service: Gynecology;  Laterality: N/A;  . FRACTURE SURGERY    . TONSILLECTOMY N/A 12/30/2014   Procedure: CONTROL OF POST TONSILLAR BLEED;  Surgeon: Osborn Coho, MD;  Location: [facility] OR;  Service: ENT;  Laterality: N/A;  . TONSILLECTOMY  01/07/2015   control of post tonsillar bleed  . TONSILLECTOMY N/A 01/07/2015   Procedure: CONTROL TONSIL BLEED;  Surgeon: Melvenia Beam, MD;  Location: [facility] OR;  Service: ENT;  Laterality: N/A;  . TONSILLECTOMY AND  ADENOIDECTOMY N/A 12/20/2014   Procedure: CONTROL TONSIL  BLEED;  Surgeon: Flo Shanks, MD;  Location: [facility] OR;  Service: ENT;  Laterality: N/A;   Social History   Socioeconomic History  . Marital status: Single    Spouse name: Not on file  . Number of children: Not on file  . Years of education: Not on  file  . Highest education level: Not on file  Occupational History  . Not on file  Tobacco Use  . Smoking status: Former Smoker    Years: 1.00    Types: Cigars  . Smokeless tobacco: Never Used  Substance and Sexual Activity  . Alcohol use: Not Currently    Alcohol/week: 0.0 standard drinks    Comment: occ   . Drug use: Not Currently    Frequency: 7.0 times per week    Types: Marijuana    Comment: last night  . Sexual activity: Not Currently    Birth control/protection: None  Other Topics Concern  . Not on file  Social History Narrative  . Not on file   Social Determinants of Health   Financial Resource Strain:   . Difficulty of Paying Living Expenses: Not on file  Food Insecurity:   . Worried About Programme researcher, broadcasting/film/video in the Last Year: Not on file  . Ran Out of Food in the Last Year: Not on file  Transportation Needs:   . Lack of Transportation (Medical): Not on file  . Lack of Transportation (Non-Medical): Not on file  Physical Activity:   . Days of Exercise per Week: Not on file  . Minutes of Exercise per Session: Not on file  Stress:   . Feeling of Stress : Not on file  Social Connections:   . Frequency of Communication with Friends and Family: Not on file  . Frequency of Social Gatherings with Friends and Family: Not on file  . Attends Religious Services: Not on file  . Active Member of Clubs or Organizations: Not on file  . Attends Banker Meetings: Not on file  . Marital Status: Not on file  Intimate Partner Violence:   . Fear of Current or Ex-Partner: Not on file  . Emotionally Abused: Not on file  . Physically Abused: Not on file  . Sexually Abused: Not on file   No current facility-administered medications on file prior to encounter.   Current Outpatient Medications on File Prior to Encounter  Medication Sig Dispense Refill  . lisinopril (PRINIVIL,ZESTRIL) 20 MG tablet Take 1 tablet (20 mg total) by mouth daily. 30 tablet 2  . traMADol  (ULTRAM) 50 MG tablet Take 1 tramadol every 6 hours for pain not helped by Tylenol alone 20 tablet 0   Allergies  Allergen Reactions  . Flagyl [Metronidazole] Shortness Of Breath  . Darvocet [Propoxyphene N-Acetaminophen] Other (See Comments)    Joint pain   . Latex Rash    I have reviewed patient's Past Medical Hx, Surgical Hx, Family Hx, Social Hx, medications and allergies.   ROS:  Review of Systems  Constitutional: Negative for chills and fever.  Gastrointestinal: Positive for abdominal pain. Negative for constipation, diarrhea, nausea and vomiting.  Genitourinary: Positive for pelvic pain and vaginal bleeding. Negative for dysuria.   Review of Systems  Other systems negative   Physical Exam  Physical Exam Patient Vitals for the past 24 hrs:  BP Temp Pulse Resp Height Weight  02/22/19 0343 136/80 98.6 F (37 C) 83 19 6' (1.829 m) (!) 141.7 kg  Constitutional: Well-developed, well-nourished female in no acute distress.  Cardiovascular: normal rate Respiratory: normal effort GI: Abd soft, non-tender. Pos BS x 4 MS: Extremities nontender, no edema, normal ROM Neurologic: Alert and oriented x 4.  GU: Neg CVAT.  PELVIC EXAM: scant bloody discharge, No hemorrhage,  vaginal walls and external genitalia normal Bimanual exam: deferred due to recent exam   LAB RESULTS No results found for this or any previous visit (from the past 24 hour(s)).    IMAGING US OB Transvaginal  Result Date: 02/22/2019 CLINICAL DATA:  Vaginal bleeding and first-trimester pregnancy EXAM: TRANSVAGINAL OB ULTRASOUND TECHNIQUE: Transvaginal ultrasound was performed for complete evaluation of the gestation as well as the maternal uterus, adnexal regions, and pelvic cul-de-sac. COMPARISON:  02/18/2019 FINDINGS: Intrauterine gestational sac: Single Yolk sac:  Visualized. Embryo:  Visualized. Cardiac Activity: Not Visualized. CRL:   10.3 mm   7 w 1 d Subchorionic hemorrhage:  None visualized. Maternal  uterus/adnexae: Retroflexed uterus. Corpus luteum noted on the right. No complex or concerning pelvic fluid. IMPRESSION: Failed pregnancy; cardiac activity is no longer visualized. No subchorionic hemorrhage noted today. Electronically Signed   By: Monte Fantasia M.D.   On: 02/22/2019 04:43    MAU Management/MDM: Ordered repeat US when I was unable to see clearly on bedside US [place] results showed no heart beat with the embryo, previously seen HR  [place] no longer seen, possibly was the tissue that she passed  Discussed results.  Pt states she is not surprised.  Options reviewed including Expectant management, Cytotec and D&C.  She wants to avoid natural SAB or cytotec due to potential for bleeding with Von Willebrands She  Prefers a D&C.  I told her I would message the clinic to schedule and that the MD would decide when it would be most appropriate to do the procedure.   ASSESSMENT Single intrauterine pregnancy at [redacted]w[redacted]d Intrauterine embryonic demise Von Willebrands disease, no current hemorrhage  PLAN Discharge home Message sent to clinic and cc to Dr Nehemiah Settle for guidance on [facility]. Bleeding precautions.  Pt stable at time of discharge. Encouraged to return here or to other Urgent Care/ED if she develops worsening of symptoms, increase in pain, fever, or other concerning symptoms.    Hansel Feinstein CNM, MSN Certified Nurse-Midwife 02/22/2019  4:03 AM

## 2019-02-23 ENCOUNTER — Other Ambulatory Visit: Payer: Self-pay

## 2019-02-23 ENCOUNTER — Encounter (HOSPITAL_COMMUNITY): Payer: Self-pay | Admitting: Obstetrics & Gynecology

## 2019-02-23 ENCOUNTER — Other Ambulatory Visit (HOSPITAL_COMMUNITY)
Admission: RE | Admit: 2019-02-23 | Discharge: 2019-02-23 | Disposition: A | Discharge status: Home / Self Care | Payer: HRSA Program | Source: Ambulatory Visit | Attending: Obstetrics & Gynecology | Admitting: Obstetrics & Gynecology

## 2019-02-23 DIAGNOSIS — Z01812 Encounter for preprocedural laboratory examination: Secondary | ICD-10-CM | POA: Diagnosis present

## 2019-02-23 DIAGNOSIS — Z20822 Contact with and (suspected) exposure to covid-19: Secondary | ICD-10-CM | POA: Insufficient documentation

## 2019-02-23 LAB — SARS CORONAVIRUS 2 (TAT 6-24 HRS): SARS Coronavirus 2: NEGATIVE

## 2019-02-23 NOTE — Anesthesia Preprocedure Evaluation (Addendum)
Anesthesia Evaluation  Patient identified by MRN, date of birth, ID band Patient awake    Reviewed: Allergy & Precautions, NPO status , Patient's Chart, lab work & pertinent test results  Airway Mallampati: II  TM Distance: >3 FB Neck ROM: Full    Dental no notable dental hx. (+) Teeth Intact, Dental Advisory Given,    Pulmonary former smoker,    Pulmonary exam normal breath sounds clear to auscultation       Cardiovascular Exercise Tolerance: Good hypertension, Normal cardiovascular exam Rhythm:Regular Rate:Normal     Neuro/Psych  Headaches, negative psych ROS   GI/Hepatic negative GI ROS, Neg liver ROS,   Endo/Other  negative endocrine ROS  Renal/GU negative Renal ROSK+ 4.4 Cr 0.84     Musculoskeletal   Abdominal   Peds  Hematology Hgb 14.9 Plt 292    Anesthesia Other Findings   Reproductive/Obstetrics                           Anesthesia Physical Anesthesia Plan  ASA: II  Anesthesia Plan: General   Post-op Pain Management:    Induction: Intravenous  PONV Risk Score and Plan: 4 or greater and Treatment may vary due to age or medical condition, Ondansetron, Dexamethasone and Midazolam  Airway Management Planned: LMA  Additional Equipment:   Intra-op Plan:   Post-operative Plan:   Informed Consent: I have reviewed the patients History and Physical, chart, labs and discussed the procedure including the risks, benefits and alternatives for the proposed anesthesia with the patient or authorized representative who has indicated his/her understanding and acceptance.     Dental advisory given  Plan Discussed with:   Anesthesia Plan Comments: (LMA)       Anesthesia Quick Evaluation

## 2019-02-23 NOTE — Progress Notes (Signed)
Patient denies shortness of breath, fever, cough and chest pain.  PCP - none Cardiologist - denies  Chest x-ray - denies EKG - denies Stress Test - denies ECHO - denies Cardiac Cath - denies  ERAS - Clears til 10 am DOS, no drink.  Anesthesia review: Fayrene Fearing, [place] reviewed chart.  Patient's surgery was moved from at [facility] per Dr Clemens Catholic due to patient's history of Von Willebrand Disease and risk for bleeding.  T & S on DOS per [place], [place].  STOP now taking any Aspirin (unless otherwise instructed by your surgeon), Aleve, Naproxen, Ibuprofen, Motrin, Advil, Goody's, BC's, all herbal medications, fish oil, and all vitamins.   Coronavirus Screening Have you experienced the following symptoms:  Cough yes/no: No Fever (>100.45F)  yes/no: No Runny nose yes/no: No Sore throat yes/no: No Difficulty breathing/shortness of breath  yes/no: No  Have you traveled in the last 14 days and where? yes/no: No  Patient verbalized understanding of instructions that were given via phone.

## 2019-02-23 NOTE — Progress Notes (Signed)
PMH, oncology notes relating to pts Von Willebrand disesase reviewed with Dr Clemens Catholic. Pt's surgery scheduled for 2/5 will need to be moved to [facility] main OR. Spoke with Rhea Bleacher, will move to main.

## 2019-02-24 ENCOUNTER — Encounter (HOSPITAL_COMMUNITY): Payer: Self-pay | Admitting: Obstetrics & Gynecology

## 2019-02-24 ENCOUNTER — Other Ambulatory Visit: Payer: Self-pay

## 2019-02-24 ENCOUNTER — Ambulatory Visit (HOSPITAL_COMMUNITY): Payer: Self-pay | Admitting: Physician Assistant

## 2019-02-24 ENCOUNTER — Ambulatory Visit (HOSPITAL_COMMUNITY)
Admission: RE | Admit: 2019-02-24 | Discharge: 2019-02-24 | Disposition: A | Discharge status: Home / Self Care | Payer: Self-pay | Attending: Obstetrics & Gynecology | Admitting: Obstetrics & Gynecology

## 2019-02-24 ENCOUNTER — Encounter (HOSPITAL_COMMUNITY)
Admission: RE | Disposition: A | Discharge status: Home / Self Care | Payer: Self-pay | Source: Home / Self Care | Attending: Obstetrics & Gynecology

## 2019-02-24 ENCOUNTER — Ambulatory Visit (HOSPITAL_COMMUNITY): Payer: Self-pay

## 2019-02-24 DIAGNOSIS — O99111 Other diseases of the blood and blood-forming organs and certain disorders involving the immune mechanism complicating pregnancy, first trimester: Secondary | ICD-10-CM | POA: Insufficient documentation

## 2019-02-24 DIAGNOSIS — Z8614 Personal history of Methicillin resistant Staphylococcus aureus infection: Secondary | ICD-10-CM | POA: Insufficient documentation

## 2019-02-24 DIAGNOSIS — O021 Missed abortion: Secondary | ICD-10-CM

## 2019-02-24 DIAGNOSIS — Z87891 Personal history of nicotine dependence: Secondary | ICD-10-CM | POA: Insufficient documentation

## 2019-02-24 DIAGNOSIS — Z91040 Latex allergy status: Secondary | ICD-10-CM | POA: Insufficient documentation

## 2019-02-24 DIAGNOSIS — Z8249 Family history of ischemic heart disease and other diseases of the circulatory system: Secondary | ICD-10-CM | POA: Insufficient documentation

## 2019-02-24 DIAGNOSIS — D680 Von Willebrand's disease: Secondary | ICD-10-CM | POA: Insufficient documentation

## 2019-02-24 DIAGNOSIS — D6801 Von willebrand disease, type 1: Secondary | ICD-10-CM | POA: Diagnosis present

## 2019-02-24 DIAGNOSIS — N96 Recurrent pregnancy loss: Secondary | ICD-10-CM | POA: Diagnosis present

## 2019-02-24 DIAGNOSIS — Z881 Allergy status to other antibiotic agents status: Secondary | ICD-10-CM | POA: Insufficient documentation

## 2019-02-24 DIAGNOSIS — Z885 Allergy status to narcotic agent status: Secondary | ICD-10-CM | POA: Insufficient documentation

## 2019-02-24 DIAGNOSIS — Z419 Encounter for procedure for purposes other than remedying health state, unspecified: Secondary | ICD-10-CM

## 2019-02-24 DIAGNOSIS — O161 Unspecified maternal hypertension, first trimester: Secondary | ICD-10-CM | POA: Insufficient documentation

## 2019-02-24 HISTORY — PX: DILATION AND EVACUATION: SHX1459

## 2019-02-24 HISTORY — DX: Essential (primary) hypertension: I10

## 2019-02-24 LAB — COMPREHENSIVE METABOLIC PANEL
ALT: 17 U/L (ref 0–44)
AST: 16 U/L (ref 15–41)
Albumin: 3.8 g/dL (ref 3.5–5.0)
Alkaline Phosphatase: 63 U/L (ref 38–126)
Anion gap: 10 (ref 5–15)
BUN: 8 mg/dL (ref 6–20)
CO2: 25 mmol/L (ref 22–32)
Calcium: 9 mg/dL (ref 8.9–10.3)
Chloride: 100 mmol/L (ref 98–111)
Creatinine, Ser: 0.88 mg/dL (ref 0.44–1.00)
GFR calc Af Amer: >60 mL/min (ref >60)
GFR calc non Af Amer: >60 mL/min (ref >60)
Glucose, Bld: 102 mg/dL — ABNORMAL HIGH (ref 70–99)
Potassium: 3.7 mmol/L (ref 3.5–5.1)
Sodium: 135 mmol/L (ref 135–145)
Total Bilirubin: 0.7 mg/dL (ref 0.3–1.2)
Total Protein: 7.3 g/dL (ref 6.5–8.1)

## 2019-02-24 LAB — APTT: aPTT: 29 seconds (ref 24–36)

## 2019-02-24 LAB — PREPARE RBC (CROSSMATCH)

## 2019-02-24 LAB — CBC
HCT: 46.9 % — ABNORMAL HIGH (ref 36.0–46.0)
Hemoglobin: 15.6 g/dL — ABNORMAL HIGH (ref 12.0–15.0)
MCH: 30.1 pg (ref 26.0–34.0)
MCHC: 33.3 g/dL (ref 30.0–36.0)
MCV: 90.5 fL (ref 80.0–100.0)
Platelets: 293 10*3/uL (ref 150–400)
RBC: 5.18 MIL/uL — ABNORMAL HIGH (ref 3.87–5.11)
RDW: 12.6 % (ref 11.5–15.5)
WBC: 12.4 10*3/uL — ABNORMAL HIGH (ref 4.0–10.5)
nRBC: 0 % (ref 0.0–0.2)

## 2019-02-24 LAB — PROTIME-INR
INR: 1 (ref 0.8–1.2)
Prothrombin Time: 12.8 seconds (ref 11.4–15.2)

## 2019-02-24 LAB — ABO/RH: ABO/RH(D): O POS

## 2019-02-24 SURGERY — DILATION AND EVACUATION, UTERUS
Anesthesia: General | Site: Vagina

## 2019-02-24 MED ORDER — SOD CITRATE-CITRIC ACID 500-334 MG/5ML PO SOLN
30.0000 mL | ORAL | Status: AC
  Administered 2019-02-24: 30 mL via ORAL
  Filled 2019-02-24: qty 30

## 2019-02-24 MED ORDER — PHENYLEPHRINE 40 MCG/ML (10ML) SYRINGE FOR IV PUSH (FOR BLOOD PRESSURE SUPPORT)
PREFILLED_SYRINGE | INTRAVENOUS | Status: DC | PRN
  Administered 2019-02-24 (×2): 80 ug via INTRAVENOUS

## 2019-02-24 MED ORDER — LACTATED RINGERS IV SOLN: INTRAVENOUS | Status: DC | PRN

## 2019-02-24 MED ORDER — ONDANSETRON HCL 4 MG/2ML IJ SOLN
INTRAMUSCULAR | Status: AC
  Filled 2019-02-24: qty 2

## 2019-02-24 MED ORDER — DOXYCYCLINE HYCLATE 100 MG IV SOLR
200.0000 mg | INTRAVENOUS | Status: AC
  Administered 2019-02-24: 13:00:00 200 mg via INTRAVENOUS
  Filled 2019-02-24 (×2): qty 200

## 2019-02-24 MED ORDER — OXYCODONE HCL 5 MG PO TABS: 5.0000 mg | ORAL_TABLET | ORAL | 0 refills | Status: DC | PRN

## 2019-02-24 MED ORDER — ANTIHEMOPHILIC FACTOR-VWF 250-600 UNITS IV SOLR
6920.0000 [IU] | INTRAVENOUS | Status: AC
  Administered 2019-02-24: 6920 [IU] via INTRAVENOUS
  Filled 2019-02-24: qty 6920

## 2019-02-24 MED ORDER — FENTANYL CITRATE (PF) 100 MCG/2ML IJ SOLN
INTRAMUSCULAR | Status: DC | PRN
  Administered 2019-02-24: 50 ug via INTRAVENOUS

## 2019-02-24 MED ORDER — KETOROLAC TROMETHAMINE 30 MG/ML IJ SOLN
INTRAMUSCULAR | Status: AC
  Filled 2019-02-24: qty 1

## 2019-02-24 MED ORDER — PROPOFOL 10 MG/ML IV BOLUS
INTRAVENOUS | Status: AC
  Filled 2019-02-24: qty 20

## 2019-02-24 MED ORDER — LIDOCAINE 2% (20 MG/ML) 5 ML SYRINGE
INTRAMUSCULAR | Status: AC
  Filled 2019-02-24: qty 5

## 2019-02-24 MED ORDER — DOCUSATE SODIUM 100 MG PO CAPS: 100.0000 mg | ORAL_CAPSULE | Freq: Two times a day (BID) | ORAL | 2 refills | Status: DC | PRN

## 2019-02-24 MED ORDER — GABAPENTIN 300 MG PO CAPS
300.0000 mg | ORAL_CAPSULE | ORAL | Status: AC
  Administered 2019-02-24: 300 mg via ORAL
  Filled 2019-02-24: qty 1

## 2019-02-24 MED ORDER — SODIUM CHLORIDE 0.9 % IV SOLN: INTRAVENOUS | Status: DC

## 2019-02-24 MED ORDER — OXYCODONE HCL 5 MG PO TABS
5.0000 mg | ORAL_TABLET | Freq: Once | ORAL | Status: AC | PRN
  Administered 2019-02-24: 5 mg via ORAL

## 2019-02-24 MED ORDER — OXYCODONE HCL 5 MG PO TABS: 5.0000 mg | ORAL_TABLET | Freq: Once | ORAL | 0 refills | Status: DC | PRN

## 2019-02-24 MED ORDER — ACETAMINOPHEN 500 MG PO TABS
1000.0000 mg | ORAL_TABLET | ORAL | Status: AC
  Administered 2019-02-24: 11:00:00 1000 mg via ORAL
  Filled 2019-02-24: qty 2

## 2019-02-24 MED ORDER — FENTANYL CITRATE (PF) 250 MCG/5ML IJ SOLN
INTRAMUSCULAR | Status: AC
  Filled 2019-02-24: qty 5

## 2019-02-24 MED ORDER — LACTATED RINGERS IV SOLN: INTRAVENOUS | Status: DC

## 2019-02-24 MED ORDER — DEXAMETHASONE SODIUM PHOSPHATE 10 MG/ML IJ SOLN
INTRAMUSCULAR | Status: DC | PRN
  Administered 2019-02-24: 10 mg via INTRAVENOUS

## 2019-02-24 MED ORDER — OXYCODONE HCL 5 MG PO TABS
ORAL_TABLET | ORAL | Status: AC
  Filled 2019-02-24: qty 1

## 2019-02-24 MED ORDER — ONDANSETRON HCL 4 MG/2ML IJ SOLN: 4.0000 mg | Freq: Once | INTRAMUSCULAR | Status: DC | PRN

## 2019-02-24 MED ORDER — LIDOCAINE 2% (20 MG/ML) 5 ML SYRINGE
INTRAMUSCULAR | Status: DC | PRN
  Administered 2019-02-24: 100 mg via INTRAVENOUS

## 2019-02-24 MED ORDER — DEXAMETHASONE SODIUM PHOSPHATE 10 MG/ML IJ SOLN
INTRAMUSCULAR | Status: AC
  Filled 2019-02-24: qty 1

## 2019-02-24 MED ORDER — KETOROLAC TROMETHAMINE 30 MG/ML IJ SOLN
30.0000 mg | Freq: Once | INTRAMUSCULAR | Status: AC | PRN
  Administered 2019-02-24: 30 mg via INTRAVENOUS

## 2019-02-24 MED ORDER — MIDAZOLAM HCL 2 MG/2ML IJ SOLN
INTRAMUSCULAR | Status: AC
  Filled 2019-02-24: qty 2

## 2019-02-24 MED ORDER — MIDAZOLAM HCL 5 MG/5ML IJ SOLN
INTRAMUSCULAR | Status: DC | PRN
  Administered 2019-02-24: 2 mg via INTRAVENOUS

## 2019-02-24 MED ORDER — OXYCODONE HCL 5 MG/5ML PO SOLN: 5.0000 mg | Freq: Once | ORAL | Status: AC | PRN

## 2019-02-24 MED ORDER — BUPIVACAINE HCL (PF) 0.5 % IJ SOLN
INTRAMUSCULAR | Status: AC
  Filled 2019-02-24: qty 30

## 2019-02-24 MED ORDER — PHENYLEPHRINE 40 MCG/ML (10ML) SYRINGE FOR IV PUSH (FOR BLOOD PRESSURE SUPPORT)
PREFILLED_SYRINGE | INTRAVENOUS | Status: AC
  Filled 2019-02-24: qty 10

## 2019-02-24 MED ORDER — HYDROMORPHONE HCL 1 MG/ML IJ SOLN: 0.2500 mg | INTRAMUSCULAR | Status: DC | PRN

## 2019-02-24 MED ORDER — IBUPROFEN 600 MG PO TABS: 600.0000 mg | ORAL_TABLET | Freq: Four times a day (QID) | ORAL | 2 refills | Status: DC | PRN

## 2019-02-24 MED ORDER — ONDANSETRON HCL 4 MG/2ML IJ SOLN
INTRAMUSCULAR | Status: DC | PRN
  Administered 2019-02-24: 4 mg via INTRAVENOUS

## 2019-02-24 MED ORDER — BUPIVACAINE HCL 0.5 % IJ SOLN
INTRAMUSCULAR | Status: DC | PRN
  Administered 2019-02-24: 30 mL

## 2019-02-24 MED ORDER — PROPOFOL 10 MG/ML IV BOLUS
INTRAVENOUS | Status: DC | PRN
  Administered 2019-02-24: 200 mg via INTRAVENOUS

## 2019-02-24 SURGICAL SUPPLY — 23 items
CATH ROB NEL 14FR STERILE (CATHETERS) ×3 IMPLANT
CATH ROBINSON RED A/P 16FR (CATHETERS) ×3 IMPLANT
DECANTER SPIKE VIAL GLASS SM (MISCELLANEOUS) ×3 IMPLANT
FILTER UTR ASPR ASSEMBLY (MISCELLANEOUS) ×3 IMPLANT
GLOVE BIOGEL PI IND STRL 7.0 (GLOVE) ×1 IMPLANT
GLOVE BIOGEL PI INDICATOR 7.0 (GLOVE) ×2
GLOVE ECLIPSE 7.0 STRL STRAW (GLOVE) ×3 IMPLANT
GOWN STRL REUS W/ TWL LRG LVL3 (GOWN DISPOSABLE) ×2 IMPLANT
GOWN STRL REUS W/TWL LRG LVL3 (GOWN DISPOSABLE) ×4
HOSE CONNECTING 18IN BERKELEY (TUBING) ×3 IMPLANT
KIT BERKELEY 1ST TRI 3/8 NO TR (MISCELLANEOUS) ×3 IMPLANT
KIT BERKELEY 1ST TRIMESTER 3/8 (MISCELLANEOUS) ×6 IMPLANT
NS IRRIG 1000ML POUR BTL (IV SOLUTION) ×3 IMPLANT
PACK VAGINAL MINOR WOMEN LF (CUSTOM PROCEDURE TRAY) ×3 IMPLANT
PAD OB MATERNITY 4.3X12.25 (PERSONAL CARE ITEMS) ×3 IMPLANT
SET BERKELEY SUCTION TUBING (SUCTIONS) ×3 IMPLANT
TOWEL GREEN STERILE FF (TOWEL DISPOSABLE) ×6 IMPLANT
UNDERPAD 30X36 HEAVY ABSORB (UNDERPADS AND DIAPERS) ×3 IMPLANT
VACURETTE 10 RIGID CVD (CANNULA) IMPLANT
VACURETTE 6 ASPIR F TIP BERK (CANNULA) IMPLANT
VACURETTE 7MM CVD STRL WRAP (CANNULA) IMPLANT
VACURETTE 8 RIGID CVD (CANNULA) IMPLANT
VACURETTE 9 RIGID CVD (CANNULA) IMPLANT

## 2019-02-24 NOTE — Discharge Instructions (Signed)
Dilation and Curettage or Vacuum Curettage, Care After This sheet gives you information about how to care for yourself after your procedure. Your health care provider may also give you more specific instructions. If you have problems or questions, contact your health care provider. What can I expect after the procedure? After your procedure, it is common to have:  Mild pain or cramping.  Some vaginal bleeding or spotting. These may last for up to 2 weeks after your procedure. Follow these instructions at home: Activity   Do not drive or use heavy machinery while taking prescription pain medicine.  Avoid driving for the first 24 hours after your procedure.  Take frequent, short walks, followed by rest periods, throughout the day. Ask your health care provider what activities are safe for you. After 1-2 days, you may be able to return to your normal activities.  Do not lift anything heavier than 10 lb (4.5 kg) until your health care provider approves.  For at least 2 weeks, or as long as told by your health care provider, do not: ? Douche. ? Use tampons. ? Have sexual intercourse. General instructions   Take over-the-counter and prescription medicines only as told by your health care provider. This is especially important if you take blood thinning medicine.  Do not take baths, swim, or use a hot tub until your health care provider approves. Take showers instead of baths.  Wear compression stockings as told by your health care provider. These stockings help to prevent blood clots and reduce swelling in your legs.  It is your responsibility to get the results of your procedure. Ask your health care provider, or the department performing the procedure, when your results will be ready.  Keep all follow-up visits as told by your health care provider. This is important. Contact a health care provider if:  You have severe cramps that get worse or that do not get better with  medicine.  You have severe abdominal pain.  You cannot drink fluids without vomiting.  You develop pain in a different area of your pelvis.  You have bad-smelling vaginal discharge.  You have a rash. Get help right away if:  You have vaginal bleeding that soaks more than one sanitary pad in 1 hour, for 2 hours in a row.  You pass large blood clots from your vagina.  You have a fever that is above 100.4F (38.0C).  Your abdomen feels very tender or hard.  You have chest pain.  You have shortness of breath.  You cough up blood.  You feel dizzy or light-headed.  You faint.  You have pain in your neck or shoulder area. This information is not intended to replace advice given to you by your health care provider. Make sure you discuss any questions you have with your health care provider. Document Revised: 12/18/2016 Document Reviewed: 08/08/2015 Elsevier Patient Education  2020 [employer]  

## 2019-02-24 NOTE — Anesthesia Procedure Notes (Signed)
Procedure Name: LMA Insertion Date/Time: 02/24/2019 1:02 PM Performed by: Quentin Ore, CRNA Pre-anesthesia Checklist: Patient identified, Emergency Drugs available, Suction available and Patient being monitored Patient Re-evaluated:Patient Re-evaluated prior to induction Oxygen Delivery Method: Circle system utilized Preoxygenation: Pre-oxygenation with 100% oxygen Induction Type: IV induction LMA: LMA inserted LMA Size: 4.0 Number of attempts: 1 Placement Confirmation: positive ETCO2 and breath sounds checked- equal and bilateral Tube secured with: Tape Dental Injury: Teeth and Oropharynx as per pre-operative assessment

## 2019-02-24 NOTE — H&P (Signed)
Preoperative History and Physical  Taylor Flowers is a 29 y.o. G5P0130 here for Dilation and Evacuation/Suction Dilation and Curettage under ultrasound guidance for intrauterine fetal demise around seven weeks gestation.  No significant preoperative concerns.  Past Medical History:  Diagnosis Date  . Anemia   . Anxiety    no meds  . Depression    no meds  . History of MRSA infection 2012   buttock  . Hypertension   . 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 Iron deficiency anemia due to chronic blood loss 02/11/2015  . Menometrorrhagia 02/11/2015  . Metacarpal bone fracture 12/2013   left small  . Migraines    "a few times/yr" (01/07/2015)  . Miscarriage   . Post-tonsillectomy hemorrhage 01/07/2015  . Premature rupture of membranes in pregnancy, antepartum-at 17 wks 01/21/2013  . PTSD (post-traumatic stress disorder)    no meds  . Short cervix with cervical cerclage in second trimester, antepartum 08/13/2013  . Spinal headache   . Spontaneous abortion in second trimester 08/23/2013  . Von Willebrand disease (HCC)   . Von Willebrand disease, type 1a (HCC) 02/11/2015   Past Surgical History:  Procedure Laterality Date  . CERVICAL CERCLAGE N/A 06/13/2013   Procedure: CERCLAGE CERVICAL;  Surgeon: Kathreen Cosier, MD;  Location: [facility];  Service: Gynecology;  Laterality: N/A;  . CLOSED REDUCTION METACARPAL WITH PERCUTANEOUS PINNING Left 12/25/2013   Procedure: LEFT SMALL METACARPAL CLOSED REDUCTION PERCUTANEOUS PINNING ;  Surgeon: Betha Loa, MD;  Location: [facility];  Service: Orthopedics;  Laterality: Left;  . DILATION AND CURETTAGE OF UTERUS N/A 08/23/2013   Procedure: Dilatation and Currettage with Ultrasound Guidance;  Surgeon: Kathreen Cosier, MD;  Location: [facility];  Service: Gynecology;  Laterality: N/A;  . DILATION AND CURETTAGE OF UTERUS  06/2007  . DILATION AND EVACUATION N/A 01/24/2013   Procedure: DILATATION AND EVACUATION;  Surgeon: Catalina Antigua, MD;  Location:  [facility];  Service: Gynecology;  Laterality: N/A;  . DILATION AND EVACUATION N/A 09/11/2014   Procedure: DILATATION AND EVACUATION;  Surgeon: Lavina Hamman, MD;  Location: [facility];  Service: Gynecology;  Laterality: N/A;  . FRACTURE SURGERY    . TONSILLECTOMY N/A 12/30/2014   Procedure: CONTROL OF POST TONSILLAR BLEED;  Surgeon: Osborn Coho, MD;  Location: [facility];  Service: ENT;  Laterality: N/A;  . TONSILLECTOMY  01/07/2015   control of post tonsillar bleed  . TONSILLECTOMY N/A 01/07/2015   Procedure: CONTROL TONSIL BLEED;  Surgeon: Melvenia Beam, MD;  Location: [facility] OR;  Service: ENT;  Laterality: N/A;  . TONSILLECTOMY AND ADENOIDECTOMY N/A 12/20/2014   Procedure: CONTROL TONSIL  BLEED;  Surgeon: Flo Shanks, MD;  Location: [facility] OR;  Service: ENT;  Laterality: N/A;   OB History  Gravida Para Term Preterm AB Living  5 1   1 3  0  SAB TAB Ectopic Multiple Live Births  3   0   1    # Outcome Date GA Lbr Len/2nd Weight Sex Delivery Anes PTL Lv  5 Current           4 SAB 08/2014             Complications: Missed abortion  3 Preterm 08/23/13 [redacted]w[redacted]d 01:54 / 00:08 420 g M Vag-Spont None Y ND     Birth Comments: previable     Complications: Cervical incompetence  2 SAB 01/24/13 [redacted]w[redacted]d  119 g M  None       Birth Comments: incompetent cervix  1 SAB  06/25/07 [redacted]w[redacted]d            Birth Comments: surgery  Patient denies any other pertinent gynecologic issues.   No current facility-administered medications on file prior to encounter.   Current Outpatient Medications on File Prior to Encounter  Medication Sig Dispense Refill  . lisinopril (PRINIVIL,ZESTRIL) 20 MG tablet Take 1 tablet (20 mg total) by mouth daily. (Patient not taking: Reported on 02/23/2019) 30 tablet 2  . traMADol (ULTRAM) 50 MG tablet Take 1 tramadol every 6 hours for pain not helped by Tylenol alone (Patient not taking: Reported on 02/23/2019) 20 tablet 0   Allergies  Allergen Reactions  . Flagyl [Metronidazole] Shortness Of Breath   . Darvocet [Propoxyphene N-Acetaminophen] Other (See Comments)    Joint pain   . Latex Rash    Social History:   reports that she quit smoking about 5 weeks ago. Her smoking use included cigars. She quit after 1.00 year of use. She has never used smokeless tobacco. She reports previous alcohol use. She reports previous drug use. Frequency: 7.00 times per week. Drug: Marijuana.  Family History  Problem Relation Age of Onset  . Hypertension Mother   . Hypertension Father   . Diabetes Father   . Vision loss Father     Review of Systems: Pertinent items noted in HPI and remainder of comprehensive ROS otherwise negative.  PHYSICAL EXAM: Blood pressure 134/82, pulse 88, temperature 98.7 F (37.1 C), temperature source Oral, resp. rate 18, height 6' (1.829 m), weight (!) 141.5 kg, last menstrual period 12/23/2018, SpO2 100 %, unknown if currently breastfeeding. CONSTITUTIONAL: Well-developed, well-nourished female in no acute distress.  HENT:  Normocephalic, atraumatic, External right and left ear normal. Oropharynx is clear and moist EYES: Conjunctivae and EOM are normal. Pupils are equal, round, and reactive to light. No scleral icterus.  NECK: Normal range of motion, supple, no masses SKIN: Skin is warm and dry. No rash noted. Not diaphoretic. No erythema. No pallor. NEUROLOGIC: Alert and oriented to person, place, and time. Normal reflexes, muscle tone coordination. No cranial nerve deficit noted. PSYCHIATRIC: Normal mood and affect. Normal behavior. Normal judgment and thought content. CARDIOVASCULAR: Normal heart rate noted, regular rhythm RESPIRATORY: Effort and breath sounds normal, no problems with respiration noted ABDOMEN: Soft, nontender, nondistended. PELVIC: Deferred MUSCULOSKELETAL: Normal range of motion. No edema and no tenderness. 2+ distal pulses.  Labs: Results for orders placed or performed during the hospital encounter of 02/24/19 (from the past 48 hour(s))   Prepare RBC     Status: None   Collection Time: 02/24/19 11:00 AM  Result Value Ref Range   Order Confirmation      ORDER PROCESSED BY BLOOD BANK Performed at [facility], [address] [place], [place] [address]   Type and screen     Status: None   Collection Time: 02/24/19 11:00 AM  Result Value Ref Range   ABO/RH(D) O POS    Antibody Screen NEG    Sample Expiration      02/27/2019,2359 Performed at [facility], [address], [place], [place] [address]   APTT     Status: None   Collection Time: 02/24/19 11:28 AM  Result Value Ref Range   aPTT 29 24 - 36 seconds    Comment: Performed at [facility], [address] [place], [place] [address]  CBC     Status: Abnormal   Collection Time: 02/24/19 11:28 AM  Result Value Ref Range  WBC 12.4 (H) 4.0 - 10.5 K/uL   RBC 5.18 (H) 3.87 - 5.11 MIL/uL   Hemoglobin 15.6 (H) 12.0 - 15.0 g/dL   HCT 73.2 (H) 20.2 - 54.2 %   MCV 90.5 80.0 - 100.0 fL   MCH 30.1 26.0 - 34.0 pg   MCHC 33.3 30.0 - 36.0 g/dL   RDW 70.6 23.7 - 62.8 %   Platelets 293 150 - 400 K/uL   nRBC 0.0 0.0 - 0.2 %    Comment: Performed at [facility], [address] [place], [place] [address]  Protime-INR     Status: None   Collection Time: 02/24/19 11:28 AM  Result Value Ref Range   Prothrombin Time 12.8 11.4 - 15.2 seconds   INR 1.0 0.8 - 1.2    Comment: (NOTE) INR goal varies based on device and disease states. Performed at [facility], [address] [place], [place] [address]      Imaging Studies: US OB Transvaginal  Result Date: 02/22/2019 CLINICAL DATA:  Vaginal bleeding and first-trimester pregnancy EXAM: TRANSVAGINAL OB ULTRASOUND TECHNIQUE: Transvaginal ultrasound was performed for complete evaluation of the gestation as well as the maternal uterus, adnexal regions, and pelvic cul-de-sac. COMPARISON:  02/18/2019 FINDINGS: Intrauterine gestational sac: Single Yolk sac:  Visualized. Embryo:  Visualized. Cardiac  Activity: Not Visualized. CRL:   10.3 mm   7 w 1 d Subchorionic hemorrhage:  None visualized. Maternal uterus/adnexae: Retroflexed uterus. Corpus luteum noted on the right. No complex or concerning pelvic fluid. IMPRESSION: Failed pregnancy; cardiac activity is no longer visualized. No subchorionic hemorrhage noted today. Electronically Signed   By: Marnee Spring M.D.   On: 02/22/2019 04:43   US OB LESS THAN 14 WEEKS WITH OB TRANSVAGINAL  Result Date: 02/18/2019 CLINICAL DATA:  Pelvic pain and pressure. First trimester pregnancy. EXAM: OBSTETRIC <14 WK [place] AND TRANSVAGINAL OB US TECHNIQUE: Both transabdominal and transvaginal ultrasound examinations were performed for complete evaluation of the gestation as well as the maternal uterus, adnexal regions, and pelvic cul-de-sac. Transvaginal technique was performed to assess early pregnancy. COMPARISON:  None. FINDINGS: Intrauterine gestational sac: Single Yolk sac:  Visualized. Embryo:  Visualized. Cardiac Activity: Visualized. Heart Rate: 123 bpm CRL:  8 mm   6 w   5 d                  [place] EDC: 10/09/2019 Subchorionic hemorrhage:  Small subchorionic hemorrhage. Maternal uterus/adnexae: Retroflexed uterus. Normal appearance of both ovaries. Small right ovarian corpus luteum cyst noted. No mass or abnormal free fluid identified. IMPRESSION: Single living IUP with estimated gestational age of [redacted] weeks 5 days, and [place] EDC of 10/09/2019. Small subchorionic hemorrhage noted. Retroflexed uterus. Electronically Signed   By: Danae Orleans M.D.   On: 02/18/2019 17:33    Assessment: Principal Problem:   Intrauterine fetal demise around seven weeks gestation Active Problems:   Von Willebrand disease, type 1a (HCC)   History of recurrent miscarriages   Plan: Patient will undergo surgical management with Dilation and Evacuation/Suction Dilation and Curettage under ultrasound guidance.   The risks of surgery were discussed in detail with the patient including but not  limited to: bleeding, infection, injury to surrounding organs, need for additional procedures, possibility of intrauterine scarring which may impair future fertility, risk of retained products which may require further management and other postoperative/anesthesia complications were explained to patient.  Likelihood of success in alleviating the patient's condition was discussed. .  Given her history of  VWD, I contacted Dr Marin Olp (Hematology) who recommended preoperative Humate-P. This was ordered with the help of [place], [facility]; patient to get 6920 units IV one hour before start of case.  Patient is also typed and crossmatched for two units of pRBCs and one pool of cryoprecipitate. The patient concurred with the proposed plan, giving informed written consent for the surgery.  Routine postoperative instructions will be reviewed with the patient in detail after surgery.  Patient has been NPO since last night and she will remain NPO for procedure.  Anesthesia and OR aware.  Preoperative prophylactic antibiotics and SCDs ordered on call to the OR.  To OR when ready.    Verita Schneiders, MD, [place] for [employer], [place]

## 2019-02-24 NOTE — Op Note (Signed)
Taylor Flowers PROCEDURE DATE:  02/24/2019  PREOPERATIVE DIAGNOSIS: Intrauterine fetal demise at [redacted]w[redacted]d; von Willebrand's disease POSTOPERATIVE DIAGNOSIS: The same PROCEDURE:  Dilation and Evacuation under ultrasound guidance SURGEON:  Dr. Jaynie Collins  INDICATIONS: 29 y.o.  [id number] with intrauterine fetal demise at [redacted]w[redacted]d, who desires surgical management.  Risks of surgery were discussed with the patient including but not limited to: bleeding which may require transfusion; infection which may require antibiotics; injury to uterus or surrounding organs; need for additional procedures including laparotomy or laparoscopy; possibility of intrauterine scarring which may impair future fertility; and other postoperative/anesthesia complications. Written informed consent was obtained.  FINDINGS:   Intrauterine fetal demise at [redacted]w[redacted]d. Small amount of products of conception.  Empty endometrial stripe noted on ultrasound at the end of the procedure. Of note, patient received Humate-P prior to procedure given history of VWD.   ANESTHESIA:    General, paracervical block with 30 ml of 0.5% Marcaine ESTIMATED BLOOD LOSS: 100 ml. SPECIMENS:  Products of conception sent to pathology COMPLICATIONS:  None immediate.  PROCEDURE DETAILS:  The patient received intravenous Doxycycline while in the preoperative area.  She was then taken to the operating room where anesthesia  was administered and was found to be adequate.  After an adequate timeout was performed, she was placed in the dorsal lithotomy position and examined; then prepped and draped in the sterile manner.   Her bladder was catheterized for an unmeasured amount of clear, yellow urine. A vaginal speculum was then placed in the patient's vagina and a single tooth tenaculum was applied to the anterior lip of the cervix.  A paracervical block using 30 ml of 0.5% Marcaine was administered. The cervix was gently dilated under ultrasound guidance to accommodate a 7 mm  suction curette that was gently advanced to the uterine fundus.  The suction device was then activated and curette slowly rotated to clear the uterus of products of conception.  A sharp curettage was then performed to confirm complete emptying of the uterus. There was an empty endometrial stripe noted on the ultrasound at the end of the curettage. . There was minimal bleeding noted at the end of the procedure, and the tenaculum removed with good hemostasis noted.   All instruments were removed from the patient's vagina.  Sponge and instrument counts were correct times three.   The patient tolerated the procedure well and was taken to the recovery area awake, extubated and in stable condition.  The patient will be discharged to home as per PACU criteria.  Routine postoperative instructions given.  She was prescribed Oxycodone, Ibuprofen and Colace.  She will follow up in the office in 2-3 weeks for postoperative evaluation.   Jaynie Collins, MD, FACOG Obstetrician & Gynecologist, [facility] for [employer], [facility]

## 2019-02-24 NOTE — Transfer of Care (Signed)
Immediate Anesthesia Transfer of Care Note  Patient: Taylor Flowers  Procedure(s) Performed: DILATATION AND EVACUATION (N/A Vagina )  Patient Location: PACU  Anesthesia Type:General  Level of Consciousness: awake, alert  and oriented  Airway & Oxygen Therapy: Patient Spontanous Breathing  Post-op Assessment: Report given to RN, Post -op Vital signs reviewed and stable and Patient moving all extremities X 4  Post vital signs: Reviewed and stable  Last Vitals:  Vitals Value Taken Time  BP 130/83 02/24/19 1347  Temp 36.1 C 02/24/19 1347  Pulse 94 02/24/19 1348  Resp 19 02/24/19 1348  SpO2 100 % 02/24/19 1348  Vitals shown include unvalidated device data.  Last Pain:  Vitals:   02/24/19 1111  TempSrc:   PainSc: 0-No pain      Patients Stated Pain Goal: 4 (02/24/19 1111)  Complications: No apparent anesthesia complications

## 2019-02-25 LAB — TYPE AND SCREEN
ABO/RH(D): O POS
Antibody Screen: NEGATIVE

## 2019-02-27 ENCOUNTER — Encounter: Payer: Self-pay | Admitting: *Deleted

## 2019-02-27 LAB — SURGICAL PATHOLOGY

## 2019-03-06 ENCOUNTER — Ambulatory Visit: Payer: Self-pay | Admitting: Family Medicine

## 2019-03-15 ENCOUNTER — Telehealth (INDEPENDENT_AMBULATORY_CARE_PROVIDER_SITE_OTHER): Payer: Self-pay | Admitting: Obstetrics & Gynecology

## 2019-03-15 ENCOUNTER — Encounter: Payer: Self-pay | Admitting: Obstetrics & Gynecology

## 2019-03-15 DIAGNOSIS — Z48816 Encounter for surgical aftercare following surgery on the genitourinary system: Secondary | ICD-10-CM

## 2019-03-15 DIAGNOSIS — Z09 Encounter for follow-up examination after completed treatment for conditions other than malignant neoplasm: Secondary | ICD-10-CM

## 2019-03-15 NOTE — Patient Instructions (Addendum)
Return to clinic for any scheduled appointments or for any gynecologic concerns as needed.    Preventive Care 89-30 Years Old, Female Preventive care refers to visits with your health care provider and lifestyle choices that can promote health and wellness. This includes:  A yearly physical exam. This may also be called an annual well check.  Regular dental visits and eye exams.  Immunizations.  Screening for certain conditions.  Healthy lifestyle choices, such as eating a healthy diet, getting regular exercise, not using drugs or products that contain nicotine and tobacco, and limiting alcohol use. What can I expect for my preventive care visit? Physical exam Your health care provider will check your:  Height and weight. This may be used to calculate body mass index (BMI), which tells if you are at a healthy weight.  Heart rate and blood pressure.  Skin for abnormal spots. Counseling Your health care provider may ask you questions about your:  Alcohol, tobacco, and drug use.  Emotional well-being.  Home and relationship well-being.  Sexual activity.  Eating habits.  Work and work Statistician.  Method of birth control.  Menstrual cycle.  Pregnancy history. What immunizations do I need?  Influenza (flu) vaccine  This is recommended every year. Tetanus, diphtheria, and pertussis (Tdap) vaccine  You may need a Td booster every 10 years. Varicella (chickenpox) vaccine  You may need this if you have not been vaccinated. Human papillomavirus (HPV) vaccine  If recommended by your health care provider, you may need three doses over 6 months. Measles, mumps, and rubella (MMR) vaccine  You may need at least one dose of MMR. You may also need a second dose. Meningococcal conjugate (MenACWY) vaccine  One dose is recommended if you are age 30-21 years and a first-year college student living in a residence hall, or if you have one of several medical conditions. You  may also need additional booster doses. Pneumococcal conjugate (PCV13) vaccine  You may need this if you have certain conditions and were not previously vaccinated. Pneumococcal polysaccharide (PPSV23) vaccine  You may need one or two doses if you smoke cigarettes or if you have certain conditions. Hepatitis A vaccine  You may need this if you have certain conditions or if you travel or work in places where you may be exposed to hepatitis A. Hepatitis B vaccine  You may need this if you have certain conditions or if you travel or work in places where you may be exposed to hepatitis B. Haemophilus influenzae type b (Hib) vaccine  You may need this if you have certain conditions. You may receive vaccines as individual doses or as more than one vaccine together in one shot (combination vaccines). Talk with your health care provider about the risks and benefits of combination vaccines. What tests do I need?  Blood tests  Lipid and cholesterol levels. These may be checked every 5 years starting at age 30.  Hepatitis C test.  Hepatitis B test. Screening  Diabetes screening. This is done by checking your blood sugar (glucose) after you have not eaten for a while (fasting).  Sexually transmitted disease (STD) testing.  BRCA-related cancer screening. This may be done if you have a family history of breast, ovarian, tubal, or peritoneal cancers.  Pelvic exam and Pap test. This may be done every 3 years starting at age 30. Starting at age 30, this may be done every 5 years if you have a Pap test in combination with an HPV test. Talk with your  health care provider about your test results, treatment options, and if necessary, the need for more tests. Follow these instructions at home: Eating and drinking   Eat a diet that includes fresh fruits and vegetables, whole grains, lean protein, and low-fat dairy.  Take vitamin and mineral supplements as recommended by your health care  provider.  Do not drink alcohol if: ? Your health care provider tells you not to drink. ? You are pregnant, may be pregnant, or are planning to become pregnant.  If you drink alcohol: ? Limit how much you have to 0-1 drink a day. ? Be aware of how much alcohol is in your drink. In the [place] one drink equals one 12 oz bottle of beer (355 mL), one 5 oz glass of wine (148 mL), or one 1 oz glass of hard liquor (44 mL). Lifestyle  Take daily care of your teeth and gums.  Stay active. Exercise for at least 30 minutes on 5 or more days each week.  Do not use any products that contain nicotine or tobacco, such as cigarettes, e-cigarettes, and chewing tobacco. If you need help quitting, ask your health care provider.  If you are sexually active, practice safe sex. Use a condom or other form of birth control (contraception) in order to prevent pregnancy and STIs (sexually transmitted infections). If you plan to become pregnant, see your health care provider for a preconception visit. What's next?  Visit your health care provider once a year for a well check visit.  Ask your health care provider how often you should have your eyes and teeth checked.  Stay up to date on all vaccines. This information is not intended to replace advice given to you by your health care provider. Make sure you discuss any questions you have with your health care provider. Document Revised: 09/16/2017 Document Reviewed: 09/16/2017 Elsevier Patient Education  2020 [employer].

## 2019-03-15 NOTE — Progress Notes (Signed)
TELEHEALTH GYNECOLOGY VIRTUAL VIDEO POSTOPERATIVE VISIT NOTE  Provider location: [facility] [place]   I connected with Taylor Flowers on 03/15/19 at  1:15 PM EST by MyChart Video Encounter at home and verified that I am speaking with the correct person using two identifiers.   I discussed the limitations, risks, security and privacy concerns of performing an evaluation and management service virtually and the availability of in person appointments. I also discussed with the patient that there may be a patient responsible charge related to this service. The patient expressed understanding and agreed to proceed.   History:  Taylor Flowers is a 29 y.o. [id number] female being followed up today for D&E for missed abortion at [redacted] week gestation.  She denies any abnormal vaginal discharge, bleeding, pelvic pain or other concerns.   Doing well from a mental health standpoint.    Past Medical History:  Diagnosis Date  . Anemia   . Anxiety    no meds  . Depression    no meds  . History of MRSA infection 2012   buttock  . Hypertension   . 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 Iron deficiency anemia due to chronic blood loss 02/11/2015  . Menometrorrhagia 02/11/2015  . Metacarpal bone fracture 12/2013   left small  . Migraines    "a few times/yr" (01/07/2015)  . Miscarriage   . Post-tonsillectomy hemorrhage 01/07/2015  . Premature rupture of membranes in pregnancy, antepartum-at 17 wks 01/21/2013  . PTSD (post-traumatic stress disorder)    no meds  . Short cervix with cervical cerclage in second trimester, antepartum 08/13/2013  . Spinal headache   . Spontaneous abortion in second trimester 08/23/2013  . Von Willebrand disease ([place])   . Von Willebrand disease, type 1a ([place]) 02/11/2015   Past Surgical History:  Procedure Laterality Date  . CERVICAL CERCLAGE N/A 06/13/2013   Procedure: CERCLAGE CERVICAL;  Surgeon: Frederico Hamman, MD;  Location: [facility];  Service: Gynecology;   Laterality: N/A;  . CLOSED REDUCTION METACARPAL WITH PERCUTANEOUS PINNING Left 12/25/2013   Procedure: LEFT SMALL METACARPAL CLOSED REDUCTION PERCUTANEOUS PINNING ;  Surgeon: Leanora Cover, MD;  Location: [facility];  Service: Orthopedics;  Laterality: Left;  . DILATION AND CURETTAGE OF UTERUS N/A 08/23/2013   Procedure: Dilatation and Currettage with Ultrasound Guidance;  Surgeon: Frederico Hamman, MD;  Location: [facility];  Service: Gynecology;  Laterality: N/A;  . DILATION AND CURETTAGE OF UTERUS  06/2007  . DILATION AND EVACUATION N/A 01/24/2013   Procedure: DILATATION AND EVACUATION;  Surgeon: Mora Bellman, MD;  Location: [facility];  Service: Gynecology;  Laterality: N/A;  . DILATION AND EVACUATION N/A 09/11/2014   Procedure: DILATATION AND EVACUATION;  Surgeon: Cheri Fowler, MD;  Location: [facility];  Service: Gynecology;  Laterality: N/A;  . DILATION AND EVACUATION N/A 02/24/2019   Procedure: DILATATION AND EVACUATION;  Surgeon: Osborne Oman, MD;  Location: [facility];  Service: Gynecology;  Laterality: N/A;  . FRACTURE SURGERY    . TONSILLECTOMY N/A 12/30/2014   Procedure: CONTROL OF POST TONSILLAR BLEED;  Surgeon: Jerrell Belfast, MD;  Location: [facility];  Service: ENT;  Laterality: N/A;  . TONSILLECTOMY  01/07/2015   control of post tonsillar bleed  . TONSILLECTOMY N/A 01/07/2015   Procedure: CONTROL TONSIL BLEED;  Surgeon: Ruby Cola, MD;  Location: [facility];  Service: ENT;  Laterality: N/A;  . TONSILLECTOMY AND ADENOIDECTOMY N/A 12/20/2014   Procedure: CONTROL TONSIL  BLEED;  Surgeon:  Flo Shanks, MD;  Location: [facility];  Service: ENT;  Laterality: N/A;   The following portions of the patient's history were reviewed and updated as appropriate: allergies, current medications, past family history, past medical history, past social history, past surgical history and problem list.   Health Maintenance:  Normal pap over three years ago.  Review of Systems:  Pertinent items noted in HPI  and remainder of comprehensive ROS otherwise negative.  Physical Exam:   General:  Alert, oriented and cooperative. Patient appears to be in no acute distress.  Mental Status: Normal mood and affect. Normal behavior. Normal judgment and thought content.   Respiratory: Normal respiratory effort, no problems with respiration noted  Rest of physical exam deferred due to type of encounter  Labs and Imaging 02/24/2019 Pathology  A. PRODUCTS OF CONCEPTION, EVACUATION:  - Decidualized endometrium and chorionic villi      Assessment and Plan:     1. Postop check Doing well Pathology reviewed Pain and bleeding are stable, no current concerns Needs pap smear, will be scheduled for annual exam soon.     I discussed the assessment and treatment plan with the patient. The patient was provided an opportunity to ask questions and all were answered. The patient agreed with the plan and demonstrated an understanding of the instructions.   The patient was advised to call back or seek an in-person evaluation/go to the ED if the symptoms worsen or if the condition fails to improve as anticipated.  I provided 10 minutes of face-to-face time during this encounter.   Jaynie Collins, MD [facility] [employer], [facility]

## 2019-03-21 NOTE — Anesthesia Postprocedure Evaluation (Signed)
Anesthesia Post Note  Patient: Taylor Flowers  Procedure(s) Performed: DILATATION AND EVACUATION (N/A Vagina )     Patient location during evaluation: PACU Anesthesia Type: General Level of consciousness: sedated Pain management: pain level controlled Vital Signs Assessment: post-procedure vital signs reviewed and stable Respiratory status: spontaneous breathing Cardiovascular status: stable Postop Assessment: no apparent nausea or vomiting Anesthetic complications: no    Last Vitals:  Vitals:   02/24/19 1405 02/24/19 1420  BP: 120/75 124/82  Pulse: 86 76  Resp: 20 20  Temp:    SpO2: 100% 100%    Last Pain:  Vitals:   02/24/19 1420  TempSrc:   PainSc: 4    Pain Goal: Patients Stated Pain Goal: 4 (02/24/19 1111)                 Caren Macadam

## 2019-03-27 ENCOUNTER — Encounter: Payer: Self-pay | Admitting: Obstetrics & Gynecology

## 2019-03-29 ENCOUNTER — Ambulatory Visit: Payer: Self-pay | Admitting: Obstetrics & Gynecology

## 2019-03-29 NOTE — Progress Notes (Deleted)
   Patient did not show up today for her scheduled appointment.   Adilson Grafton, MD, FACOG Obstetrician & Gynecologist, [facility], [facility]  

## 2019-08-26 ENCOUNTER — Emergency Department (HOSPITAL_COMMUNITY)
Admission: EM | Admit: 2019-08-26 | Discharge: 2019-08-26 | Disposition: A | Discharge status: Home / Self Care | Payer: Medicaid Other | Attending: Emergency Medicine | Admitting: Emergency Medicine

## 2019-08-26 ENCOUNTER — Other Ambulatory Visit: Payer: Self-pay

## 2019-08-26 ENCOUNTER — Emergency Department (HOSPITAL_COMMUNITY): Payer: Medicaid Other

## 2019-08-26 ENCOUNTER — Inpatient Hospital Stay (HOSPITAL_COMMUNITY)
Admission: AD | Admit: 2019-08-26 | Discharge: 2019-08-26 | Disposition: A | Discharge status: Home / Self Care | Payer: Self-pay | Attending: Obstetrics & Gynecology | Admitting: Obstetrics & Gynecology

## 2019-08-26 ENCOUNTER — Encounter (HOSPITAL_COMMUNITY): Payer: Self-pay

## 2019-08-26 DIAGNOSIS — I10 Essential (primary) hypertension: Secondary | ICD-10-CM | POA: Insufficient documentation

## 2019-08-26 DIAGNOSIS — F17210 Nicotine dependence, cigarettes, uncomplicated: Secondary | ICD-10-CM | POA: Insufficient documentation

## 2019-08-26 DIAGNOSIS — N939 Abnormal uterine and vaginal bleeding, unspecified: Secondary | ICD-10-CM | POA: Insufficient documentation

## 2019-08-26 DIAGNOSIS — Z3202 Encounter for pregnancy test, result negative: Secondary | ICD-10-CM

## 2019-08-26 LAB — CBC WITH DIFFERENTIAL/PLATELET
Abs Immature Granulocytes: 0.01 10*3/uL (ref 0.00–0.07)
Basophils Absolute: 0 10*3/uL (ref 0.0–0.1)
Basophils Relative: 1 %
Eosinophils Absolute: 0.2 10*3/uL (ref 0.0–0.5)
Eosinophils Relative: 3 %
HCT: 46.7 % — ABNORMAL HIGH (ref 36.0–46.0)
Hemoglobin: 15.3 g/dL — ABNORMAL HIGH (ref 12.0–15.0)
Immature Granulocytes: 0 %
Lymphocytes Relative: 25 %
Lymphs Abs: 1.6 10*3/uL (ref 0.7–4.0)
MCH: 29.7 pg (ref 26.0–34.0)
MCHC: 32.8 g/dL (ref 30.0–36.0)
MCV: 90.7 fL (ref 80.0–100.0)
Monocytes Absolute: 0.4 10*3/uL (ref 0.1–1.0)
Monocytes Relative: 6 %
Neutro Abs: 4.1 10*3/uL (ref 1.7–7.7)
Neutrophils Relative %: 65 %
Platelets: 284 10*3/uL (ref 150–400)
RBC: 5.15 MIL/uL — ABNORMAL HIGH (ref 3.87–5.11)
RDW: 11.9 % (ref 11.5–15.5)
WBC: 6.3 10*3/uL (ref 4.0–10.5)
nRBC: 0 % (ref 0.0–0.2)

## 2019-08-26 LAB — WET PREP, GENITAL
Clue Cells Wet Prep HPF POC: NONE SEEN
Sperm: NONE SEEN
Trich, Wet Prep: NONE SEEN
Yeast Wet Prep HPF POC: NONE SEEN

## 2019-08-26 LAB — BASIC METABOLIC PANEL
Anion gap: 10 (ref 5–15)
BUN: 10 mg/dL (ref 6–20)
CO2: 27 mmol/L (ref 22–32)
Calcium: 9.3 mg/dL (ref 8.9–10.3)
Chloride: 103 mmol/L (ref 98–111)
Creatinine, Ser: 0.93 mg/dL (ref 0.44–1.00)
GFR calc Af Amer: >60 mL/min (ref >60)
GFR calc non Af Amer: >60 mL/min (ref >60)
Glucose, Bld: 104 mg/dL — ABNORMAL HIGH (ref 70–99)
Potassium: 4.3 mmol/L (ref 3.5–5.1)
Sodium: 140 mmol/L (ref 135–145)

## 2019-08-26 LAB — I-STAT BETA HCG BLOOD, ED (MC, WL, AP ONLY): I-stat hCG, quantitative: <5 m[IU]/mL (ref <5)

## 2019-08-26 LAB — POCT PREGNANCY, URINE: Preg Test, Ur: NEGATIVE

## 2019-08-26 NOTE — Discharge Instructions (Signed)
[facility] Prenatal Care Providers °         °[facility] locations:  °Hours may vary. Please call for an appointment ° °[facility] [place] ° [address] [place]  °[phone] ° °[facility]  ° [address]  °[phone] ° °[facility] [place]      ° [address] °[phone]   °         °[facility] [place]    ° [address] °[phone] °         °[facility] [place]  ° [address] °[phone] ° °[facility] ° [address] °[phone] °    °[facility]) ° [address]  ° [phone] ° °

## 2019-08-26 NOTE — MAU Note (Signed)
[name] is a 29 y.o. here in MAU reporting: states she has been bleeding for a month. Had a normal period and then had some lighter bleeding. For the past 4 days has been bleeding heavier, similar to a normal period. States is having pain but it feels like surgery pain. States bleeding does not smell like normal iron. Does not think she is pregnant.   LMP: 07/23/19  Onset of complaint: ongoing  Pain score: 7/10  Vitals:   08/26/19 1250  BP: 135/90  Pulse: 86  Resp: 16  Temp: 98.3 F (36.8 C)  SpO2: 96%     Lab orders placed from triage: UPT

## 2019-08-26 NOTE — Discharge Instructions (Addendum)
You were evaluated in the Emergency Department and after careful evaluation, we did not find any emergent condition requiring admission or further testing in the hospital.  Your exam/testing today was overall reassuring.  Please follow-up with the specialists provided.  Please return to the Emergency Department if you experience any worsening of your condition.  Thank you for allowing [place] to be a part of your care.

## 2019-08-26 NOTE — ED Triage Notes (Signed)
Pt presents with multiple complaints reports vaginal bleeding x1 month, sent here from [facility] pt is not pregnant, heavy in the beginning, then spotting and now having an "off" odor associated with heavy bleeding. Pt states she had a miscarriage in February, had a surgery 2 weeks later. States it feels like she is having surgical pain.  Pt also reports right "broken thumb" states she has all the symptoms from having a broken bone; headache, weakness and is unable to work.

## 2019-08-26 NOTE — ED Provider Notes (Signed)
[facility] Emergency Department Provider Note MRN:  [id number]  Arrival date & time: 08/26/19     Chief Complaint   Vaginal Bleeding   History of Present Illness   Taylor Flowers is a 29 y.o. year-old female with a history of hypertension, von Willebrand disease presenting to the ED with chief complaint of vaginal bleeding.  1 month of vaginal bleeding, multiple years of left hand pain, miscarriage and D&C procedure back in February.  Complaining of multiple complaints today, headache, weakness, unable to work because of the hand pain.  Symptoms are constant, mild to moderate, no exacerbating relieving factors.  Denies fever, no cough, no chest pain, shortness of breath, no abdominal pain but does endorse intermittent lower pelvic pain.  Review of Systems  A complete 10 system review of systems was obtained and all systems are negative except as noted in the HPI and PMH.   Patient's Health History    Past Medical History:  Diagnosis Date  . Anemia   . Anxiety    no meds  . Depression    no meds  . History of MRSA infection 2012   buttock  . Hypertension   . 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 Iron deficiency anemia due to chronic blood loss 02/11/2015  . Menometrorrhagia 02/11/2015  . Metacarpal bone fracture 12/2013   left small  . Migraines    "a few times/yr" (01/07/2015)  . Miscarriage   . Post-tonsillectomy hemorrhage 01/07/2015  . Premature rupture of membranes in pregnancy, antepartum-at 17 wks 01/21/2013  . PTSD (post-traumatic stress disorder)    no meds  . Short cervix with cervical cerclage in second trimester, antepartum 08/13/2013  . Spinal headache   . Spontaneous abortion in second trimester 08/23/2013  . Von Willebrand disease ([place])   . Von Willebrand disease, type 1a ([place]) 02/11/2015    Past Surgical History:  Procedure Laterality Date  . CERVICAL CERCLAGE N/A 06/13/2013   Procedure: CERCLAGE CERVICAL;  Surgeon: Frederico Hamman,  MD;  Location: [facility];  Service: Gynecology;  Laterality: N/A;  . CLOSED REDUCTION METACARPAL WITH PERCUTANEOUS PINNING Left 12/25/2013   Procedure: LEFT SMALL METACARPAL CLOSED REDUCTION PERCUTANEOUS PINNING ;  Surgeon: Leanora Cover, MD;  Location: [facility];  Service: Orthopedics;  Laterality: Left;  . DILATION AND CURETTAGE OF UTERUS N/A 08/23/2013   Procedure: Dilatation and Currettage with Ultrasound Guidance;  Surgeon: Frederico Hamman, MD;  Location: [facility];  Service: Gynecology;  Laterality: N/A;  . DILATION AND CURETTAGE OF UTERUS  06/2007  . DILATION AND EVACUATION N/A 01/24/2013   Procedure: DILATATION AND EVACUATION;  Surgeon: Mora Bellman, MD;  Location: [facility];  Service: Gynecology;  Laterality: N/A;  . DILATION AND EVACUATION N/A 09/11/2014   Procedure: DILATATION AND EVACUATION;  Surgeon: Cheri Fowler, MD;  Location: [facility];  Service: Gynecology;  Laterality: N/A;  . DILATION AND EVACUATION N/A 02/24/2019   Procedure: DILATATION AND EVACUATION;  Surgeon: Osborne Oman, MD;  Location: [facility];  Service: Gynecology;  Laterality: N/A;  . FRACTURE SURGERY    . TONSILLECTOMY N/A 12/30/2014   Procedure: CONTROL OF POST TONSILLAR BLEED;  Surgeon: Jerrell Belfast, MD;  Location: [facility] OR;  Service: ENT;  Laterality: N/A;  . TONSILLECTOMY  01/07/2015   control of post tonsillar bleed  . TONSILLECTOMY N/A 01/07/2015   Procedure: CONTROL TONSIL BLEED;  Surgeon: Ruby Cola, MD;  Location: [facility];  Service: ENT;  Laterality: N/A;  . TONSILLECTOMY AND ADENOIDECTOMY N/A  12/20/2014   Procedure: CONTROL TONSIL  BLEED;  Surgeon: Jodi Marble, MD;  Location: [facility] OR;  Service: ENT;  Laterality: N/A;    Family History  Problem Relation Age of Onset  . Hypertension Mother   . Hypertension Father   . Diabetes Father   . Vision loss Father     Social History   Socioeconomic History  . Marital status: Single    Spouse name: Not on file  . Number of children: Not on file  . Years  of education: Not on file  . Highest education level: Not on file  Occupational History  . Not on file  Tobacco Use  . Smoking status: Former Smoker    Years: 1.00    Types: Cigars    Quit date: 01/2019    Years since quitting: 0.5  . Smokeless tobacco: Never Used  Vaping Use  . Vaping Use: Never used  Substance and Sexual Activity  . Alcohol use: Not Currently    Alcohol/week: 0.0 standard drinks    Comment:  none since 01/2019 when pt found out she was pregnant  . Drug use: Not Currently    Frequency: 7.0 times per week    Types: Marijuana    Comment: Last use 01/2019 when found out she was pregnant  . Sexual activity: Not Currently    Birth control/protection: None    Comment: approx [redacted] wks gestation  Other Topics Concern  . Not on file  Social History Narrative  . Not on file   Social Determinants of Health   Financial Resource Strain:   . Difficulty of Paying Living Expenses:   Food Insecurity:   . Worried About Charity fundraiser in the Last Year:   . Arboriculturist in the Last Year:   Transportation Needs:   . Film/video editor (Medical):   Marland Kitchen Lack of Transportation (Non-Medical):   Physical Activity:   . Days of Exercise per Week:   . Minutes of Exercise per Session:   Stress:   . Feeling of Stress :   Social Connections:   . Frequency of Communication with Friends and Family:   . Frequency of Social Gatherings with Friends and Family:   . Attends Religious Services:   . Active Member of Clubs or Organizations:   . Attends Archivist Meetings:   Marland Kitchen Marital Status:   Intimate Partner Violence:   . Fear of Current or Ex-Partner:   . Emotionally Abused:   Marland Kitchen Physically Abused:   . Sexually Abused:      Physical Exam   Vitals:   08/26/19 1852 08/26/19 1917  BP: (!) 161/97 (!) 131/96  Pulse: 71 72  Resp: 17 15  Temp:    SpO2: 95% 99%    CONSTITUTIONAL: Well-appearing, NAD NEURO:  Alert and oriented x 3, no focal deficits EYES:  eyes  equal and reactive ENT/NECK:  no LAD, no JVD CARDIO: Regular rate, well-perfused, normal S1 and S2 PULM:  CTAB no wheezing or rhonchi GI/GU:  normal bowel sounds, non-distended, non-tender MSK/SPINE:  No gross deformities, no edema SKIN:  no rash, atraumatic PSYCH:  Appropriate speech and behavior  *Additional and/or pertinent findings included in MDM below  Diagnostic and Interventional Summary    EKG Interpretation  Date/Time:    Ventricular Rate:    PR Interval:    QRS Duration:   QT Interval:    QTC Calculation:   R Axis:     Text Interpretation:  Labs Reviewed  WET PREP, GENITAL - Abnormal; Notable for the following components:      Result Value   WBC, Wet Prep HPF POC FEW (*)    All other components within normal limits  CBC WITH DIFFERENTIAL/PLATELET - Abnormal; Notable for the following components:   RBC 5.15 (*)    Hemoglobin 15.3 (*)    HCT 46.7 (*)    All other components within normal limits  BASIC METABOLIC PANEL - Abnormal; Notable for the following components:   Glucose, Bld 104 (*)    All other components within normal limits  I-STAT BETA HCG BLOOD, ED ([facility], [facility], [facility] ONLY)  GC/CHLAMYDIA PROBE AMP ([place]) NOT AT [facility]    DG Hand Complete Left  Final Result      Medications - No data to display   Procedures  /  Critical Care Procedures  ED Course and Medical Decision Making  I have reviewed the triage vital signs, the nursing notes, and pertinent available records from the EMR.  Listed above are laboratory and imaging tests that I personally ordered, reviewed, and interpreted and then considered in my medical decision making (see below for details).      Myriad of chronic problems with no signs of emergencies today, normal vital signs, benign abdomen.  Reassuring pelvic exam with no cervical motion tenderness, no adnexal masses or tenderness, small amount of blood in the vaginal vault, no signs of PID or infection.  Labs reassuring,  appropriate for discharge with referrals.    Barth Kirks. [place] Small, [place] [email or web address]  Final Clinical Impressions(s) / ED Diagnoses     ICD-10-CM   1. Vaginal bleeding  N93.9     ED Discharge Orders    None       Discharge Instructions Discussed with and Provided to Patient:     Discharge Instructions     You were evaluated in the Emergency Department and after careful evaluation, we did not find any emergent condition requiring admission or further testing in the hospital.  Your exam/testing today was overall reassuring.  Please follow-up with the specialists provided.  Please return to the Emergency Department if you experience any worsening of your condition.  Thank you for allowing [place] to be a part of your care.       Maudie Flakes, MD 08/26/19 [phone]

## 2019-08-26 NOTE — MAU Provider Note (Signed)
S Ms. MARYANA PITTMON is a 29 y.o. [id number] non-pregnant female who presents to MAU today with complaint of vaginal bleeding.   O BP 135/90 (BP Location: Right Arm)   Pulse 86   Temp 98.3 F (36.8 C) (Oral)   Resp 16   Ht 6' (1.829 m)   Wt (!) 144.3 kg   LMP 07/23/2019   SpO2 96% Comment: room air  BMI 43.14 kg/m  Physical Exam Vitals and nursing note reviewed.  Constitutional:      General: She is not in acute distress.    Appearance: She is well-developed.  HENT:     Head: Normocephalic.  Eyes:     Pupils: Pupils are equal, round, and reactive to light.  Cardiovascular:     Rate and Rhythm: Normal rate and regular rhythm.     Heart sounds: Normal heart sounds.  Pulmonary:     Effort: Pulmonary effort is normal. No respiratory distress.     Breath sounds: Normal breath sounds.  Abdominal:     General: Bowel sounds are normal. There is no distension.     Palpations: Abdomen is soft.     Tenderness: There is no abdominal tenderness.  Skin:    General: Skin is warm and dry.  Neurological:     Mental Status: She is alert and oriented to person, place, and time.  Psychiatric:        Behavior: Behavior normal.        Thought Content: Thought content normal.        Judgment: Judgment normal.    A Non pregnant female Medical screening exam complete  P Discharge from MAU in stable condition Patient given the option of transfer to [facility] for further evaluation or seek care in outpatient facility of choice List of options for follow-up given  Warning signs for worsening condition that would warrant emergency follow-up discussed Patient may return to MAU as needed for pregnancy related complaints  Rolm Bookbinder, CNM 08/26/2019 1:16 PM

## 2019-08-28 LAB — GC/CHLAMYDIA PROBE AMP (~~LOC~~) NOT AT ARMC
Chlamydia: NEGATIVE
Comment: NEGATIVE
Comment: NORMAL
Neisseria Gonorrhea: NEGATIVE

## 2019-08-30 ENCOUNTER — Ambulatory Visit (INDEPENDENT_AMBULATORY_CARE_PROVIDER_SITE_OTHER): Payer: Self-pay | Admitting: Orthopedic Surgery

## 2019-08-30 ENCOUNTER — Encounter: Payer: Self-pay | Admitting: Orthopedic Surgery

## 2019-08-30 ENCOUNTER — Ambulatory Visit (INDEPENDENT_AMBULATORY_CARE_PROVIDER_SITE_OTHER): Payer: Self-pay

## 2019-08-30 DIAGNOSIS — M79602 Pain in left arm: Secondary | ICD-10-CM

## 2019-08-30 NOTE — Progress Notes (Signed)
Office Visit Note   Patient: Taylor Flowers           Date of Birth: 07-25-1990           MRN: [id number] Visit Date: 08/30/2019 Requested by: Verlon Au, MD [address] [place],  [place] [address] PCP: Verlon Au, MD  Subjective: Chief Complaint  Patient presents with  . Left Hand - Pain    HPI: Taylor Flowers is a 29 year old patient with left hand pain.  Had a left hand injury in 2015 which was 1/5 metacarpal fracture.  Treated by hand surgeon with pins.  Reinjured in 2017 helping someone move.  Hurts in the palmar aspect of the left hand radiating up to the shoulder.  Denies any neck pain.  Patient is a Physicist, medical.  Does report some numbness and tingling in the hand.  Patient reports that the arm feels heavy like it is dead weight.              ROS: All systems reviewed are negative as they relate to the chief complaint within the history of present illness.  Patient denies  fevers or chills.   Assessment & Plan: Visit Diagnoses:  1. Left arm pain     Plan: Impression is atypical left arm and hand pain.  Fracture is healed radiographically on old x-rays which were reviewed.  There is only mild deformity at the fracture site with no rotational deformity.  Having some pain in the wrist as well.  Plan at this time is MRI of the left wrist to include the base of the fifth metacarpal to evaluate for fracture anomaly versus wrist pathology which could be giving some occult nerve compression.  Also need nerve conduction to evaluate for possible carpal tunnel in that left hand.  Follow-up after the studies.  Follow-Up Instructions: Return for after MRI.   Orders:  Orders Placed This Encounter  Procedures  . XR Cervical Spine 2 or 3 views  . MR Wrist Left w/o contrast  . Ambulatory referral to Physical Medicine Rehab   No orders of the defined types were placed in this encounter.     Procedures: No procedures performed   Clinical Data: No  additional findings.  Objective: Vital Signs: There were no vitals taken for this visit.  Physical Exam:   Constitutional: Patient appears well-developed HEENT:  Head: Normocephalic Eyes:EOM are normal Neck: Normal range of motion Cardiovascular: Normal rate Pulmonary/chest: Effort normal Neurologic: Patient is alert Skin: Skin is warm Psychiatric: Patient has normal mood and affect    Ortho Exam: Ortho exam demonstrates good grip strength bilaterally.  There is some tenderness at the base of the fifth metacarpal but no real rotational deformity with clenched fist.  Radial ulnar deviation intact with no clunking in the TFCC region.  EPL FPL interosseous strength 5+ out of 5.  Radial pulse intact.  No masses lymphadenopathy or skin changes noted in that wrist region.  Shoulder elbow range of motion full and normal.  No muscle atrophy in that left arm.  Neck range of motion full.  Specialty Comments:  No specialty comments available.  Imaging: XR Cervical Spine 2 or 3 views  Result Date: 08/30/2019 AP lateral cervical spine reviewed.  Patient has mild loss of lordosis.  No significant degenerative disc disease.  Minimal facet arthritis.  No compression fractures.    PMFS History: Patient Active Problem List   Diagnosis Date Noted  . History of recurrent miscarriages  02/24/2019  . Von Willebrand disease, type 1a (HCC) 02/11/2015  . Iron deficiency anemia due to chronic blood loss 02/11/2015  . Menometrorrhagia 02/11/2015  . Post-tonsillectomy hemorrhage 01/07/2015   Past Medical History:  Diagnosis Date  . Anemia   . Anxiety    no meds  . Depression    no meds  . History of MRSA infection 2012   buttock  . Hypertension   . 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 Iron deficiency anemia due to chronic blood loss 02/11/2015  . Menometrorrhagia 02/11/2015  . Metacarpal bone fracture 12/2013   left small  . Migraines    "a few times/yr" (01/07/2015)  . Miscarriage   .  Post-tonsillectomy hemorrhage 01/07/2015  . Premature rupture of membranes in pregnancy, antepartum-at 17 wks 01/21/2013  . PTSD (post-traumatic stress disorder)    no meds  . Short cervix with cervical cerclage in second trimester, antepartum 08/13/2013  . Spinal headache   . Spontaneous abortion in second trimester 08/23/2013  . Von Willebrand disease (HCC)   . Von Willebrand disease, type 1a (HCC) 02/11/2015    Family History  Problem Relation Age of Onset  . Hypertension Mother   . Hypertension Father   . Diabetes Father   . Vision loss Father     Past Surgical History:  Procedure Laterality Date  . CERVICAL CERCLAGE N/A 06/13/2013   Procedure: CERCLAGE CERVICAL;  Surgeon: Kathreen Cosier, MD;  Location: [facility];  Service: Gynecology;  Laterality: N/A;  . CLOSED REDUCTION METACARPAL WITH PERCUTANEOUS PINNING Left 12/25/2013   Procedure: LEFT SMALL METACARPAL CLOSED REDUCTION PERCUTANEOUS PINNING ;  Surgeon: Betha Loa, MD;  Location: [facility];  Service: Orthopedics;  Laterality: Left;  . DILATION AND CURETTAGE OF UTERUS N/A 08/23/2013   Procedure: Dilatation and Currettage with Ultrasound Guidance;  Surgeon: Kathreen Cosier, MD;  Location: [facility];  Service: Gynecology;  Laterality: N/A;  . DILATION AND CURETTAGE OF UTERUS  06/2007  . DILATION AND EVACUATION N/A 01/24/2013   Procedure: DILATATION AND EVACUATION;  Surgeon: Catalina Antigua, MD;  Location: [facility];  Service: Gynecology;  Laterality: N/A;  . DILATION AND EVACUATION N/A 09/11/2014   Procedure: DILATATION AND EVACUATION;  Surgeon: Lavina Hamman, MD;  Location: [facility];  Service: Gynecology;  Laterality: N/A;  . DILATION AND EVACUATION N/A 02/24/2019   Procedure: DILATATION AND EVACUATION;  Surgeon: Tereso Newcomer, MD;  Location: [facility];  Service: Gynecology;  Laterality: N/A;  . FRACTURE SURGERY    . TONSILLECTOMY N/A 12/30/2014   Procedure: CONTROL OF POST TONSILLAR BLEED;  Surgeon: Osborn Coho, MD;   Location: [facility];  Service: ENT;  Laterality: N/A;  . TONSILLECTOMY  01/07/2015   control of post tonsillar bleed  . TONSILLECTOMY N/A 01/07/2015   Procedure: CONTROL TONSIL BLEED;  Surgeon: Melvenia Beam, MD;  Location: [facility] OR;  Service: ENT;  Laterality: N/A;  . TONSILLECTOMY AND ADENOIDECTOMY N/A 12/20/2014   Procedure: CONTROL TONSIL  BLEED;  Surgeon: Flo Shanks, MD;  Location: [facility] OR;  Service: ENT;  Laterality: N/A;   Social History   Occupational History  . Not on file  Tobacco Use  . Smoking status: Former Smoker    Years: 1.00    Types: Cigars    Quit date: 01/2019    Years since quitting: 0.6  . Smokeless tobacco: Never Used  Vaping Use  . Vaping Use: Never used  Substance and Sexual Activity  . Alcohol use: Not Currently    Alcohol/week: 0.0  standard drinks    Comment:  none since 01/2019 when pt found out she was pregnant  . Drug use: Not Currently    Frequency: 7.0 times per week    Types: Marijuana    Comment: Last use 01/2019 when found out she was pregnant  . Sexual activity: Not Currently    Birth control/protection: None    Comment: approx [redacted] wks gestation

## 2019-09-17 ENCOUNTER — Encounter: Payer: Self-pay | Admitting: Orthopedic Surgery

## 2019-09-18 NOTE — Telephone Encounter (Signed)
Noted.  We need to wait for all the studies to come in.  We will see her back after the studies.

## 2019-09-23 ENCOUNTER — Ambulatory Visit
Admission: RE | Admit: 2019-09-23 | Discharge: 2019-09-23 | Disposition: A | Discharge status: Home / Self Care | Payer: Medicaid Other | Source: Ambulatory Visit | Attending: Orthopedic Surgery | Admitting: Orthopedic Surgery

## 2019-09-23 DIAGNOSIS — M79602 Pain in left arm: Secondary | ICD-10-CM

## 2019-09-27 ENCOUNTER — Ambulatory Visit (INDEPENDENT_AMBULATORY_CARE_PROVIDER_SITE_OTHER): Payer: Self-pay | Admitting: Orthopedic Surgery

## 2019-09-27 DIAGNOSIS — M79602 Pain in left arm: Secondary | ICD-10-CM

## 2019-09-27 DIAGNOSIS — R519 Headache, unspecified: Secondary | ICD-10-CM

## 2019-10-01 ENCOUNTER — Encounter: Payer: Self-pay | Admitting: Orthopedic Surgery
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
## 2019-10-02 ENCOUNTER — Encounter: Payer: Self-pay | Admitting: Obstetrics & Gynecology

## 2019-10-02 ENCOUNTER — Other Ambulatory Visit: Payer: Self-pay

## 2019-10-02 ENCOUNTER — Ambulatory Visit (INDEPENDENT_AMBULATORY_CARE_PROVIDER_SITE_OTHER): Payer: Self-pay | Admitting: Obstetrics & Gynecology

## 2019-10-02 VITALS — BP 129/83 | HR 89 | Wt 315.0 lb

## 2019-10-02 DIAGNOSIS — D680 Von Willebrand's disease: Secondary | ICD-10-CM

## 2019-10-02 DIAGNOSIS — D6801 Von willebrand disease, type 1: Secondary | ICD-10-CM

## 2019-10-02 DIAGNOSIS — N939 Abnormal uterine and vaginal bleeding, unspecified: Secondary | ICD-10-CM

## 2019-10-02 NOTE — Patient Instructions (Signed)
Return to clinic for any scheduled appointments or for any gynecologic concerns as needed.    Abnormal Uterine Bleeding Abnormal uterine bleeding is unusual bleeding from the uterus. It includes:  Bleeding or spotting between periods.  Bleeding after sex.  Bleeding that is heavier than normal.  Periods that last longer than usual.  Bleeding after menopause. Abnormal uterine bleeding can affect women at various stages in life, including teenagers, women in their reproductive years, pregnant women, and women who have reached menopause. Common causes of abnormal uterine bleeding include:  Pregnancy.  Growths of tissue (polyps).  A noncancerous tumor in the uterus (fibroid).  Infection.  Cancer.  Hormonal imbalances. Any type of abnormal bleeding should be evaluated by a health care provider. Many cases are minor and simple to treat, while others are more serious. Treatment will depend on the cause of the bleeding. Follow these instructions at home:  Monitor your condition for any changes.  Do not use tampons, douche, or have sex if told by your health care provider.  Change your pads often.  Get regular exams that include pelvic exams and cervical cancer screening.  Keep all follow-up visits as told by your health care provider. This is important. Contact a health care provider if:  Your bleeding lasts for more than one week.  You feel dizzy at times.  You feel nauseous or you vomit. Get help right away if:  You pass out.  Your bleeding soaks through a pad every hour.  You have abdominal pain.  You have a fever.  You become sweaty or weak.  You pass large blood clots from your vagina. Summary  Abnormal uterine bleeding is unusual bleeding from the uterus.  Any type of abnormal bleeding should be evaluated by a health care provider. Many cases are minor and simple to treat, while others are more serious.  Treatment will depend on the cause of the  bleeding. This information is not intended to replace advice given to you by your health care provider. Make sure you discuss any questions you have with your health care provider. Document Revised: 04/14/2017 Document Reviewed: 02/07/2016 Elsevier Patient Education  2020 [employer].

## 2019-10-02 NOTE — Progress Notes (Signed)
GYNECOLOGY OFFICE VISIT NOTE  History:   Taylor Flowers is a 29 y.o. [id number] here today for evaluation of abnormal uterine bleeding in the setting of known VWD.  Had D&C for early MAB in 02/2019 and had bleeding for one month.  Then had normal periods, but had another episode of prolonged AUB for one month between 07/2019 and 08/2019.  No further AUB currently.  She is concerned about that prolonged AUB, history of menometrorrhagia in the past.  She denies any current abnormal vaginal discharge, bleeding, pelvic pain or other concerns.    Past Medical History:  Diagnosis Date  . Anemia   . Anxiety    no meds  . Depression    no meds  . History of MRSA infection 2012   buttock  . Hypertension   . 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 Iron deficiency anemia due to chronic blood loss 02/11/2015  . Menometrorrhagia 02/11/2015  . Metacarpal bone fracture 12/2013   left small  . Migraines    "a few times/yr" (01/07/2015)  . Miscarriage   . Post-tonsillectomy hemorrhage 01/07/2015  . Premature rupture of membranes in pregnancy, antepartum-at 17 wks 01/21/2013  . PTSD (post-traumatic stress disorder)    no meds  . Short cervix with cervical cerclage in second trimester, antepartum 08/13/2013  . Spinal headache   . Spontaneous abortion in second trimester 08/23/2013  . Von Willebrand disease (HCC)   . Von Willebrand disease, type 1a (HCC) 02/11/2015    Past Surgical History:  Procedure Laterality Date  . CERVICAL CERCLAGE N/A 06/13/2013   Procedure: CERCLAGE CERVICAL;  Surgeon: Kathreen Cosier, MD;  Location: [facility];  Service: Gynecology;  Laterality: N/A;  . CLOSED REDUCTION METACARPAL WITH PERCUTANEOUS PINNING Left 12/25/2013   Procedure: LEFT SMALL METACARPAL CLOSED REDUCTION PERCUTANEOUS PINNING ;  Surgeon: Betha Loa, MD;  Location: [facility];  Service: Orthopedics;  Laterality: Left;  . DILATION AND CURETTAGE OF UTERUS N/A 08/23/2013   Procedure: Dilatation and Currettage  with Ultrasound Guidance;  Surgeon: Kathreen Cosier, MD;  Location: [facility];  Service: Gynecology;  Laterality: N/A;  . DILATION AND CURETTAGE OF UTERUS  06/2007  . DILATION AND EVACUATION N/A 01/24/2013   Procedure: DILATATION AND EVACUATION;  Surgeon: Catalina Antigua, MD;  Location: [facility];  Service: Gynecology;  Laterality: N/A;  . DILATION AND EVACUATION N/A 09/11/2014   Procedure: DILATATION AND EVACUATION;  Surgeon: Lavina Hamman, MD;  Location: [facility];  Service: Gynecology;  Laterality: N/A;  . DILATION AND EVACUATION N/A 02/24/2019   Procedure: DILATATION AND EVACUATION;  Surgeon: Tereso Newcomer, MD;  Location: [facility];  Service: Gynecology;  Laterality: N/A;  . FRACTURE SURGERY    . TONSILLECTOMY N/A 12/30/2014   Procedure: CONTROL OF POST TONSILLAR BLEED;  Surgeon: Osborn Coho, MD;  Location: [facility];  Service: ENT;  Laterality: N/A;  . TONSILLECTOMY  01/07/2015   control of post tonsillar bleed  . TONSILLECTOMY N/A 01/07/2015   Procedure: CONTROL TONSIL BLEED;  Surgeon: Melvenia Beam, MD;  [facility];  Service: ENT;  Laterality: N/A;  . TONSILLECTOMY AND ADENOIDECTOMY N/A 12/20/2014   Procedure: CONTROL TONSIL  BLEED;  Surgeon: Flo Shanks, MD;  Location: [facility] OR;  Service: ENT;  Laterality: N/A;    The following portions of the patient's history were reviewed and updated as appropriate: allergies, current medications, past family history, past medical history, past social history, past surgical history and problem list.   Review of Systems:  Pertinent  items noted in HPI and remainder of comprehensive ROS otherwise negative.  Physical Exam:  BP 129/83   Pulse 89   Wt (!) 315 lb (142.9 kg)   LMP 07/27/2019   BMI 42.72 kg/m  CONSTITUTIONAL: Well-developed, well-nourished female in no acute distress.  HEENT:  Normocephalic, atraumatic. External right and left ear normal. No scleral icterus.  NECK: Normal range of motion, supple, no masses noted on observation SKIN: No  rash noted. Not diaphoretic. No erythema. No pallor. MUSCULOSKELETAL: Normal range of motion. No edema noted. NEUROLOGIC: Alert and oriented to person, place, and time. Normal muscle tone coordination. No cranial nerve deficit noted. PSYCHIATRIC: Normal mood and affect. Normal behavior. Normal judgment and thought content. CARDIOVASCULAR: Normal heart rate noted RESPIRATORY: Effort and breath sounds normal, no problems with respiration noted ABDOMEN: No masses noted. No other overt distention noted.   PELVIC: Deferred  Labs and Imaging CBC Latest Ref Rng & Units 08/26/2019 02/24/2019 02/18/2019  WBC 4.0 - 10.5 K/uL 6.3 12.4(H) 11.5(H)  Hemoglobin 12.0 - 15.0 g/dL 15.3(H) 15.6(H) 14.9  Hematocrit 36 - 46 % 46.7(H) 46.9(H) 43.2  Platelets 150 - 400 K/uL 284 293 292       Assessment and Plan:      1. Abnormal uterine bleeding (AUB) 2. Von Willebrand disease, type 1a (HCC) 3. Morbid obesity (HCC) AUB is likely anovulatory bleeding due to hormonal fluctuations, previous OB ultrasounds did not show fibroids.  GYN ultrasound ordered to evaluate for possible structural pathology.  Given morbid obesity, discussed concern about precancerous/hyperplasia or cancerous etiology; discussed role of unopposed estrogen exposure in leading to thickened or proliferative endometrium; and its possible correlation with endometrial hyperplasia/carcinoma.  Discussed that obesity is linked to endometrial pathology given that adipose cells produce extra estrogen (estrone) which can cause the endometrium to have a significant amount of estrogen exposure.  Depending on ultrasound results and if she has further AUB, endometrial sampling could be indicated.  Labs ordered for today, will follow up results and manage accordingly. - US PELVIC COMPLETE WITH TRANSVAGINAL; Future - TSH - CBC Discussed the her VWD also exagerrates any bleeding.  Management options including progestin therapy (IUD preferred over oral) vs surgery  briefly discussed, will discuss more after ultrasound. Patient will consider options, and let us know if abnormal bleeding recurs.   If AUB recurs, endometrial sampling is also indicated. Routine preventative health maintenance measures emphasized. Please refer to After Visit Summary for other counseling recommendations.   Return for any gynecologic concerns.    Total face-to-face time with patient: 20 minutes.  Over 50% of encounter was spent on counseling and coordination of care.   Jaynie Collins, MD, FACOG Obstetrician & Gynecologist, [place] for [employer], [facility]

## 2019-10-03 LAB — CBC
Hematocrit: 44 % (ref 34.0–46.6)
Hemoglobin: 15.5 g/dL (ref 11.1–15.9)
MCH: 31.3 pg (ref 26.6–33.0)
MCHC: 35.2 g/dL (ref 31.5–35.7)
MCV: 89 fL (ref 79–97)
Platelets: 256 10*3/uL (ref 150–450)
RBC: 4.96 x10E6/uL (ref 3.77–5.28)
RDW: 12.4 % (ref 11.7–15.4)
WBC: 6.5 10*3/uL (ref 3.4–10.8)

## 2019-10-03 LAB — T3, FREE: T3, Free: 3 pg/mL (ref 2.0–4.4)

## 2019-10-03 LAB — TSH: TSH: 0.245 u[IU]/mL — ABNORMAL LOW (ref 0.450–4.500)

## 2019-10-03 LAB — T4, FREE: Free T4: 1.15 ng/dL (ref 0.82–1.77)

## 2019-10-03 LAB — SPECIMEN STATUS REPORT

## 2019-10-09 ENCOUNTER — Ambulatory Visit
Admission: RE | Admit: 2019-10-09 | Discharge: 2019-10-09 | Disposition: A | Discharge status: Home / Self Care | Payer: Medicaid Other | Source: Ambulatory Visit | Attending: Obstetrics & Gynecology | Admitting: Obstetrics & Gynecology

## 2019-10-09 ENCOUNTER — Other Ambulatory Visit: Payer: Self-pay | Admitting: Obstetrics & Gynecology

## 2019-10-09 ENCOUNTER — Other Ambulatory Visit: Payer: Self-pay

## 2019-10-09 DIAGNOSIS — N939 Abnormal uterine and vaginal bleeding, unspecified: Secondary | ICD-10-CM

## 2019-10-09 DIAGNOSIS — D6801 Von willebrand disease, type 1: Secondary | ICD-10-CM

## 2019-10-09 DIAGNOSIS — D680 Von Willebrand's disease: Secondary | ICD-10-CM | POA: Insufficient documentation

## 2019-10-10 NOTE — Telephone Encounter (Signed)
Is this resolved?

## 2019-10-10 NOTE — Telephone Encounter (Signed)
Holding for LF

## 2019-10-12 ENCOUNTER — Ambulatory Visit
Admission: RE | Admit: 2019-10-12 | Discharge: 2019-10-12 | Disposition: A | Discharge status: Home / Self Care | Payer: Medicaid Other | Source: Ambulatory Visit | Attending: Surgical | Admitting: Surgical

## 2019-10-12 DIAGNOSIS — M79602 Pain in left arm: Secondary | ICD-10-CM

## 2019-10-12 MED ORDER — IOPAMIDOL (ISOVUE-M 200) INJECTION 41%
2.0000 mL | Freq: Once | INTRAMUSCULAR | Status: AC
  Administered 2019-10-12: 2 mL via INTRA_ARTICULAR

## 2019-10-20 ENCOUNTER — Other Ambulatory Visit: Payer: Self-pay

## 2019-10-20 ENCOUNTER — Ambulatory Visit (INDEPENDENT_AMBULATORY_CARE_PROVIDER_SITE_OTHER): Payer: Self-pay | Admitting: Physical Medicine and Rehabilitation

## 2019-10-20 DIAGNOSIS — R202 Paresthesia of skin: Secondary | ICD-10-CM

## 2019-10-20 NOTE — Progress Notes (Signed)
Pt state left hand pain. Pt state she has an injury to her left hand. Pt is right handed. Pt state cooking or over use of the left hand. Pt state she feels tingling from her left hand up to her left arm. Pt state she has to lift her arm and hand up to feel some relief. Her pinky will starte twitching sometime.  Numeric Pain Rating Scale and Functional Assessment Average Pain 4   In the last MONTH (on 0-10 scale) has pain interfered with the following?  1. General activity like being  able to carry out your everyday physical activities such as walking, climbing stairs, carrying groceries, or moving a chair?  Rating(8)

## 2019-10-23 ENCOUNTER — Ambulatory Visit (INDEPENDENT_AMBULATORY_CARE_PROVIDER_SITE_OTHER): Payer: Medicaid Other | Admitting: Orthopedic Surgery

## 2019-10-23 ENCOUNTER — Encounter: Payer: Self-pay | Admitting: Orthopedic Surgery

## 2019-10-23 DIAGNOSIS — M79602 Pain in left arm: Secondary | ICD-10-CM

## 2019-10-23 NOTE — Procedures (Signed)
EMG & NCV Findings: All nerve conduction studies (as indicated in the following tables) were within normal limits.    All examined muscles (as indicated in the following table) showed no evidence of electrical instability.    Impression: Essentially NORMAL electrodiagnostic study of the left upper limb.  There is no significant electrodiagnostic evidence of.  As you know, purely sensory or demyelinating radiculopathies and chemical radiculitis may not be detected with this particular electrodiagnostic study.  Recommendations: 1.  Follow-up with referring physician. 2.  Continue current management of symptoms.  ___________________________ Naaman Plummer FAAPMR Board Certified, American Board of Physical Medicine and Rehabilitation    Nerve Conduction Studies Anti Sensory Summary Table   Stim Site NR Peak (ms) Norm Peak (ms) P-T Amp (V) Norm P-T Amp Site1 Site2 Delta-P (ms) Dist (cm) Vel (m/s) Norm Vel (m/s)  Left Median Acr Palm Anti Sensory (2nd Digit)  30.5C  Wrist    3.3 <3.6 15.1 >10 Wrist Palm 1.5 0.0    Palm    1.8 <2.0 6.6         Left Radial Anti Sensory (Base 1st Digit)  30.1C  Wrist    2.2 <3.1 17.0  Wrist Base 1st Digit 2.2 0.0    Left Ulnar Anti Sensory (5th Digit)  30.7C  Wrist    3.4 <3.7 24.2 >15.0 Wrist 5th Digit 3.4 14.0 41 >38   Motor Summary Table   Stim Site NR Onset (ms) Norm Onset (ms) O-P Amp (mV) Norm O-P Amp Site1 Site2 Delta-0 (ms) Dist (cm) Vel (m/s) Norm Vel (m/s)  Left Median Motor (Abd Poll Brev)  30.5C  Wrist    3.4 <4.2 10.6 >5 Elbow Wrist 4.7 24.5 52 >50  Elbow    8.1  7.7         Left Ulnar Motor (Abd Dig Min)  30.3C  Wrist    3.1 <4.2 9.8 >3 B Elbow Wrist 4.5 24.0 53 >53  B Elbow    7.6  7.9  A Elbow B Elbow 1.7 11.0 65 >53  A Elbow    9.3  9.3          EMG   Side Muscle Nerve Root Ins Act Fibs Psw Amp Dur Poly Recrt Int Dennie Bible Comment  Left Abd Poll Brev Median C8-T1 Nml Nml Nml Nml Nml 0 Nml Nml   Left 1stDorInt Ulnar C8-T1 Nml Nml Nml  Nml Nml 0 Nml Nml   Left PronatorTeres Median C6-7 Nml Nml Nml Nml Nml 0 Nml Nml   Left Biceps Musculocut C5-6 Nml Nml Nml Nml Nml 0 Nml Nml   Left Deltoid Axillary C5-6 Nml Nml Nml Nml Nml 0 Nml Nml     Nerve Conduction Studies Anti Sensory Left/Right Comparison   Stim Site L Lat (ms) R Lat (ms) L-R Lat (ms) L Amp (V) R Amp (V) L-R Amp (%) Site1 Site2 L Vel (m/s) R Vel (m/s) L-R Vel (m/s)  Median Acr Palm Anti Sensory (2nd Digit)  30.5C  Wrist 3.3   15.1   Wrist Palm     Palm 1.8   6.6         Radial Anti Sensory (Base 1st Digit)  30.1C  Wrist 2.2   17.0   Wrist Base 1st Digit     Ulnar Anti Sensory (5th Digit)  30.7C  Wrist 3.4   24.2   Wrist 5th Digit 41     Motor Left/Right Comparison   Stim Site L Lat (ms) R Lat (ms)  L-R Lat (ms) L Amp (mV) R Amp (mV) L-R Amp (%) Site1 Site2 L Vel (m/s) R Vel (m/s) L-R Vel (m/s)  Median Motor (Abd Poll Brev)  30.5C  Wrist 3.4   10.6   Elbow Wrist 52    Elbow 8.1   7.7         Ulnar Motor (Abd Dig Min)  30.3C  Wrist 3.1   9.8   B Elbow Wrist 53    B Elbow 7.6   7.9   A Elbow B Elbow 65    A Elbow 9.3   9.3            Waveforms:

## 2019-10-23 NOTE — Progress Notes (Signed)
Taylor Flowers - 29 y.o. female MRN [id number]  Date of birth: [birth year]  Office Visit Note: Visit Date: 10/20/2019 PCP: Verlon Au, MD Referred by: Verlon Au, MD  Subjective: No chief complaint on file.  HPI:  [name] is a 29 y.o. female who comes in today at the request of Dr. Burnard Bunting for electrodiagnostic study of the Left upper extremities.  Patient is Right hand dominant.  Reviewing notes from Dr. Burnard Bunting show multiple hand fractures.  She reports increasing pain with overuse of the hand or using it for cooking etc.  She does report tingling numbness from her left hand up to her left arm and somewhat of a nondermatomal fashion.  She'll have to move her arm and lift her arm around to get it to feel better at times.  She reports a twitching sensation in the fifth digit.  She reports no right-sided complaints.  She has had no prior electrodiagnostic study.  ROS Otherwise per HPI.  Assessment & Plan: Visit Diagnoses:  1. Paresthesia of skin     Plan: Impression: Essentially NORMAL electrodiagnostic study of the left upper limb.  There is no significant electrodiagnostic evidence of.  As you know, purely sensory or demyelinating radiculopathies and chemical radiculitis may not be detected with this particular electrodiagnostic study.  Recommendations: 1.  Follow-up with referring physician. 2.  Continue current management of symptoms.  Meds & Orders: No orders of the defined types were placed in this encounter.   Orders Placed This Encounter  Procedures  . NCV with EMG (electromyography)    Follow-up: Return for G. Dorene Grebe, MD.   Procedures: No procedures performed  EMG & NCV Findings: All nerve conduction studies (as indicated in the following tables) were within normal limits.    All examined muscles (as indicated in the following table) showed no evidence of electrical instability.    Impression: Essentially NORMAL  electrodiagnostic study of the left upper limb.  There is no significant electrodiagnostic evidence of.  As you know, purely sensory or demyelinating radiculopathies and chemical radiculitis may not be detected with this particular electrodiagnostic study.  Recommendations: 1.  Follow-up with referring physician. 2.  Continue current management of symptoms.  ___________________________ Naaman Plummer FAAPMR Board Certified, American Board of Physical Medicine and Rehabilitation    Nerve Conduction Studies Anti Sensory Summary Table   Stim Site NR Peak (ms) Norm Peak (ms) P-T Amp (V) Norm P-T Amp Site1 Site2 Delta-P (ms) Dist (cm) Vel (m/s) Norm Vel (m/s)  Left Median Acr Palm Anti Sensory (2nd Digit)  30.5C  Wrist    3.3 <3.6 15.1 >10 Wrist Palm 1.5 0.0    Palm    1.8 <2.0 6.6         Left Radial Anti Sensory (Base 1st Digit)  30.1C  Wrist    2.2 <3.1 17.0  Wrist Base 1st Digit 2.2 0.0    Left Ulnar Anti Sensory (5th Digit)  30.7C  Wrist    3.4 <3.7 24.2 >15.0 Wrist 5th Digit 3.4 14.0 41 >38   Motor Summary Table   Stim Site NR Onset (ms) Norm Onset (ms) O-P Amp (mV) Norm O-P Amp Site1 Site2 Delta-0 (ms) Dist (cm) Vel (m/s) Norm Vel (m/s)  Left Median Motor (Abd Poll Brev)  30.5C  Wrist    3.4 <4.2 10.6 >5 Elbow Wrist 4.7 24.5 52 >50  Elbow    8.1  7.7  Left Ulnar Motor (Abd Dig Min)  30.3C  Wrist    3.1 <4.2 9.8 >3 B Elbow Wrist 4.5 24.0 53 >53  B Elbow    7.6  7.9  A Elbow B Elbow 1.7 11.0 65 >53  A Elbow    9.3  9.3          EMG   Side Muscle Nerve Root Ins Act Fibs Psw Amp Dur Poly Recrt Int Dennie Bible Comment  Left Abd Poll Brev Median C8-T1 Nml Nml Nml Nml Nml 0 Nml Nml   Left 1stDorInt Ulnar C8-T1 Nml Nml Nml Nml Nml 0 Nml Nml   Left PronatorTeres Median C6-7 Nml Nml Nml Nml Nml 0 Nml Nml   Left Biceps Musculocut C5-6 Nml Nml Nml Nml Nml 0 Nml Nml   Left Deltoid Axillary C5-6 Nml Nml Nml Nml Nml 0 Nml Nml     Nerve Conduction Studies Anti Sensory Left/Right  Comparison   Stim Site L Lat (ms) R Lat (ms) L-R Lat (ms) L Amp (V) R Amp (V) L-R Amp (%) Site1 Site2 L Vel (m/s) R Vel (m/s) L-R Vel (m/s)  Median Acr Palm Anti Sensory (2nd Digit)  30.5C  Wrist 3.3   15.1   Wrist Palm     Palm 1.8   6.6         Radial Anti Sensory (Base 1st Digit)  30.1C  Wrist 2.2   17.0   Wrist Base 1st Digit     Ulnar Anti Sensory (5th Digit)  30.7C  Wrist 3.4   24.2   Wrist 5th Digit 41     Motor Left/Right Comparison   Stim Site L Lat (ms) R Lat (ms) L-R Lat (ms) L Amp (mV) R Amp (mV) L-R Amp (%) Site1 Site2 L Vel (m/s) R Vel (m/s) L-R Vel (m/s)  Median Motor (Abd Poll Brev)  30.5C  Wrist 3.4   10.6   Elbow Wrist 52    Elbow 8.1   7.7         Ulnar Motor (Abd Dig Min)  30.3C  Wrist 3.1   9.8   B Elbow Wrist 53    B Elbow 7.6   7.9   A Elbow B Elbow 65    A Elbow 9.3   9.3            Waveforms:             Clinical History: No specialty comments available.     Objective:  VS:  HT:    WT:   BMI:     BP:   HR: bpm  TEMP: ( )  RESP:  Physical Exam Musculoskeletal:        General: No swelling, tenderness or deformity.     Comments: Inspection reveals no atrophy of the bilateral APB or FDI or hand intrinsics. There is no swelling, color changes, allodynia or dystrophic changes. There is 5 out of 5 strength in the bilateral wrist extension, finger abduction and long finger flexion. There is intact sensation to light touch in all dermatomal and peripheral nerve distributions. There is a negative Hoffmann's test bilaterally.  Skin:    General: Skin is warm and dry.     Findings: No erythema or rash.  Neurological:     General: No focal deficit present.     Mental Status: She is alert and oriented to person, place, and time.     Motor: No weakness or abnormal muscle tone.  Coordination: Coordination normal.  Psychiatric:        Mood and Affect: Mood normal.        Behavior: Behavior normal.      Imaging: No results found.

## 2019-10-24 ENCOUNTER — Encounter: Payer: Self-pay | Admitting: Physical Medicine and Rehabilitation

## 2019-10-24 NOTE — Telephone Encounter (Signed)
Neg l   - ast rec was to see hand surgeon

## 2019-10-25 ENCOUNTER — Encounter: Payer: Self-pay | Admitting: Orthopedic Surgery

## 2019-10-25 NOTE — Progress Notes (Signed)
Office Visit Note   Patient: Taylor Flowers           Date of Birth: April 13, 1990           MRN: [id number] Visit Date: 10/23/2019 Requested by: Verlon Au, MD [address] [place],  [place] [address] PCP: Verlon Au, MD  Subjective: Chief Complaint  Patient presents with  . Left Wrist - Pain, Follow-up  . scan reveiw    HPI: Taylor Flowers is a 29 year old patient with left hand pain.  She is wearing his compression sleeve on the hand to help her symptoms.  She had an old metacarpal fracture treated with pin fixation.  Has had some pain since that time.  Shooting type pain.  Does not take any medication for this problem because "none of them help".  She has had a CT scan which was normal.  She also had a nerve conduction study which was normal and those are reviewed today.              ROS: All systems reviewed are negative as they relate to the chief complaint within the history of present illness.  Patient denies  fevers or chills.   Assessment & Plan: Visit Diagnoses:  1. Left arm pain     Plan: Impression is left arm and hand pain.  We really worked up this wrist to the best of my ability.  Describes all symptoms starting after pin fixation for the metacarpal fracture.  It is conceivable that some of the sensory nerves may be tied up in some scar tissue but that something that we will really have to be flushed out by the new onset examination of the fellowship trained hand surgeon.  I will see any operative problem in the wrist at this time.  Do not see any type of metacarpal or carpometacarpal instability in the wrist.  Follow-up with [place] as needed.  Follow-Up Instructions: Return if symptoms worsen or fail to improve.   Orders:  No orders of the defined types were placed in this encounter.  No orders of the defined types were placed in this encounter.     Procedures: No procedures performed   Clinical Data: No additional  findings.  Objective: Vital Signs: There were no vitals taken for this visit.  Physical Exam:   Constitutional: Patient appears well-developed HEENT:  Head: Normocephalic Eyes:EOM are normal Neck: Normal range of motion Cardiovascular: Normal rate Pulmonary/chest: Effort normal Neurologic: Patient is alert Skin: Skin is warm Psychiatric: Patient has normal mood and affect    Ortho Exam: Ortho exam demonstrates pretty reasonable grip strength.  Do not detect any instability at the carpometacarpal alignment on the ulnar side of the wrist.  Wrist flexion extension is intact.  No rotational deformity of the fourth or fifth finger.  Negative Tinel's cubital tunnel.  Specialty Comments:  No specialty comments available.  Imaging: No results found.   PMFS History: Patient Active Problem List   Diagnosis Date Noted  . History of recurrent miscarriages 02/24/2019  . Von Willebrand disease, type 1a (HCC) 02/11/2015  . Iron deficiency anemia due to chronic blood loss 02/11/2015  . Menometrorrhagia 02/11/2015  . Post-tonsillectomy hemorrhage 01/07/2015   Past Medical History:  Diagnosis Date  . Anemia   . Anxiety    no meds  . Depression    no meds  . History of MRSA infection 2012   buttock  . Hypertension   . 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  Iron deficiency anemia due to chronic blood loss 02/11/2015  . Menometrorrhagia 02/11/2015  . Metacarpal bone fracture 12/2013   left small  . Migraines    "a few times/yr" (01/07/2015)  . Miscarriage   . Post-tonsillectomy hemorrhage 01/07/2015  . Premature rupture of membranes in pregnancy, antepartum-at 17 wks 01/21/2013  . PTSD (post-traumatic stress disorder)    no meds  . Short cervix with cervical cerclage in second trimester, antepartum 08/13/2013  . Spinal headache   . Spontaneous abortion in second trimester 08/23/2013  . Von Willebrand disease (HCC)   . Von Willebrand disease, type 1a (HCC) 02/11/2015    Family  History  Problem Relation Age of Onset  . Hypertension Mother   . Hypertension Father   . Diabetes Father   . Vision loss Father     Past Surgical History:  Procedure Laterality Date  . CERVICAL CERCLAGE N/A 06/13/2013   Procedure: CERCLAGE CERVICAL;  Surgeon: Kathreen Cosier, MD;  Location: [facility];  Service: Gynecology;  Laterality: N/A;  . CLOSED REDUCTION METACARPAL WITH PERCUTANEOUS PINNING Left 12/25/2013   Procedure: LEFT SMALL METACARPAL CLOSED REDUCTION PERCUTANEOUS PINNING ;  Surgeon: Betha Loa, MD;  Location: [facility];  Service: Orthopedics;  Laterality: Left;  . DILATION AND CURETTAGE OF UTERUS N/A 08/23/2013   Procedure: Dilatation and Currettage with Ultrasound Guidance;  Surgeon: Kathreen Cosier, MD;  Location: [facility];  Service: Gynecology;  Laterality: N/A;  . DILATION AND CURETTAGE OF UTERUS  06/2007  . DILATION AND EVACUATION N/A 01/24/2013   Procedure: DILATATION AND EVACUATION;  Surgeon: Catalina Antigua, MD;  Location: [facility];  Service: Gynecology;  Laterality: N/A;  . DILATION AND EVACUATION N/A 09/11/2014   Procedure: DILATATION AND EVACUATION;  Surgeon: Lavina Hamman, MD;  Location: [facility];  Service: Gynecology;  Laterality: N/A;  . DILATION AND EVACUATION N/A 02/24/2019   Procedure: DILATATION AND EVACUATION;  Surgeon: Tereso Newcomer, MD;  Location: [facility];  Service: Gynecology;  Laterality: N/A;  . FRACTURE SURGERY    . TONSILLECTOMY N/A 12/30/2014   Procedure: CONTROL OF POST TONSILLAR BLEED;  Surgeon: Osborn Coho, MD;  Location[facility];  Service: ENT;  Laterality: N/A;  . TONSILLECTOMY  01/07/2015   control of post tonsillar bleed  . TONSILLECTOMY N/A 01/07/2015   Procedure: CONTROL TONSIL BLEED;  Surgeon: Melvenia Beam, MD;  Location: [facility] OR;  Service: ENT;  Laterality: N/A;  . TONSILLECTOMY AND ADENOIDECTOMY N/A 12/20/2014   Procedure: CONTROL TONSIL  BLEED;  Surgeon: Flo Shanks, MD;  Location: [facility] OR;  Service: ENT;  Laterality: N/A;    Social History   Occupational History  . Not on file  Tobacco Use  . Smoking status: Former Smoker    Years: 1.00    Types: Cigars    Quit date: 01/2019    Years since quitting: 0.7  . Smokeless tobacco: Never Used  Vaping Use  . Vaping Use: Never used  Substance and Sexual Activity  . Alcohol use: Not Currently    Alcohol/week: 0.0 standard drinks    Comment:  none since 01/2019 when pt found out she was pregnant  . Drug use: Not Currently    Frequency: 7.0 times per week    Types: Marijuana    Comment: Last use 01/2019 when found out she was pregnant  . Sexual activity: Not Currently    Birth control/protection: None    Comment: approx [redacted] wks gestation

## 2019-12-06 ENCOUNTER — Encounter: Payer: Self-pay | Admitting: Diagnostic Neuroimaging

## 2019-12-06 ENCOUNTER — Ambulatory Visit: Payer: Self-pay | Admitting: Diagnostic Neuroimaging

## 2019-12-06 VITALS — BP 131/83 | HR 87 | Ht 72.0 in | Wt 313.0 lb

## 2019-12-06 DIAGNOSIS — G43109 Migraine with aura, not intractable, without status migrainosus: Secondary | ICD-10-CM
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
## 2019-12-06 NOTE — Patient Instructions (Signed)
To prevent or relieve headaches, try the following:   Cool Compress. Lie down and place a cool compress on your head.   Avoid headache triggers. If certain foods or odors seem to have triggered your migraines in the past, avoid them. A headache diary might help you identify triggers.   Include physical activity in your daily routine.   Manage stress. Find healthy ways to cope with the stressors, such as delegating tasks on your to-do list.   Practice relaxation techniques. Try deep breathing, yoga, massage and visualization.   Eat regularly. Eating regularly scheduled meals and maintaining a healthy diet might help prevent headaches. Also, drink plenty of fluids.   Follow a regular sleep schedule. Sleep deprivation might contribute to headaches  Consider biofeedback. With this mind-body technique, you learn to control certain bodily functions -- such as muscle tension, heart rate and blood pressure -- to prevent headaches or reduce headache pain.  

## 2020-03-19 DIAGNOSIS — Z0271 Encounter for disability determination: Secondary | ICD-10-CM
